# Patient Record
Sex: Female | Born: 1945
Health system: Southern US, Community
[De-identification: ages and names within clinical notes are randomized; demographics above are authoritative.]

## PROBLEM LIST (undated history)

## (undated) DIAGNOSIS — R0789 Other chest pain: Secondary | ICD-10-CM

## (undated) DIAGNOSIS — R011 Cardiac murmur, unspecified: Secondary | ICD-10-CM

## (undated) DIAGNOSIS — T7840XA Allergy, unspecified, initial encounter: Secondary | ICD-10-CM

## (undated) DIAGNOSIS — R911 Solitary pulmonary nodule: Secondary | ICD-10-CM

## (undated) DIAGNOSIS — G43909 Migraine, unspecified, not intractable, without status migrainosus: Secondary | ICD-10-CM

## (undated) DIAGNOSIS — M8580 Other specified disorders of bone density and structure, unspecified site: Secondary | ICD-10-CM

## (undated) HISTORY — DX: Allergy, unspecified, initial encounter: T78.40XA

## (undated) HISTORY — DX: Other specified disorders of bone density and structure, unspecified site: M85.80

## (undated) HISTORY — DX: Cardiac murmur, unspecified: R01.1

## (undated) HISTORY — PX: COLONOSCOPY: SHX174

## (undated) HISTORY — DX: Migraine, unspecified, not intractable, without status migrainosus: G43.909

---

## 1998-12-20 ENCOUNTER — Other Ambulatory Visit: Admission: RE | Admit: 1998-12-20 | Discharge: 1998-12-20 | Payer: Self-pay | Admitting: Obstetrics and Gynecology

## 2000-08-16 ENCOUNTER — Emergency Department (HOSPITAL_COMMUNITY): Admission: EM | Admit: 2000-08-16 | Discharge: 2000-08-16 | Payer: Self-pay | Admitting: Emergency Medicine

## 2000-08-16 ENCOUNTER — Encounter: Payer: Self-pay | Admitting: Emergency Medicine

## 2000-10-29 ENCOUNTER — Encounter: Admission: RE | Admit: 2000-10-29 | Discharge: 2000-10-29 | Payer: Self-pay | Admitting: *Deleted

## 2000-10-29 ENCOUNTER — Encounter: Payer: Self-pay | Admitting: *Deleted

## 2001-03-27 ENCOUNTER — Encounter: Payer: Self-pay | Admitting: Family Medicine

## 2001-03-27 LAB — HM COLONOSCOPY: HM Colonoscopy: NORMAL

## 2001-11-01 ENCOUNTER — Encounter: Payer: Self-pay | Admitting: *Deleted

## 2001-11-01 ENCOUNTER — Encounter: Admission: RE | Admit: 2001-11-01 | Discharge: 2001-11-01 | Payer: Self-pay | Admitting: *Deleted

## 2003-10-15 ENCOUNTER — Encounter: Admission: RE | Admit: 2003-10-15 | Discharge: 2003-10-15 | Payer: Self-pay | Admitting: Family Medicine

## 2005-02-15 ENCOUNTER — Encounter: Admission: RE | Admit: 2005-02-15 | Discharge: 2005-02-15 | Payer: Self-pay | Admitting: Family Medicine

## 2005-02-22 ENCOUNTER — Ambulatory Visit: Payer: Self-pay | Admitting: *Deleted

## 2005-02-24 ENCOUNTER — Ambulatory Visit: Payer: Self-pay

## 2005-03-03 ENCOUNTER — Ambulatory Visit: Payer: Self-pay

## 2005-03-08 ENCOUNTER — Ambulatory Visit: Payer: Self-pay | Admitting: *Deleted

## 2006-01-08 ENCOUNTER — Ambulatory Visit: Payer: Self-pay | Admitting: Family Medicine

## 2006-01-15 ENCOUNTER — Other Ambulatory Visit: Admission: RE | Admit: 2006-01-15 | Discharge: 2006-01-15 | Payer: Self-pay | Admitting: Family Medicine

## 2006-01-15 ENCOUNTER — Encounter: Payer: Self-pay | Admitting: Family Medicine

## 2006-01-15 ENCOUNTER — Ambulatory Visit: Payer: Self-pay | Admitting: Family Medicine

## 2006-09-13 ENCOUNTER — Ambulatory Visit (HOSPITAL_COMMUNITY): Admission: RE | Admit: 2006-09-13 | Discharge: 2006-09-13 | Payer: Self-pay | Admitting: Otolaryngology

## 2008-01-10 ENCOUNTER — Encounter: Admission: RE | Admit: 2008-01-10 | Discharge: 2008-01-10 | Payer: Self-pay | Admitting: Family Medicine

## 2009-02-24 ENCOUNTER — Encounter: Admission: RE | Admit: 2009-02-24 | Discharge: 2009-02-24 | Payer: Self-pay | Admitting: Family Medicine

## 2009-12-31 ENCOUNTER — Encounter: Payer: Self-pay | Admitting: Family Medicine

## 2010-01-13 ENCOUNTER — Ambulatory Visit: Payer: Self-pay | Admitting: Family Medicine

## 2010-01-13 ENCOUNTER — Other Ambulatory Visit: Admission: RE | Admit: 2010-01-13 | Discharge: 2010-01-13 | Payer: Self-pay | Admitting: Family Medicine

## 2010-01-13 DIAGNOSIS — G43109 Migraine with aura, not intractable, without status migrainosus: Secondary | ICD-10-CM | POA: Insufficient documentation

## 2010-01-13 DIAGNOSIS — J309 Allergic rhinitis, unspecified: Secondary | ICD-10-CM | POA: Insufficient documentation

## 2010-01-14 ENCOUNTER — Ambulatory Visit: Payer: Self-pay | Admitting: Family Medicine

## 2010-01-17 ENCOUNTER — Encounter: Payer: Self-pay | Admitting: Family Medicine

## 2010-01-17 LAB — CONVERTED CEMR LAB
AST: 24 units/L (ref 0–37)
Alkaline Phosphatase: 59 units/L (ref 39–117)
BUN: 13 mg/dL (ref 6–23)
Basophils Relative: 0.5 % (ref 0.0–3.0)
CO2: 28 meq/L (ref 19–32)
Cholesterol: 180 mg/dL (ref 0–200)
Creatinine, Ser: 0.5 mg/dL (ref 0.4–1.2)
Glucose, Bld: 82 mg/dL (ref 70–99)
HDL: 54 mg/dL (ref >39.00)
Hemoglobin: 13.9 g/dL (ref 12.0–15.0)
LDL Cholesterol: 115 mg/dL — ABNORMAL HIGH (ref 0–99)
Neutrophils Relative %: 53.9 % (ref 43.0–77.0)
Potassium: 3.9 meq/L (ref 3.5–5.1)
TSH: 1.95 microintl units/mL (ref 0.35–5.50)
VLDL: 11.2 mg/dL (ref 0.0–40.0)

## 2010-01-18 ENCOUNTER — Encounter (INDEPENDENT_AMBULATORY_CARE_PROVIDER_SITE_OTHER): Payer: Self-pay | Admitting: *Deleted

## 2010-01-24 ENCOUNTER — Ambulatory Visit: Payer: Self-pay | Admitting: Family Medicine

## 2010-01-24 ENCOUNTER — Encounter (INDEPENDENT_AMBULATORY_CARE_PROVIDER_SITE_OTHER): Payer: Self-pay | Admitting: *Deleted

## 2010-01-25 LAB — CONVERTED CEMR LAB: Fecal Occult Bld: NEGATIVE

## 2010-03-02 ENCOUNTER — Encounter: Payer: Self-pay | Admitting: Family Medicine

## 2010-03-02 ENCOUNTER — Encounter: Admission: RE | Admit: 2010-03-02 | Discharge: 2010-03-02 | Payer: Self-pay | Admitting: Family Medicine

## 2010-03-02 LAB — HM MAMMOGRAPHY: HM Mammogram: NEGATIVE

## 2010-04-01 ENCOUNTER — Telehealth (INDEPENDENT_AMBULATORY_CARE_PROVIDER_SITE_OTHER): Payer: Self-pay | Admitting: *Deleted

## 2010-10-22 ENCOUNTER — Encounter: Payer: Self-pay | Admitting: Family Medicine

## 2010-10-23 ENCOUNTER — Encounter: Payer: Self-pay | Admitting: Family Medicine

## 2010-11-01 NOTE — Progress Notes (Signed)
Summary: BMD Results  Phone Note Call from Patient Call back at Home Phone [phone]   Caller: Patient Summary of Call: Patient called and left a message on the triage line asking about her t-score from her BMD. She also wanted to know what route she should take for the treatment of her osteroporosis. Please advise.  Initial call taken by: Harold Barban,  April 01, 2010 2:21 PM  Follow-up for Phone Call        see BMD--- options were discussed with pt when she was called with bmd results.   Lumbar spine T score--- -2.0 osteoporosis and in femur -1.4  osteopenia Follow-up by: Loreen Freud DO,  April 04, 2010 9:00 AM  Additional Follow-up for Phone Call Additional follow up Details #1::        left message for pt to call back. Army Fossa CMA  April 05, 2010 10:07 AM     Additional Follow-up for Phone Call Additional follow up Details #2::    Pt states she has researched the opitions and she does not want to do Prolia. She states she is allergic to the CT dye and it states not to take if allergic. She would like to also know what Actonel was the choice over Fosamax, since Fosamax has a generic. Army Fossa CMA  April 06, 2010 12:55 PM   Additional Follow-up for Phone Call Additional follow up Details #3:: Details for Additional Follow-up Action Taken: I've had pt have more problems with fosamax since it is generic----but she can try that one if prefers 70mg  weekly  #4 11 refills also actonel 150 is only 1x a month  Pt decied to use Actonel, this is safe to use with her allergy to the dye correct? Army Fossa CMA  April 06, 2010 1:16 PM  correct  7.6.2011 yrlowne 240p Additional Follow-up by: Loreen Freud DO,  April 06, 2010 12:58 PM  New/Updated Medications: ACTONEL 150 MG TABS (RISEDRONATE SODIUM) 1 by mouth once monthly. Prescriptions: ACTONEL 150 MG TABS (RISEDRONATE SODIUM) 1 by mouth once monthly.  #1 x 11   Entered by:   Army Fossa CMA   Authorized by:    Loreen Freud DO   Signed by:   Army Fossa CMA on 04/06/2010   Method used:   Electronically to        [id number] (retail)       [address]       [place], [place]  [address]       Ph: [phone]       Fax: [phone]   RxID:   [id number]

## 2010-11-01 NOTE — Assessment & Plan Note (Signed)
Summary: NEW PT/BCBS/KDC   Vital Signs:  Patient profile:   65 year old female Height:      66 inches Weight:      169 pounds BMI:     27.38 Pulse rate:   62 / minute Pulse rhythm:   regular BP sitting:   120 / 76  (left arm) Cuff size:   regular  Vitals Entered By: Army Fossa CMA (January 13, 2010 2:14 PM)  History of Present Illness: Pt here for for cpe, and pap---labs to be done another day.    Preventive Screening-Counseling & Management  Alcohol-Tobacco     Alcohol drinks/day: <1     Alcohol type: wine     Smoking Status: never  Caffeine-Diet-Exercise     Caffeine use/day: 3     Does Patient Exercise: yes     Type of exercise: tennis, walk, hike     Exercise (avg: min/session): 30-60     Times/week: 7     Exercise Counseling: not indicated; exercise is adequate  Hep-HIV-STD-Contraception     Dental Visit-last 6 months yes     Dental Care Counseling: not indicated; dental care within six months     SBE monthly: yes     SBE Education/Counseling: not indicated; SBE done regularly      Sexual History:  currently monogamous and married.        Drug Use:  no.    Current Medications (verified): 1)  Allegra 180 Mg Tabs (Fexofenadine Hcl) .Marland Kitchen.. 1 By Mouth Daily 2)  Osteo Bi-Flex Adv Triple St  Tabs (Misc Natural Products) 3)  Fish Oil 4)  Vitamin C 5)  Excedrin Migraine 250-250-65 Mg Tabs (Aspirin-Acetaminophen-Caffeine)  Allergies (verified): 1)  ! * Ivp Dye  Past History:  Family History: Last updated: 01/13/2010 Heart disease--  CHF Colon Cancer Family History of CAD Female 1st degree relative <50  Social History: Last updated: 01/13/2010 Married Never Smoked Alcohol use-yes Drug use-no Regular exercise-yes Occupation: unemployed  Risk Factors: Alcohol Use: <1 (01/13/2010) Caffeine Use: 3 (01/13/2010) Exercise: yes (01/13/2010)  Risk Factors: Smoking Status: never (01/13/2010)  Past Medical History: Heart Murmur   Vertigo Migraines Allergic rhinitis  Family History: Reviewed history and no changes required. Heart disease--  CHF Colon Cancer Family History of CAD Female 1st degree relative <50  Social History: Reviewed history and no changes required. Married Never Smoked Alcohol use-yes Drug use-no Regular exercise-yes Occupation: unemployed Smoking Status:  never Drug Use:  no Does Patient Exercise:  yes Caffeine use/day:  3 Dental Care w/in 6 mos.:  yes Sexual History:  currently monogamous, married Occupation:  employed  Review of Systems      See HPI General:  Denies chills, fatigue, fever, loss of appetite, malaise, sleep disorder, sweats, weakness, and weight loss. Eyes:  Denies blurring, discharge, double vision, eye irritation, eye pain, halos, itching, light sensitivity, red eye, vision loss-1 eye, and vision loss-both eyes; optho- q1y. ENT:  Denies decreased hearing, difficulty swallowing, ear discharge, earache, hoarseness, nasal congestion, nosebleeds, postnasal drainage, ringing in ears, sinus pressure, and sore throat; Dr Jettie Booze frequent om and vertigo. CV:  Denies bluish discoloration of lips or nails, chest pain or discomfort, difficulty breathing at night, difficulty breathing while lying down, fainting, fatigue, leg cramps with exertion, lightheadness, near fainting, palpitations, shortness of breath with exertion, swelling of feet, swelling of hands, and weight gain. Resp:  Denies chest discomfort, chest pain with inspiration, cough, coughing up blood, excessive snoring, hypersomnolence, morning headaches, pleuritic, shortness of  breath, sputum productive, and wheezing. GI:  Denies abdominal pain, bloody stools, change in bowel habits, constipation, dark tarry stools, diarrhea, excessive appetite, gas, hemorrhoids, indigestion, loss of appetite, nausea, vomiting, vomiting blood, and yellowish skin color. GU:  Denies abnormal vaginal bleeding, decreased libido,  discharge, dysuria, genital sores, hematuria, incontinence, nocturia, urinary frequency, and urinary hesitancy. MS:  Denies joint pain, joint redness, joint swelling, loss of strength, low back pain, mid back pain, muscle aches, muscle , cramps, muscle weakness, stiffness, and thoracic pain. Derm:  Denies changes in color of skin, changes in nail beds, dryness, excessive perspiration, flushing, hair loss, insect bite(s), itching, lesion(s), poor wound healing, and rash; derm q1y. Neuro:  Denies brief paralysis, difficulty with concentration, disturbances in coordination, falling down, headaches, inability to speak, memory loss, numbness, poor balance, seizures, sensation of room spinning, tingling, tremors, visual disturbances, and weakness. Psych:  Denies alternate hallucination ( auditory/visual), anxiety, depression, easily angered, easily tearful, irritability, mental problems, panic attacks, sense of great danger, suicidal thoughts/plans, thoughts of violence, unusual visions or sounds, and thoughts /plans of harming others. Endo:  Denies cold intolerance, excessive hunger, excessive thirst, excessive urination, heat intolerance, polyuria, and weight change. Heme:  Denies abnormal bruising, bleeding, enlarge lymph nodes, fevers, pallor, and skin discoloration. Allergy:  Denies hives or rash, itching eyes, persistent infections, seasonal allergies, and sneezing.  Physical Exam  General:  Well-developed,well-nourished,in no acute distress; alert,appropriate and cooperative throughout examination Head:  Normocephalic and atraumatic without obvious abnormalities. No apparent alopecia or balding. Eyes:  pupils equal, pupils round, pupils reactive to light, and no injection.   Ears:  External ear exam shows no significant lesions or deformities.  Otoscopic examination reveals clear canals, tympanic membranes are intact bilaterally without bulging, retraction, inflammation or discharge. Hearing is  grossly normal bilaterally. Nose:  External nasal examination shows no deformity or inflammation. Nasal mucosa are pink and moist without lesions or exudates. Mouth:  Oral mucosa and oropharynx without lesions or exudates.  Teeth in good repair. Neck:  No deformities, masses, or tenderness noted. Chest Wall:  No deformities, masses, or tenderness noted. Breasts:  No mass, nodules, thickening, tenderness, bulging, retraction, inflamation, nipple discharge or skin changes noted.   Lungs:  Normal respiratory effort, chest expands symmetrically. Lungs are clear to auscultation, no crackles or wheezes. Heart:  Normal rate and regular rhythm. S1 and S2 normal without gallop, murmur, click, rub or other extra sounds. Abdomen:  Bowel sounds positive,abdomen soft and non-tender without masses, organomegaly or hernias noted. Rectal:  No external abnormalities noted. Normal sphincter tone. No rectal masses or tenderness. Genitalia:  Pelvic Exam:        External: normal female genitalia without lesions or masses        Vagina: normal without lesions or masses        Cervix: normal without lesions or masses        Adnexa: normal bimanual exam without masses or fullness        Uterus: normal by palpation        Pap smear: performed Msk:  normal ROM, no joint tenderness, no joint swelling, no joint warmth, no redness over joints, no joint deformities, no joint instability, and no crepitation.   Pulses:  R dorsalis pedis normal, R carotid normal, L radial normal, L posterior tibial normal, L dorsalis pedis normal, and L carotid normal.   Extremities:  No clubbing, cyanosis, edema, or deformity noted with normal full range of motion of all joints.   Neurologic:  alert & oriented X3, strength normal in all extremities, and gait normal.   Skin:  Intact without suspicious lesions or rashes Cervical Nodes:  No lymphadenopathy noted Axillary Nodes:  No palpable lymphadenopathy Psych:  Cognition and judgment appear  intact. Alert and cooperative with normal attention span and concentration. No apparent delusions, illusions, hallucinations   Impression & Recommendations:  Problem # 1:  PREVENTIVE HEALTH CARE (ICD-V70.0)  check fasting labs check mammo and bmd ca supplementation sbe FOB  Orders: EKG w/ Interpretation (93000)  Problem # 2:  ALLERGIC RHINITIS (ICD-477.9)  Her updated medication list for this problem includes:    Allegra 180 Mg Tabs (Fexofenadine hcl) .Marland Kitchen... 1 by mouth daily  Complete Medication List: 1)  Allegra 180 Mg Tabs (Fexofenadine hcl) .Marland Kitchen.. 1 by mouth daily 2)  Osteo Bi-flex Adv Triple St Tabs (Misc natural products) 3)  Fish Oil  4)  Vitamin C  5)  Excedrin Migraine 250-250-65 Mg Tabs (Aspirin-acetaminophen-caffeine)  Patient Instructions: 1)  V70.0   fasting labs  cbcd , bmp, lipid hep, tsh,  UA   EKG  Procedure date:  01/13/2010  Findings:      Atrial fibrillation with a controlled ventricular response rate of: 100 later pulse was between 89-91   EKG  Procedure date:  01/13/2010  Findings:      Sinus bradycardia with rate of:    Colonoscopy Result Date:  03/27/2001 Colonoscopy Result:  normal Colonoscopy Next Due:  10 yr      Immunization History:  Tetanus/Td Immunization History:    Tetanus/Td:  Historical (01/17/2006)

## 2010-11-01 NOTE — Miscellaneous (Signed)
Summary: Orders Update   Clinical Lists Changes  Orders: Added new Referral order of Radiology Referral (Radiology) - Signed 

## 2010-11-01 NOTE — Letter (Signed)
Summary: Results Follow up Letter  [facility] [place]  [address] [place], [place] [address]   Phone: [phone]  Fax: [phone]    01/18/2010 MRN: [id number]  [facility] [place] [address] [place], [place]  [address]  Dear Terri Mendoza,  The following are the results of your recent test(s):  Test         Result    Pap Smear:        Normal __X___  Not Normal _____ Comments: ______________________________________________________ Cholesterol: LDL(Bad cholesterol):         Your goal is less than:         HDL (Good cholesterol):       Your goal is more than: Comments:  ______________________________________________________ Mammogram:        Normal _____  Not Normal _____ Comments:  ___________________________________________________________________ Hemoccult:        Normal _____  Not normal _______ Comments:    _____________________________________________________________________ Other Tests:    We routinely do not discuss normal results over the telephone.  If you desire a copy of the results, or you have any questions about this information we can discuss them at your next office visit.   Sincerely,    Army Fossa CMA  January 18, 2010 11:51 AM

## 2010-11-01 NOTE — Letter (Signed)
Summary: [facility]: Immunoassay Fecal Occult Blood (iFOB) Order Form  [facility] [place]  [address] [place], [place] [address]   Phone: [phone]  Fax: [phone]      [facility]: Immunoassay Fecal Occult Blood (iFOB) Order Form   January 24, 2010 MRN: [id number]   [facility] [employer] 1946/04/29   Physicican Name:__________lowne_____________  Diagnosis Code:_____________v76.51_____________      Terri Mendoza

## 2010-11-01 NOTE — Procedures (Signed)
Summary: Colonoscopy/[facility]  Colonoscopy/[facility]   Imported By: Lanelle Bal 01/19/2010 10:02:16  _____________________________________________________________________  External Attachment:    Type:   Image     Comment:   External Document

## 2011-03-22 ENCOUNTER — Other Ambulatory Visit: Payer: Self-pay | Admitting: Family Medicine

## 2011-03-22 DIAGNOSIS — Z1231 Encounter for screening mammogram for malignant neoplasm of breast: Secondary | ICD-10-CM

## 2011-03-31 ENCOUNTER — Ambulatory Visit
Admission: RE | Admit: 2011-03-31 | Discharge: 2011-03-31 | Disposition: A | Discharge status: Home / Self Care | Payer: BC Managed Care – PPO | Source: Ambulatory Visit | Attending: Family Medicine | Admitting: Family Medicine

## 2011-03-31 DIAGNOSIS — Z1231 Encounter for screening mammogram for malignant neoplasm of breast: Secondary | ICD-10-CM

## 2011-04-10 ENCOUNTER — Encounter: Payer: Self-pay | Admitting: Family Medicine

## 2011-04-11 ENCOUNTER — Ambulatory Visit (INDEPENDENT_AMBULATORY_CARE_PROVIDER_SITE_OTHER): Payer: BC Managed Care – PPO | Admitting: Family Medicine

## 2011-04-11 ENCOUNTER — Other Ambulatory Visit (HOSPITAL_COMMUNITY)
Admission: RE | Admit: 2011-04-11 | Discharge: 2011-04-11 | Disposition: A | Discharge status: Home / Self Care | Payer: BC Managed Care – PPO | Source: Ambulatory Visit | Attending: Family Medicine | Admitting: Family Medicine

## 2011-04-11 ENCOUNTER — Encounter: Payer: Self-pay | Admitting: Family Medicine

## 2011-04-11 VITALS — BP 108/70 | HR 62 | Temp 98.3°F | Ht 62.0 in | Wt 173.0 lb

## 2011-04-11 DIAGNOSIS — D239 Other benign neoplasm of skin, unspecified: Secondary | ICD-10-CM

## 2011-04-11 DIAGNOSIS — K6389 Other specified diseases of intestine: Secondary | ICD-10-CM

## 2011-04-11 DIAGNOSIS — R198 Other specified symptoms and signs involving the digestive system and abdomen: Secondary | ICD-10-CM

## 2011-04-11 DIAGNOSIS — Z01419 Encounter for gynecological examination (general) (routine) without abnormal findings: Secondary | ICD-10-CM | POA: Insufficient documentation

## 2011-04-11 DIAGNOSIS — D229 Melanocytic nevi, unspecified: Secondary | ICD-10-CM

## 2011-04-11 DIAGNOSIS — M810 Age-related osteoporosis without current pathological fracture: Secondary | ICD-10-CM

## 2011-04-11 DIAGNOSIS — Z0000 Encounter for general adult medical examination without abnormal findings: Secondary | ICD-10-CM

## 2011-04-11 DIAGNOSIS — Z136 Encounter for screening for cardiovascular disorders: Secondary | ICD-10-CM

## 2011-04-11 MED ORDER — RISEDRONATE SODIUM 150 MG PO TABS: 150.0000 mg | ORAL_TABLET | ORAL | Status: DC

## 2011-04-11 NOTE — Patient Instructions (Signed)

## 2011-04-11 NOTE — Progress Notes (Signed)
Subjective:     Terri Mendoza is a 65 y.o. female and is here for a comprehensive physical exam. The patient reports problems - cramps in legs. Pt also c/o multiple moles that have changed and she has seen Dr Nicholas Lose in the past.  Pt also c/o irregular BM and she is due for colonoscopy.  History   Social History  . Marital Status: Married    Spouse Name: N/A    Number of Children: N/A  . Years of Education: N/A   Occupational History  . Not on file.   Social History Main Topics  . Smoking status: Never Smoker   . Smokeless tobacco: Never Used  . Alcohol Use: Yes  . Drug Use: No  . Sexually Active: Yes -- Female partner(s)   Other Topics Concern  . Not on file   Social History Narrative  . No narrative on file   Health Maintenance  Topic Date Due  . Zostavax  07/01/2008  . Colonoscopy  03/28/2011  . Influenza Vaccine  07/03/2011  . Pap Smear  01/13/2013  . Mammogram  03/30/2013  . Tetanus/tdap  01/18/2016    The following portions of the patient's history were reviewed and updated as appropriate: allergies, current medications, past family history, past medical history, past social history, past surgical history and problem list.  Review of Systems Review of Systems  Constitutional: Negative for activity change, appetite change and fatigue.  HENT: Negative for hearing loss, congestion, tinnitus and ear discharge.  dentist q93m Eyes: Negative for visual disturbance (see optho q1y -- vision corrected to 20/20 with glasses).  Respiratory: Negative for cough, chest tightness and shortness of breath.   Cardiovascular: Negative for chest pain, palpitations and leg swelling.  Gastrointestinal: Negative for abdominal pain, diarrhea, constipation and abdominal distention.  Genitourinary: Negative for urgency, frequency, decreased urine volume and difficulty urinating.  Musculoskeletal: Negative for back pain, arthralgias and gait problem.  Skin: Negative for color change, pallor  and rash.  Neurological: Negative for dizziness, light-headedness, numbness and headaches.  Hematological: Negative for adenopathy. Does not bruise/bleed easily.  Psychiatric/Behavioral: Negative for suicidal ideas, confusion, sleep disturbance, self-injury, dysphoric mood, decreased concentration and agitation.       Objective:    BP 108/70  Pulse 62  Temp(Src) 98.3 F (36.8 C) (Oral)  Ht 5\' 2"  (1.575 m)  Wt 173 lb (78.472 kg)  BMI 31.64 kg/m2  SpO2 97% General appearance: alert, cooperative, appears stated age and no distress Head: Normocephalic, without obvious abnormality, atraumatic Eyes: conjunctivae/corneas clear. PERRL, EOM's intact. Fundi benign. Ears: normal TM's and external ear canals both ears Nose: Nares normal. Septum midline. Mucosa normal. No drainage or sinus tenderness. Throat: lips, mucosa, and tongue normal; teeth and gums normal Neck: no adenopathy, no carotid bruit, no JVD, supple, symmetrical, trachea midline and thyroid not enlarged, symmetric, no tenderness/mass/nodules Back: symmetric, no curvature. ROM normal. No CVA tenderness. Lungs: clear to auscultation bilaterally Breasts: normal appearance, no masses or tenderness Heart: regular rate and rhythm, S1, S2 normal, no murmur, click, rub or gallop Abdomen: soft, non-tender; bowel sounds normal; no masses,  no organomegaly Pelvic: cervix normal in appearance, external genitalia normal, no adnexal masses or tenderness, no cervical motion tenderness, rectovaginal septum normal, uterus normal size, shape, and consistency and vagina normal without discharge Extremities: extremities normal, atraumatic, no cyanosis or edema Pulses: 2+ and symmetric Skin: mult Sk,  Changing in size and color per pt Lymph nodes: Cervical, supraclavicular, and axillary nodes normal. Neurologic: Grossly normal  psych--no depression, no anxiety    Assessment:    Healthy female exam.   Plan:    check fasting labs ghm  utd See After Visit Summary for Counseling Recommendations

## 2011-04-12 ENCOUNTER — Encounter: Payer: Self-pay | Admitting: Gastroenterology

## 2011-04-26 ENCOUNTER — Other Ambulatory Visit: Payer: Self-pay | Admitting: Family Medicine

## 2011-04-27 ENCOUNTER — Other Ambulatory Visit (INDEPENDENT_AMBULATORY_CARE_PROVIDER_SITE_OTHER): Payer: BC Managed Care – PPO

## 2011-04-27 DIAGNOSIS — Z0000 Encounter for general adult medical examination without abnormal findings: Secondary | ICD-10-CM

## 2011-04-27 DIAGNOSIS — M810 Age-related osteoporosis without current pathological fracture: Secondary | ICD-10-CM

## 2011-04-27 LAB — CBC WITH DIFFERENTIAL/PLATELET
Basophils Absolute: 0 10*3/uL (ref 0.0–0.1)
Basophils Relative: 1 % (ref 0.0–3.0)
Eosinophils Absolute: 0.1 10*3/uL (ref 0.0–0.7)
Eosinophils Relative: 1.2 % (ref 0.0–5.0)
HCT: 44.7 % (ref 36.0–46.0)
Hemoglobin: 15.1 g/dL — ABNORMAL HIGH (ref 12.0–15.0)
Lymphs Abs: 1.3 10*3/uL (ref 0.7–4.0)
MCHC: 33.6 g/dL (ref 30.0–36.0)
MCV: 93.2 fl (ref 78.0–100.0)
Monocytes Absolute: 0.3 10*3/uL (ref 0.1–1.0)
Monocytes Relative: 7.1 % (ref 3.0–12.0)
Neutro Abs: 2.9 10*3/uL (ref 1.4–7.7)
Neutrophils Relative %: 63.4 % (ref 43.0–77.0)
RBC: 4.8 Mil/uL (ref 3.87–5.11)
WBC: 4.6 10*3/uL (ref 4.5–10.5)

## 2011-04-27 LAB — HEPATIC FUNCTION PANEL
Albumin: 4.8 g/dL (ref 3.5–5.2)
Alkaline Phosphatase: 58 U/L (ref 39–117)
Bilirubin, Direct: 0.1 mg/dL (ref 0.0–0.3)
Total Protein: 8.2 g/dL (ref 6.0–8.3)

## 2011-04-27 LAB — BASIC METABOLIC PANEL
Chloride: 103 mEq/L (ref 96–112)
GFR: 135.65 mL/min (ref >60.00)
Glucose, Bld: 82 mg/dL (ref 70–99)
Sodium: 140 mEq/L (ref 135–145)

## 2011-04-27 LAB — LIPID PANEL
Triglycerides: 49 mg/dL (ref 0.0–149.0)
VLDL: 9.8 mg/dL (ref 0.0–40.0)

## 2011-04-27 NOTE — Progress Notes (Signed)
Labs only

## 2011-05-05 ENCOUNTER — Ambulatory Visit: Payer: BC Managed Care – PPO | Admitting: Gastroenterology

## 2011-07-28 ENCOUNTER — Telehealth: Payer: Self-pay | Admitting: *Deleted

## 2011-07-28 MED ORDER — RISEDRONATE SODIUM 150 MG PO TABS: 150.0000 mg | ORAL_TABLET | ORAL | Status: DC

## 2011-07-28 NOTE — Telephone Encounter (Signed)
PA required for Actonel. Called PA # [phone] (15 min on phone). Approved x 1 year starting today. New RX sent into the pharm.

## 2011-08-07 ENCOUNTER — Encounter: Payer: Self-pay | Admitting: Family Medicine

## 2011-08-07 ENCOUNTER — Ambulatory Visit (INDEPENDENT_AMBULATORY_CARE_PROVIDER_SITE_OTHER): Payer: BC Managed Care – PPO | Admitting: Family Medicine

## 2011-08-07 VITALS — BP 110/70 | HR 67 | Temp 98.6°F | Wt 174.2 lb

## 2011-08-07 DIAGNOSIS — Z23 Encounter for immunization: Secondary | ICD-10-CM

## 2011-08-07 DIAGNOSIS — Z9109 Other allergy status, other than to drugs and biological substances: Secondary | ICD-10-CM

## 2011-08-07 DIAGNOSIS — E049 Nontoxic goiter, unspecified: Secondary | ICD-10-CM

## 2011-08-07 DIAGNOSIS — R42 Dizziness and giddiness: Secondary | ICD-10-CM

## 2011-08-07 DIAGNOSIS — Z889 Allergy status to unspecified drugs, medicaments and biological substances status: Secondary | ICD-10-CM

## 2011-08-07 MED ORDER — EPINEPHRINE 0.3 MG/0.3ML IJ DEVI: 0.3000 mg | Freq: Once | INTRAMUSCULAR | Status: DC

## 2011-08-07 MED ORDER — MECLIZINE HCL 25 MG PO CHEW: CHEWABLE_TABLET | ORAL | Status: DC

## 2011-08-07 NOTE — Progress Notes (Signed)
Addended by: Lelon Perla on: 08/07/2011 04:38 PM   Modules accepted: Orders

## 2011-08-07 NOTE — Progress Notes (Signed)
Addended by: Arnette Norris on: 08/07/2011 05:34 PM   Modules accepted: Orders

## 2011-08-07 NOTE — Patient Instructions (Signed)

## 2011-08-07 NOTE — Progress Notes (Signed)
  Subjective:    Patient ID: Terri Mendoza, female    DOB: 08/22/1946, 65 y.o.   MRN: [id number]  HPI  Pt here c/o fullness noticed in neck that comes and goes. She also notices weight gain and fatigue and would like her thyroid checked.   No other complaints  Review of Systems As above    Objective:   Physical Exam  Constitutional: She appears well-developed and well-nourished.  Neck: Normal range of motion. Neck supple. No tracheal deviation present. Thyromegaly present.  Lymphadenopathy:    She has no cervical adenopathy.  Psychiatric: She has a normal mood and affect. Her behavior is normal. Judgment and thought content normal.          Assessment & Plan:  Goiter?--- check US thyroid and thyroid panal                  Rto prn

## 2011-08-08 ENCOUNTER — Telehealth: Payer: Self-pay | Admitting: Family Medicine

## 2011-08-08 LAB — THYROID ANTIBODIES: Thyroperoxidase Ab SerPl-aCnc: <10 IU/mL (ref <35.0)

## 2011-08-08 NOTE — Telephone Encounter (Signed)
I'd have to see them to know what to call in.  She can use antihistamine and nasal spray (flonase, nasonex) ---we can call that in if she doesn't have it.

## 2011-08-08 NOTE — Telephone Encounter (Signed)
Pt seen for goiter on yesterday but now has pain in ear. Pt advise OV would be needed, Pt states that she is leaving to go out of town tomorrow and she is unable to come in today. .Please advise

## 2011-08-08 NOTE — Telephone Encounter (Signed)
msg left to call the office     KP 

## 2011-08-09 ENCOUNTER — Other Ambulatory Visit: Payer: Self-pay | Admitting: Family Medicine

## 2011-08-09 MED ORDER — MOMETASONE FUROATE 50 MCG/ACT NA SUSP: 2.0000 | Freq: Every day | NASAL | Status: DC

## 2011-08-09 NOTE — Telephone Encounter (Signed)
Discussed with patient and she voiced understanding, ok with starting a Nasal spray. Prescription faxed    KP

## 2011-08-09 NOTE — Telephone Encounter (Signed)
Faxed.   KP 

## 2011-08-10 ENCOUNTER — Telehealth: Payer: Self-pay | Admitting: *Deleted

## 2011-08-10 NOTE — Telephone Encounter (Signed)
I thought she had tried flonase---please ask pt

## 2011-08-10 NOTE — Telephone Encounter (Signed)
Per insurance Pt must have tried and failed Flonase or nasarel before Nasonex can be approve due to Pt having a step therapy plan.Please advise

## 2011-08-10 NOTE — Telephone Encounter (Signed)
Pt indicated that she has not tried any other nasal spray and would like to hold off on changing to another one at this time.Marland Kitchen

## 2011-08-11 ENCOUNTER — Other Ambulatory Visit: Payer: BC Managed Care – PPO

## 2011-08-16 ENCOUNTER — Ambulatory Visit
Admission: RE | Admit: 2011-08-16 | Discharge: 2011-08-16 | Disposition: A | Discharge status: Home / Self Care | Payer: BC Managed Care – PPO | Source: Ambulatory Visit | Attending: Family Medicine | Admitting: Family Medicine

## 2011-08-16 DIAGNOSIS — E049 Nontoxic goiter, unspecified: Secondary | ICD-10-CM

## 2011-10-24 ENCOUNTER — Other Ambulatory Visit: Payer: Self-pay

## 2011-10-24 MED ORDER — RISEDRONATE SODIUM 150 MG PO TABS: 150.0000 mg | ORAL_TABLET | ORAL | Status: DC

## 2011-10-25 ENCOUNTER — Telehealth: Payer: Self-pay | Admitting: Family Medicine

## 2011-10-25 DIAGNOSIS — M810 Age-related osteoporosis without current pathological fracture: Secondary | ICD-10-CM

## 2011-10-25 DIAGNOSIS — M8580 Other specified disorders of bone density and structure, unspecified site: Secondary | ICD-10-CM | POA: Insufficient documentation

## 2011-10-25 MED ORDER — RISEDRONATE SODIUM 150 MG PO TABS: 150.0000 mg | ORAL_TABLET | ORAL | Status: DC

## 2011-10-25 NOTE — Telephone Encounter (Signed)
Patient dose not want to start anything new since she had to get use to the side effects. She stated she also did not want the generic. I called [facility] at [phone] to initiate PA....... PA approved, Rx resent to [facility] and patient has been made aware     KP

## 2011-10-25 NOTE — Telephone Encounter (Signed)
The pt called and stated her insurance company is requiring a prior auth on the rx Actonel 150mg    Thanks!

## 2011-10-25 NOTE — Telephone Encounter (Signed)
Can change to fosamax 70 weekly if she agrees

## 2012-02-23 ENCOUNTER — Other Ambulatory Visit: Payer: Self-pay | Admitting: Family Medicine

## 2012-02-23 DIAGNOSIS — Z1231 Encounter for screening mammogram for malignant neoplasm of breast: Secondary | ICD-10-CM

## 2012-04-08 ENCOUNTER — Ambulatory Visit: Payer: BC Managed Care – PPO

## 2012-04-29 ENCOUNTER — Ambulatory Visit
Admission: RE | Admit: 2012-04-29 | Discharge: 2012-04-29 | Disposition: A | Discharge status: Home / Self Care | Payer: BC Managed Care – PPO | Source: Ambulatory Visit | Attending: Family Medicine | Admitting: Family Medicine

## 2012-04-29 DIAGNOSIS — Z1231 Encounter for screening mammogram for malignant neoplasm of breast: Secondary | ICD-10-CM

## 2012-05-04 ENCOUNTER — Emergency Department (HOSPITAL_BASED_OUTPATIENT_CLINIC_OR_DEPARTMENT_OTHER)
Admission: EM | Admit: 2012-05-04 | Discharge: 2012-05-04 | Disposition: A | Discharge status: Home / Self Care | Payer: BC Managed Care – PPO | Attending: Emergency Medicine | Admitting: Emergency Medicine

## 2012-05-04 ENCOUNTER — Encounter (HOSPITAL_BASED_OUTPATIENT_CLINIC_OR_DEPARTMENT_OTHER): Payer: Self-pay | Admitting: *Deleted

## 2012-05-04 ENCOUNTER — Emergency Department (HOSPITAL_BASED_OUTPATIENT_CLINIC_OR_DEPARTMENT_OTHER): Payer: BC Managed Care – PPO

## 2012-05-04 DIAGNOSIS — S6000XA Contusion of unspecified finger without damage to nail, initial encounter: Secondary | ICD-10-CM

## 2012-05-04 DIAGNOSIS — X58XXXA Exposure to other specified factors, initial encounter: Secondary | ICD-10-CM | POA: Insufficient documentation

## 2012-05-04 DIAGNOSIS — M899 Disorder of bone, unspecified: Secondary | ICD-10-CM | POA: Insufficient documentation

## 2012-05-04 DIAGNOSIS — S6990XA Unspecified injury of unspecified wrist, hand and finger(s), initial encounter: Secondary | ICD-10-CM | POA: Insufficient documentation

## 2012-05-04 NOTE — ED Provider Notes (Signed)
History     CSN: [id number]  Arrival date & time 05/04/12  4696   First MD Initiated Contact with Patient 05/04/12 1840      Chief Complaint  Patient presents with  . Hand Injury    (Consider location/radiation/quality/duration/timing/severity/associated sxs/prior treatment) Patient is a 66 y.o. female presenting with hand injury.  Hand Injury    Finger and hand injury after dog leash wrapped around fingers earlier today.  Bruising and swelling noted with the right index finger most painful and swollen, but left third and fourth fingers and right 2 and 3 fingers also involved.  No sensory loss but decreased ability to flex right index finger. Past Medical History  Diagnosis Date  . Heart murmur   . Allergy   . Migraines   . Osteopenia     History reviewed. No pertinent past surgical history.  Family History  Problem Relation Age of Onset  . Heart failure      CHF  . Colon cancer    . Coronary artery disease    . Heart disease Mother 35    chf,  valve repair  . Cancer Mother 59    colon  . Heart disease Father 27    chf    History  Substance Use Topics  . Smoking status: Never Smoker   . Smokeless tobacco: Never Used  . Alcohol Use: Yes    OB History    Grav Para Term Preterm Abortions TAB SAB Ect Mult Living                  Review of Systems  All other systems reviewed and are negative.    Allergies  Review of patient's allergies indicates no known allergies.  Home Medications   Current Outpatient Rx  Name Route Sig Dispense Refill  . VITAMIN C 1000 MG PO TABS Oral Take 1,000 mg by mouth daily.     . OMEGA-3 FATTY ACIDS 1000 MG PO CAPS Oral Take 1 g by mouth daily.     . IBUPROFEN 200 MG PO TABS Oral Take 600 mg by mouth every 6 (six) hours as needed. For pain.    . OSTEO BI-FLEX ADV TRIPLE ST PO TABS Oral Take 1 tablet by mouth daily.       BP 125/56  Pulse 74  Temp 97.9 F (36.6 C) (Oral)  Resp 20  Ht 5\' 5"  (1.651 m)  Wt 152 lb  (68.947 kg)  BMI 25.29 kg/m2  SpO2 99%  Physical Exam  Nursing note and vitals reviewed. Constitutional: She appears well-developed and well-nourished.  Musculoskeletal:       Right hand: She exhibits tenderness and swelling. decreased sensation noted. Normal strength noted.       Hands:      Right index finger unable to completely flex due to swelling.     ED Course  Procedures (including critical care time)  Labs Reviewed - No data to display Dg Hand Complete Left  05/04/2012  *RADIOLOGY REPORT*  Clinical Data: Left hand pain following injury.  LEFT HAND - COMPLETE 3+ VIEW  Comparison: None  Findings: No evidence of acute fracture, subluxation or dislocation identified.  No radio-opaque foreign bodies are present.  No focal bony lesions are noted.  The joint spaces are unremarkable.  IMPRESSION: Unremarkable left hand.  Original Report Authenticated By: Rosendo Gros, M.D.   Dg Hand Complete Right  05/04/2012  *RADIOLOGY REPORT*  Clinical Data: Right hand injury and pain.  RIGHT HAND -  COMPLETE 3+ VIEW  Comparison: None  Findings: No evidence of acute fracture, subluxation or dislocation identified.  No radio-opaque foreign bodies are present.  No focal bony lesions are noted.  The joint spaces are unremarkable.  IMPRESSION: Unremarkable right hand.  Original Report Authenticated By: Rosendo Gros, M.D.     No diagnosis found.    MDM  Patient placed in splint.  Plan ice, elevation, ibuprofen and f/u pmd.   Hilario Quarry, MD 05/04/12 2010

## 2012-05-04 NOTE — ED Notes (Signed)
I wrapped patient's finger with cotton webril for padding, then finger splint secured by small webril and coban. I gave patient addtnl supplies for comfort measures.

## 2012-05-04 NOTE — ED Notes (Signed)
Pt states she was walking her dog and he got away from her. His leash got wrapped around her right index finger and hand. Presents with swelling and bruising to same.

## 2012-10-07 ENCOUNTER — Encounter: Payer: BC Managed Care – PPO | Admitting: Family Medicine

## 2012-12-10 ENCOUNTER — Encounter: Payer: BC Managed Care – PPO | Admitting: Family Medicine

## 2012-12-31 ENCOUNTER — Encounter: Payer: Self-pay | Admitting: Family Medicine

## 2012-12-31 ENCOUNTER — Ambulatory Visit (INDEPENDENT_AMBULATORY_CARE_PROVIDER_SITE_OTHER): Payer: BC Managed Care – PPO | Admitting: Family Medicine

## 2012-12-31 VITALS — BP 116/72 | HR 66 | Temp 98.2°F | Ht 65.5 in | Wt 163.8 lb

## 2012-12-31 DIAGNOSIS — R5383 Other fatigue: Secondary | ICD-10-CM

## 2012-12-31 DIAGNOSIS — M810 Age-related osteoporosis without current pathological fracture: Secondary | ICD-10-CM

## 2012-12-31 DIAGNOSIS — Z0000 Encounter for general adult medical examination without abnormal findings: Secondary | ICD-10-CM

## 2012-12-31 DIAGNOSIS — R5381 Other malaise: Secondary | ICD-10-CM

## 2012-12-31 NOTE — Progress Notes (Signed)
Subjective:     Terri Mendoza is a 67 y.o. female and is here for a comprehensive physical exam. The patient reports no problems.  History   Social History  . Marital Status: Married    Spouse Name: N/A    Number of Children: N/A  . Years of Education: N/A   Occupational History  . Not on file.   Social History Main Topics  . Smoking status: Never Smoker   . Smokeless tobacco: Never Used  . Alcohol Use: Yes  . Drug Use: No  . Sexually Active: Yes -- Female partner(s)   Other Topics Concern  . Not on file   Social History Narrative  . No narrative on file   Health Maintenance  Topic Date Due  . Influenza Vaccine  1945-10-18  . Colonoscopy  03/28/2011  . Pap Smear  04/10/2014  . Mammogram  04/29/2014  . Tetanus/tdap  01/18/2016  . Zostavax  Completed    The following portions of the patient's history were reviewed and updated as appropriate:  She  has a past medical history of Heart murmur; Allergy; Migraines; and Osteopenia. She  does not have any pertinent problems on file. She  has no past surgical history on file. Her family history includes Cancer (age of onset: 16) in her mother; Colon cancer in an unspecified family member; Coronary artery disease in an unspecified family member; Heart disease (age of onset: 65) in her father; Heart disease (age of onset: 4) in her mother; and Heart failure in an unspecified family member. She  reports that she has never smoked. She has never used smokeless tobacco. She reports that  drinks alcohol. She reports that she does not use illicit drugs. She has a current medication list which includes the following prescription(s): vitamin c, fish oil-omega-3 fatty acids, ibuprofen, and osteo bi-flex adv triple st. Current Outpatient Prescriptions on File Prior to Visit  Medication Sig Dispense Refill  . Ascorbic Acid (VITAMIN C) 1000 MG tablet Take 1,000 mg by mouth daily.       . fish oil-omega-3 fatty acids 1000 MG capsule  Take 1 g by mouth daily.       Marland Kitchen ibuprofen (ADVIL,MOTRIN) 200 MG tablet Take 600 mg by mouth every 6 (six) hours as needed. For pain.      . Misc Natural Products (OSTEO BI-FLEX ADV TRIPLE ST) TABS Take 1 tablet by mouth daily.        No current facility-administered medications on file prior to visit.   She has No Known Allergies..  Review of Systems Review of Systems  Constitutional: Negative for activity change, appetite change and fatigue.  HENT: Negative for hearing loss, congestion, tinnitus and ear discharge.  dentist q44m Eyes: Negative for visual disturbance (see optho q1y -- vision corrected to 20/20 with glasses).  Respiratory: Negative for cough, chest tightness and shortness of breath.   Cardiovascular: Negative for chest pain, palpitations and leg swelling.  Gastrointestinal: Negative for abdominal pain, diarrhea, constipation and abdominal distention.  Genitourinary: Negative for urgency, frequency, decreased urine volume and difficulty urinating.  Musculoskeletal: Negative for back pain, arthralgias and gait problem.  Skin: Negative for color change, pallor and rash.  Neurological: Negative for dizziness, light-headedness, numbness and headaches.  Hematological: Negative for adenopathy. Does not bruise/bleed easily.  Psychiatric/Behavioral: Negative for suicidal ideas, confusion, sleep disturbance, self-injury, dysphoric mood, decreased concentration and agitation.       Objective:    BP 116/72  Pulse 66  Temp(Src) 98.2 F (  36.8 C) (Oral)  Ht 5' 5.5" (1.664 m)  Wt 163 lb 12.8 oz (74.299 kg)  BMI 26.83 kg/m2  SpO2 97% General appearance: alert, cooperative, appears stated age and no distress Head: Normocephalic, without obvious abnormality, atraumatic Eyes: conjunctivae/corneas clear. PERRL, EOM's intact. Fundi benign. Ears: normal TM's and external ear canals both ears Nose: Nares normal. Septum midline. Mucosa normal. No drainage or sinus tenderness. Throat:  lips, mucosa, and tongue normal; teeth and gums normal Neck: no adenopathy, supple, symmetrical, trachea midline and thyroid not enlarged, symmetric, no tenderness/mass/nodules Back: symmetric, no curvature. ROM normal. No CVA tenderness. Lungs: clear to auscultation bilaterally Breasts: normal appearance, no masses or tenderness Heart: regular rate and rhythm, S1, S2 normal, no murmur, click, rub or gallop Abdomen: soft, non-tender; bowel sounds normal; no masses,  no organomegaly Pelvic: not indicated; post-menopausal, no abnormal Pap smears in past Extremities: extremities normal, atraumatic, no cyanosis or edema Pulses: 2+ and symmetric Skin: Patch dry scaly skin base of neck Lymph nodes: Cervical, supraclavicular, and axillary nodes normal. Neurologic: Alert and oriented X 3, normal strength and tone. Normal symmetric reflexes. Normal coordination and gait Psych-- no depression, no anxiety      Assessment:    Healthy female exam.      Plan:  Check labs ghm ---  bmd and colon to be scheduled.  Otherwise utd   See After Visit Summary for Counseling Recommendations

## 2012-12-31 NOTE — Patient Instructions (Addendum)
Preventive Care for Adults, Female A healthy lifestyle and preventive care can promote health and wellness. Preventive health guidelines for women include the following key practices.  A routine yearly physical is a good way to check with your caregiver about your health and preventive screening. It is a chance to share any concerns and updates on your health, and to receive a thorough exam.  Visit your dentist for a routine exam and preventive care every 6 months. Brush your teeth twice a day and floss once a day. Good oral hygiene prevents tooth decay and gum disease.  The frequency of eye exams is based on your age, health, family medical history, use of contact lenses, and other factors. Follow your caregiver's recommendations for frequency of eye exams.  Eat a healthy diet. Foods like vegetables, fruits, whole grains, low-fat dairy products, and lean protein foods contain the nutrients you need without too many calories. Decrease your intake of foods high in solid fats, added sugars, and salt. Eat the right amount of calories for you.Get information about a proper diet from your caregiver, if necessary.  Regular physical exercise is one of the most important things you can do for your health. Most adults should get at least 150 minutes of moderate-intensity exercise (any activity that increases your heart rate and causes you to sweat) each week. In addition, most adults need muscle-strengthening exercises on 2 or more days a week.  Maintain a healthy weight. The body mass index (BMI) is a screening tool to identify possible weight problems. It provides an estimate of body fat based on height and weight. Your caregiver can help determine your BMI, and can help you achieve or maintain a healthy weight.For adults 20 years and older:  A BMI below 18.5 is considered underweight.  A BMI of 18.5 to 24.9 is normal.  A BMI of 25 to 29.9 is considered overweight.  A BMI of 30 and above is  considered obese.  Maintain normal blood lipids and cholesterol levels by exercising and minimizing your intake of saturated fat. Eat a balanced diet with plenty of fruit and vegetables. Blood tests for lipids and cholesterol should begin at age 20 and be repeated every 5 years. If your lipid or cholesterol levels are high, you are over 50, or you are at high risk for heart disease, you may need your cholesterol levels checked more frequently.Ongoing high lipid and cholesterol levels should be treated with medicines if diet and exercise are not effective.  If you smoke, find out from your caregiver how to quit. If you do not use tobacco, do not start.  If you are pregnant, do not drink alcohol. If you are breastfeeding, be very cautious about drinking alcohol. If you are not pregnant and choose to drink alcohol, do not exceed 1 drink per day. One drink is considered to be 12 ounces (355 mL) of beer, 5 ounces (148 mL) of wine, or 1.5 ounces (44 mL) of liquor.  Avoid use of street drugs. Do not share needles with anyone. Ask for help if you need support or instructions about stopping the use of drugs.  High blood pressure causes heart disease and increases the risk of stroke. Your blood pressure should be checked at least every 1 to 2 years. Ongoing high blood pressure should be treated with medicines if weight loss and exercise are not effective.  If you are 55 to 65 years old, ask your caregiver if you should take aspirin to prevent strokes.  Diabetes   screening involves taking a blood sample to check your fasting blood sugar level. This should be done once every 3 years, after age 45, if you are within normal weight and without risk factors for diabetes. Testing should be considered at a younger age or be carried out more frequently if you are overweight and have at least 1 risk factor for diabetes.  Breast cancer screening is essential preventive care for women. You should practice "breast  self-awareness." This means understanding the normal appearance and feel of your breasts and may include breast self-examination. Any changes detected, no matter how small, should be reported to a caregiver. Women in their 20s and 30s should have a clinical breast exam (CBE) by a caregiver as part of a regular health exam every 1 to 3 years. After age 40, women should have a CBE every year. Starting at age 40, women should consider having a mammography (breast X-ray test) every year. Women who have a family history of breast cancer should talk to their caregiver about genetic screening. Women at a high risk of breast cancer should talk to their caregivers about having magnetic resonance imaging (MRI) and a mammography every year.  The Pap test is a screening test for cervical cancer. A Pap test can show cell changes on the cervix that might become cervical cancer if left untreated. A Pap test is a procedure in which cells are obtained and examined from the lower end of the uterus (cervix).  Women should have a Pap test starting at age 21.  Between ages 21 and 29, Pap tests should be repeated every 2 years.  Beginning at age 30, you should have a Pap test every 3 years as long as the past 3 Pap tests have been normal.  Some women have medical problems that increase the chance of getting cervical cancer. Talk to your caregiver about these problems. It is especially important to talk to your caregiver if a new problem develops soon after your last Pap test. In these cases, your caregiver may recommend more frequent screening and Pap tests.  The above recommendations are the same for women who have or have not gotten the vaccine for human papillomavirus (HPV).  If you had a hysterectomy for a problem that was not cancer or a condition that could lead to cancer, then you no longer need Pap tests. Even if you no longer need a Pap test, a regular exam is a good idea to make sure no other problems are  starting.  If you are between ages 65 and 70, and you have had normal Pap tests going back 10 years, you no longer need Pap tests. Even if you no longer need a Pap test, a regular exam is a good idea to make sure no other problems are starting.  If you have had past treatment for cervical cancer or a condition that could lead to cancer, you need Pap tests and screening for cancer for at least 20 years after your treatment.  If Pap tests have been discontinued, risk factors (such as a new sexual partner) need to be reassessed to determine if screening should be resumed.  The HPV test is an additional test that may be used for cervical cancer screening. The HPV test looks for the virus that can cause the cell changes on the cervix. The cells collected during the Pap test can be tested for HPV. The HPV test could be used to screen women aged 30 years and older, and should   be used in women of any age who have unclear Pap test results. After the age of 30, women should have HPV testing at the same frequency as a Pap test.  Colorectal cancer can be detected and often prevented. Most routine colorectal cancer screening begins at the age of 50 and continues through age 75. However, your caregiver may recommend screening at an earlier age if you have risk factors for colon cancer. On a yearly basis, your caregiver may provide home test kits to check for hidden blood in the stool. Use of a small camera at the end of a tube, to directly examine the colon (sigmoidoscopy or colonoscopy), can detect the earliest forms of colorectal cancer. Talk to your caregiver about this at age 50, when routine screening begins. Direct examination of the colon should be repeated every 5 to 10 years through age 75, unless early forms of pre-cancerous polyps or small growths are found.  Hepatitis C blood testing is recommended for all people born from 1945 through 1965 and any individual with known risks for hepatitis C.  Practice  safe sex. Use condoms and avoid high-risk sexual practices to reduce the spread of sexually transmitted infections (STIs). STIs include gonorrhea, chlamydia, syphilis, trichomonas, herpes, HPV, and human immunodeficiency virus (HIV). Herpes, HIV, and HPV are viral illnesses that have no cure. They can result in disability, cancer, and death. Sexually active women aged 25 and younger should be checked for chlamydia. Older women with new or multiple partners should also be tested for chlamydia. Testing for other STIs is recommended if you are sexually active and at increased risk.  Osteoporosis is a disease in which the bones lose minerals and strength with aging. This can result in serious bone fractures. The risk of osteoporosis can be identified using a bone density scan. Women ages 65 and over and women at risk for fractures or osteoporosis should discuss screening with their caregivers. Ask your caregiver whether you should take a calcium supplement or vitamin D to reduce the rate of osteoporosis.  Menopause can be associated with physical symptoms and risks. Hormone replacement therapy is available to decrease symptoms and risks. You should talk to your caregiver about whether hormone replacement therapy is right for you.  Use sunscreen with sun protection factor (SPF) of 30 or more. Apply sunscreen liberally and repeatedly throughout the day. You should seek shade when your shadow is shorter than you. Protect yourself by wearing long sleeves, pants, a wide-brimmed hat, and sunglasses year round, whenever you are outdoors.  Once a month, do a whole body skin exam, using a mirror to look at the skin on your back. Notify your caregiver of new moles, moles that have irregular borders, moles that are larger than a pencil eraser, or moles that have changed in shape or color.  Stay current with required immunizations.  Influenza. You need a dose every fall (or winter). The composition of the flu vaccine  changes each year, so being vaccinated once is not enough.  Pneumococcal polysaccharide. You need 1 to 2 doses if you smoke cigarettes or if you have certain chronic medical conditions. You need 1 dose at age 65 (or older) if you have never been vaccinated.  Tetanus, diphtheria, pertussis (Tdap, Td). Get 1 dose of Tdap vaccine if you are younger than age 65, are over 65 and have contact with an infant, are a healthcare worker, are pregnant, or simply want to be protected from whooping cough. After that, you need a Td   booster dose every 10 years. Consult your caregiver if you have not had at least 3 tetanus and diphtheria-containing shots sometime in your life or have a deep or dirty wound.  HPV. You need this vaccine if you are a woman age 26 or younger. The vaccine is given in 3 doses over 6 months.  Measles, mumps, rubella (MMR). You need at least 1 dose of MMR if you were born in 1957 or later. You may also need a second dose.  Meningococcal. If you are age 19 to 21 and a first-year college student living in a residence hall, or have one of several medical conditions, you need to get vaccinated against meningococcal disease. You may also need additional booster doses.  Zoster (shingles). If you are age 60 or older, you should get this vaccine.  Varicella (chickenpox). If you have never had chickenpox or you were vaccinated but received only 1 dose, talk to your caregiver to find out if you need this vaccine.  Hepatitis A. You need this vaccine if you have a specific risk factor for hepatitis A virus infection or you simply wish to be protected from this disease. The vaccine is usually given as 2 doses, 6 to 18 months apart.  Hepatitis B. You need this vaccine if you have a specific risk factor for hepatitis B virus infection or you simply wish to be protected from this disease. The vaccine is given in 3 doses, usually over 6 months. Preventive Services / Frequency Ages 19 to 39  Blood  pressure check.** / Every 1 to 2 years.  Lipid and cholesterol check.** / Every 5 years beginning at age 20.  Clinical breast exam.** / Every 3 years for women in their 20s and 30s.  Pap test.** / Every 2 years from ages 21 through 29. Every 3 years starting at age 30 through age 65 or 70 with a history of 3 consecutive normal Pap tests.  HPV screening.** / Every 3 years from ages 30 through ages 65 to 70 with a history of 3 consecutive normal Pap tests.  Hepatitis C blood test.** / For any individual with known risks for hepatitis C.  Skin self-exam. / Monthly.  Influenza immunization.** / Every year.  Pneumococcal polysaccharide immunization.** / 1 to 2 doses if you smoke cigarettes or if you have certain chronic medical conditions.  Tetanus, diphtheria, pertussis (Tdap, Td) immunization. / A one-time dose of Tdap vaccine. After that, you need a Td booster dose every 10 years.  HPV immunization. / 3 doses over 6 months, if you are 26 and younger.  Measles, mumps, rubella (MMR) immunization. / You need at least 1 dose of MMR if you were born in 1957 or later. You may also need a second dose.  Meningococcal immunization. / 1 dose if you are age 19 to 21 and a first-year college student living in a residence hall, or have one of several medical conditions, you need to get vaccinated against meningococcal disease. You may also need additional booster doses.  Varicella immunization.** / Consult your caregiver.  Hepatitis A immunization.** / Consult your caregiver. 2 doses, 6 to 18 months apart.  Hepatitis B immunization.** / Consult your caregiver. 3 doses usually over 6 months. Ages 40 to 64  Blood pressure check.** / Every 1 to 2 years.  Lipid and cholesterol check.** / Every 5 years beginning at age 20.  Clinical breast exam.** / Every year after age 40.  Mammogram.** / Every year beginning at age 40   and continuing for as long as you are in good health. Consult with your  caregiver.  Pap test.** / Every 3 years starting at age 30 through age 65 or 70 with a history of 3 consecutive normal Pap tests.  HPV screening.** / Every 3 years from ages 30 through ages 65 to 70 with a history of 3 consecutive normal Pap tests.  Fecal occult blood test (FOBT) of stool. / Every year beginning at age 50 and continuing until age 75. You may not need to do this test if you get a colonoscopy every 10 years.  Flexible sigmoidoscopy or colonoscopy.** / Every 5 years for a flexible sigmoidoscopy or every 10 years for a colonoscopy beginning at age 50 and continuing until age 75.  Hepatitis C blood test.** / For all people born from 1945 through 1965 and any individual with known risks for hepatitis C.  Skin self-exam. / Monthly.  Influenza immunization.** / Every year.  Pneumococcal polysaccharide immunization.** / 1 to 2 doses if you smoke cigarettes or if you have certain chronic medical conditions.  Tetanus, diphtheria, pertussis (Tdap, Td) immunization.** / A one-time dose of Tdap vaccine. After that, you need a Td booster dose every 10 years.  Measles, mumps, rubella (MMR) immunization. / You need at least 1 dose of MMR if you were born in 1957 or later. You may also need a second dose.  Varicella immunization.** / Consult your caregiver.  Meningococcal immunization.** / Consult your caregiver.  Hepatitis A immunization.** / Consult your caregiver. 2 doses, 6 to 18 months apart.  Hepatitis B immunization.** / Consult your caregiver. 3 doses, usually over 6 months. Ages 65 and over  Blood pressure check.** / Every 1 to 2 years.  Lipid and cholesterol check.** / Every 5 years beginning at age 20.  Clinical breast exam.** / Every year after age 40.  Mammogram.** / Every year beginning at age 40 and continuing for as long as you are in good health. Consult with your caregiver.  Pap test.** / Every 3 years starting at age 30 through age 65 or 70 with a 3  consecutive normal Pap tests. Testing can be stopped between 65 and 70 with 3 consecutive normal Pap tests and no abnormal Pap or HPV tests in the past 10 years.  HPV screening.** / Every 3 years from ages 30 through ages 65 or 70 with a history of 3 consecutive normal Pap tests. Testing can be stopped between 65 and 70 with 3 consecutive normal Pap tests and no abnormal Pap or HPV tests in the past 10 years.  Fecal occult blood test (FOBT) of stool. / Every year beginning at age 50 and continuing until age 75. You may not need to do this test if you get a colonoscopy every 10 years.  Flexible sigmoidoscopy or colonoscopy.** / Every 5 years for a flexible sigmoidoscopy or every 10 years for a colonoscopy beginning at age 50 and continuing until age 75.  Hepatitis C blood test.** / For all people born from 1945 through 1965 and any individual with known risks for hepatitis C.  Osteoporosis screening.** / A one-time screening for women ages 65 and over and women at risk for fractures or osteoporosis.  Skin self-exam. / Monthly.  Influenza immunization.** / Every year.  Pneumococcal polysaccharide immunization.** / 1 dose at age 65 (or older) if you have never been vaccinated.  Tetanus, diphtheria, pertussis (Tdap, Td) immunization. / A one-time dose of Tdap vaccine if you are over   65 and have contact with an infant, are a healthcare worker, or simply want to be protected from whooping cough. After that, you need a Td booster dose every 10 years.  Varicella immunization.** / Consult your caregiver.  Meningococcal immunization.** / Consult your caregiver.  Hepatitis A immunization.** / Consult your caregiver. 2 doses, 6 to 18 months apart.  Hepatitis B immunization.** / Check with your caregiver. 3 doses, usually over 6 months. ** Family history and personal history of risk and conditions may change your caregiver's recommendations. Document Released: 11/14/2001 Document Revised: 12/11/2011  Document Reviewed: 02/13/2011 [employer] Patient Information 2013 [employer].  

## 2013-01-01 ENCOUNTER — Encounter: Payer: Self-pay | Admitting: Internal Medicine

## 2013-01-02 ENCOUNTER — Other Ambulatory Visit (INDEPENDENT_AMBULATORY_CARE_PROVIDER_SITE_OTHER): Payer: BC Managed Care – PPO

## 2013-01-02 ENCOUNTER — Other Ambulatory Visit: Payer: BC Managed Care – PPO

## 2013-01-02 DIAGNOSIS — Z0000 Encounter for general adult medical examination without abnormal findings: Secondary | ICD-10-CM

## 2013-01-02 DIAGNOSIS — R5381 Other malaise: Secondary | ICD-10-CM

## 2013-01-02 LAB — CBC WITH DIFFERENTIAL/PLATELET
Basophils Relative: 0.4 % (ref 0.0–3.0)
Eosinophils Absolute: 0.1 10*3/uL (ref 0.0–0.7)
Eosinophils Relative: 1.7 % (ref 0.0–5.0)
HCT: 42.1 % (ref 36.0–46.0)
Hemoglobin: 14.1 g/dL (ref 12.0–15.0)
Lymphs Abs: 1.2 10*3/uL (ref 0.7–4.0)
MCHC: 33.5 g/dL (ref 30.0–36.0)
MCV: 91.2 fl (ref 78.0–100.0)
Monocytes Absolute: 0.4 10*3/uL (ref 0.1–1.0)
Neutro Abs: 2 10*3/uL (ref 1.4–7.7)
RBC: 4.62 Mil/uL (ref 3.87–5.11)
WBC: 3.7 10*3/uL — ABNORMAL LOW (ref 4.5–10.5)

## 2013-01-02 LAB — LIPID PANEL
Cholesterol: 190 mg/dL (ref 0–200)
LDL Cholesterol: 117 mg/dL — ABNORMAL HIGH (ref 0–99)
Total CHOL/HDL Ratio: 3
Triglycerides: 53 mg/dL (ref 0.0–149.0)
VLDL: 10.6 mg/dL (ref 0.0–40.0)

## 2013-01-02 LAB — HEPATIC FUNCTION PANEL
Albumin: 4.1 g/dL (ref 3.5–5.2)
Alkaline Phosphatase: 55 U/L (ref 39–117)
Total Protein: 7.1 g/dL (ref 6.0–8.3)

## 2013-01-02 LAB — BASIC METABOLIC PANEL
CO2: 28 mEq/L (ref 19–32)
Chloride: 102 mEq/L (ref 96–112)
Creatinine, Ser: 0.5 mg/dL (ref 0.4–1.2)
Potassium: 3.7 mEq/L (ref 3.5–5.1)

## 2013-01-07 LAB — POCT URINALYSIS DIPSTICK
Blood, UA: NEGATIVE
Ketones, UA: NEGATIVE
Protein, UA: NEGATIVE
Spec Grav, UA: <=1.005
Urobilinogen, UA: 0.2
pH, UA: 8

## 2013-01-28 ENCOUNTER — Encounter: Payer: Self-pay | Admitting: Internal Medicine

## 2013-02-20 ENCOUNTER — Encounter: Payer: BC Managed Care – PPO | Admitting: Internal Medicine

## 2013-03-24 ENCOUNTER — Ambulatory Visit (AMBULATORY_SURGERY_CENTER): Payer: BC Managed Care – PPO | Admitting: *Deleted

## 2013-03-24 ENCOUNTER — Encounter: Payer: Self-pay | Admitting: Internal Medicine

## 2013-03-24 VITALS — Ht 65.0 in | Wt 161.2 lb

## 2013-03-24 DIAGNOSIS — Z1211 Encounter for screening for malignant neoplasm of colon: Secondary | ICD-10-CM

## 2013-03-24 MED ORDER — NA SULFATE-K SULFATE-MG SULF 17.5-3.13-1.6 GM/177ML PO SOLN: ORAL | Status: DC

## 2013-03-25 ENCOUNTER — Telehealth: Payer: Self-pay

## 2013-03-25 MED ORDER — NA SULFATE-K SULFATE-MG SULF 17.5-3.13-1.6 GM/177ML PO SOLN: ORAL | Status: DC

## 2013-03-25 NOTE — Telephone Encounter (Signed)
Left message for patient to call.  I have put a suprep up front for pick up.  Her insurance would not cover the one sent in.

## 2013-04-09 ENCOUNTER — Telehealth: Payer: Self-pay

## 2013-04-09 NOTE — Telephone Encounter (Signed)
Left another message for Cambryn to come by and pick up her sample Suprep Kit for her upcoming colonoscopy 04/30/13 with Dr. Leone Payor.

## 2013-04-10 ENCOUNTER — Encounter: Payer: BC Managed Care – PPO | Admitting: Internal Medicine

## 2013-04-30 ENCOUNTER — Encounter: Payer: Self-pay | Admitting: Internal Medicine

## 2013-04-30 ENCOUNTER — Ambulatory Visit (AMBULATORY_SURGERY_CENTER): Payer: BC Managed Care – PPO | Admitting: Internal Medicine

## 2013-04-30 VITALS — BP 121/100 | HR 47 | Temp 97.2°F | Resp 23 | Ht 65.0 in | Wt 161.0 lb

## 2013-04-30 DIAGNOSIS — Z1211 Encounter for screening for malignant neoplasm of colon: Secondary | ICD-10-CM

## 2013-04-30 MED ORDER — SODIUM CHLORIDE 0.9 % IV SOLN: 500.0000 mL | INTRAVENOUS | Status: DC

## 2013-04-30 NOTE — Progress Notes (Signed)
Patient did not experience any of the following events: a burn prior to discharge; a fall within the facility; wrong site/side/patient/procedure/implant event; or a hospital transfer or hospital admission upon discharge from the facility. ([id number]) Patient did not have preoperative order for IV antibiotic SSI prophylaxis. ([id number])  

## 2013-04-30 NOTE — Patient Instructions (Addendum)
Discharge instructions given with verbal understanding. Normal exam. Resume previous medications. YOU HAD AN ENDOSCOPIC PROCEDURE TODAY AT THE [facility]: Refer to the procedure report that was given to you for any specific questions about what was found during the examination.  If the procedure report does not answer your questions, please call your gastroenterologist to clarify.  If you requested that your care partner not be given the details of your procedure findings, then the procedure report has been included in a sealed envelope for you to review at your convenience later.  YOU SHOULD EXPECT: Some feelings of bloating in the abdomen. Passage of more gas than usual.  Walking can help get rid of the air that was put into your GI tract during the procedure and reduce the bloating. If you had a lower endoscopy (such as a colonoscopy or flexible sigmoidoscopy) you may notice spotting of blood in your stool or on the toilet paper. If you underwent a bowel prep for your procedure, then you may not have a normal bowel movement for a few days.  DIET: Your first meal following the procedure should be a light meal and then it is ok to progress to your normal diet.  A half-sandwich or bowl of soup is an example of a good first meal.  Heavy or fried foods are harder to digest and may make you feel nauseous or bloated.  Likewise meals heavy in dairy and vegetables can cause extra gas to form and this can also increase the bloating.  Drink plenty of fluids but you should avoid alcoholic beverages for 24 hours.  ACTIVITY: Your care partner should take you home directly after the procedure.  You should plan to take it easy, moving slowly for the rest of the day.  You can resume normal activity the day after the procedure however you should NOT DRIVE or use heavy machinery for 24 hours (because of the sedation medicines used during the test).    SYMPTOMS TO REPORT IMMEDIATELY: A gastroenterologist  can be reached at any hour.  During normal business hours, 8:30 AM to 5:00 PM Monday through Friday, call [phone].  After hours and on weekends, please call the GI answering service at [phone] who will take a message and have the physician on call contact you.   Following lower endoscopy (colonoscopy or flexible sigmoidoscopy):  Excessive amounts of blood in the stool  Significant tenderness or worsening of abdominal pains  Swelling of the abdomen that is new, acute  Fever of 100F or higher  FOLLOW UP: If any biopsies were taken you will be contacted by phone or by letter within the next 1-3 weeks.  Call your gastroenterologist if you have not heard about the biopsies in 3 weeks.  Our staff will call the home number listed on your records the next business day following your procedure to check on you and address any questions or concerns that you may have at that time regarding the information given to you following your procedure. This is a courtesy call and so if there is no answer at the home number and we have not heard from you through the emergency physician on call, we will assume that you have returned to your regular daily activities without incident.  SIGNATURES/CONFIDENTIALITY: You and/or your care partner have signed paperwork which will be entered into your electronic medical record.  These signatures attest to the fact that that the information above on your After Visit Summary has been reviewed   and is understood.  Full responsibility of the confidentiality of this discharge information lies with you and/or your care-partner. 

## 2013-04-30 NOTE — Op Note (Signed)
[facility] [address] [place], [address]   COLONOSCOPY PROCEDURE REPORT  PATIENT: Terri, Mendoza  MR#: [id number] BIRTHDATE: December 07, 1945 , 66  yrs. old GENDER: Female ENDOSCOPIST: Iva Boop, MD, [facility] REFERRED OZ:HYQMVH Lowne, DO PROCEDURE DATE:  04/30/2013 PROCEDURE:   Colonoscopy, screening First Screening Colonoscopy - Avg.  risk and is 50 yrs.  old or older - No.  Prior Negative Screening - Now for repeat screening. 10 or more years since last screening  History of Adenoma - Now for follow-up colonoscopy & has been > or = to 3 yrs.  N/A ASA CLASS:   Class I INDICATIONS:Average risk patient for colorectal cancer. MEDICATIONS: propofol (Diprivan) 200mg  IV, MAC sedation, administered by CRNA, and These medications were titrated to patient response per physician's verbal order  DESCRIPTION OF PROCEDURE:   After the risks benefits and alternatives of the procedure were thoroughly explained, informed consent was obtained.  A digital rectal exam revealed no abnormalities of the rectum.   The LB [id number]  endoscope was introduced through the anus and advanced to the cecum, which was identified by both the appendix and ileocecal valve. No adverse events experienced.   The quality of the prep was excellent using Suprep  The instrument was then slowly withdrawn as the colon was fully examined.      COLON FINDINGS: A normal appearing cecum, ileocecal valve, and appendiceal orifice were identified.  The ascending, hepatic flexure, transverse, splenic flexure, descending, sigmoid colon and rectum appeared unremarkable.  No polyps or cancers were seen.   A right colon retroflexion was performed.  Retroflexed views revealed no abnormalities. The time to cecum=2 minutes 54 seconds. Withdrawal time=8 minutes 20 seconds.  The scope was withdrawn and the procedure completed. COMPLICATIONS: There were no complications.  ENDOSCOPIC  IMPRESSION: Normal colonoscopy- excellent prep - second screening  RECOMMENDATIONS: Repeat Colonscopy in 10 years.   eSigned:  Iva Boop, MD, [facility] 04/30/2013 2:52 PM   cc: Lelon Perla, DO and The Patient

## 2013-05-01 ENCOUNTER — Telehealth: Payer: Self-pay

## 2013-05-01 NOTE — Telephone Encounter (Signed)
  Follow up Call-  Call back number 04/30/2013  Post procedure Call Back phone  # [phone]  Permission to leave phone message Yes     Patient questions:  Do you have a fever, pain , or abdominal swelling? no Pain Score  0 *  Have you tolerated food without any problems? yes  Have you been able to return to your normal activities? yes  Do you have any questions about your discharge instructions: Diet   no Medications  no Follow up visit  no  Do you have questions or concerns about your Care? no  Actions: * If pain score is 4 or above: No action needed, pain <4.

## 2013-07-22 ENCOUNTER — Other Ambulatory Visit: Payer: Self-pay

## 2013-07-22 DIAGNOSIS — Z1231 Encounter for screening mammogram for malignant neoplasm of breast: Secondary | ICD-10-CM

## 2013-08-07 ENCOUNTER — Other Ambulatory Visit: Payer: Self-pay

## 2013-08-11 ENCOUNTER — Ambulatory Visit
Admission: RE | Admit: 2013-08-11 | Discharge: 2013-08-11 | Disposition: A | Discharge status: Home / Self Care | Payer: BC Managed Care – PPO | Source: Ambulatory Visit

## 2013-08-11 DIAGNOSIS — Z1231 Encounter for screening mammogram for malignant neoplasm of breast: Secondary | ICD-10-CM

## 2013-10-09 ENCOUNTER — Telehealth: Payer: Self-pay | Admitting: Family Medicine

## 2013-10-09 NOTE — Telephone Encounter (Signed)
Patient states that her daughter's OB doctor is recommending she get a Tdap injection for her daughter's baby. Please advise if okay.

## 2013-10-09 NOTE — Telephone Encounter (Signed)
Patient aware Tdap not due until 2017.     KP

## 2014-01-15 ENCOUNTER — Other Ambulatory Visit: Payer: Self-pay | Admitting: Family Medicine

## 2014-08-31 ENCOUNTER — Emergency Department (HOSPITAL_COMMUNITY): Payer: Medicare HMO

## 2014-08-31 ENCOUNTER — Observation Stay (HOSPITAL_COMMUNITY)
Admission: EM | Admit: 2014-08-31 | Discharge: 2014-09-02 | Disposition: A | Discharge status: Home / Self Care | Payer: Medicare HMO | Attending: Dermatology | Admitting: Dermatology

## 2014-08-31 ENCOUNTER — Encounter: Payer: Self-pay | Admitting: Family Medicine

## 2014-08-31 ENCOUNTER — Encounter (HOSPITAL_COMMUNITY): Payer: Self-pay | Admitting: Emergency Medicine

## 2014-08-31 ENCOUNTER — Telehealth: Payer: Self-pay | Admitting: Family Medicine

## 2014-08-31 ENCOUNTER — Ambulatory Visit (INDEPENDENT_AMBULATORY_CARE_PROVIDER_SITE_OTHER): Payer: Medicare HMO | Admitting: Family Medicine

## 2014-08-31 VITALS — BP 110/70 | HR 66 | Temp 97.9°F | Wt 166.0 lb

## 2014-08-31 DIAGNOSIS — R0600 Dyspnea, unspecified: Secondary | ICD-10-CM | POA: Diagnosis present

## 2014-08-31 DIAGNOSIS — R011 Cardiac murmur, unspecified: Secondary | ICD-10-CM | POA: Diagnosis not present

## 2014-08-31 DIAGNOSIS — M810 Age-related osteoporosis without current pathological fracture: Secondary | ICD-10-CM | POA: Diagnosis present

## 2014-08-31 DIAGNOSIS — Z79899 Other long term (current) drug therapy: Secondary | ICD-10-CM | POA: Insufficient documentation

## 2014-08-31 DIAGNOSIS — R05 Cough: Secondary | ICD-10-CM | POA: Insufficient documentation

## 2014-08-31 DIAGNOSIS — R0602 Shortness of breath: Secondary | ICD-10-CM | POA: Insufficient documentation

## 2014-08-31 DIAGNOSIS — R0789 Other chest pain: Secondary | ICD-10-CM

## 2014-08-31 DIAGNOSIS — R079 Chest pain, unspecified: Secondary | ICD-10-CM

## 2014-08-31 DIAGNOSIS — J309 Allergic rhinitis, unspecified: Secondary | ICD-10-CM | POA: Diagnosis present

## 2014-08-31 DIAGNOSIS — G43109 Migraine with aura, not intractable, without status migrainosus: Secondary | ICD-10-CM | POA: Diagnosis present

## 2014-08-31 DIAGNOSIS — M8580 Other specified disorders of bone density and structure, unspecified site: Secondary | ICD-10-CM | POA: Diagnosis present

## 2014-08-31 DIAGNOSIS — G43909 Migraine, unspecified, not intractable, without status migrainosus: Secondary | ICD-10-CM | POA: Insufficient documentation

## 2014-08-31 DIAGNOSIS — R0609 Other forms of dyspnea: Secondary | ICD-10-CM | POA: Diagnosis present

## 2014-08-31 HISTORY — DX: Solitary pulmonary nodule: R91.1

## 2014-08-31 HISTORY — DX: Other chest pain: R07.89

## 2014-08-31 LAB — CBC WITH DIFFERENTIAL/PLATELET
BASOS ABS: 0 10*3/uL (ref 0.0–0.1)
BASOS PCT: 1 % (ref 0–1)
EOS ABS: 0.1 10*3/uL (ref 0.0–0.7)
EOS PCT: 1 % (ref 0–5)
HCT: 45.4 % (ref 36.0–46.0)
Hemoglobin: 14.8 g/dL (ref 12.0–15.0)
LYMPHS PCT: 20 % (ref 12–46)
Lymphs Abs: 1.3 10*3/uL (ref 0.7–4.0)
MCH: 30 pg (ref 26.0–34.0)
MCHC: 32.6 g/dL (ref 30.0–36.0)
MCV: 92.1 fL (ref 78.0–100.0)
Monocytes Absolute: 0.6 10*3/uL (ref 0.1–1.0)
Monocytes Relative: 9 % (ref 3–12)
Neutro Abs: 4.7 10*3/uL (ref 1.7–7.7)
Neutrophils Relative %: 69 % (ref 43–77)
PLATELETS: 241 10*3/uL (ref 150–400)
RBC: 4.93 MIL/uL (ref 3.87–5.11)
RDW: 13 % (ref 11.5–15.5)
WBC: 6.8 10*3/uL (ref 4.0–10.5)

## 2014-08-31 LAB — TROPONIN I

## 2014-08-31 LAB — BASIC METABOLIC PANEL
ANION GAP: 16 — AB (ref 5–15)
BUN: 15 mg/dL (ref 6–23)
CHLORIDE: 100 meq/L (ref 96–112)
CO2: 24 mEq/L (ref 19–32)
CREATININE: 0.46 mg/dL — AB (ref 0.50–1.10)
Calcium: 10.1 mg/dL (ref 8.4–10.5)
Glucose, Bld: 95 mg/dL (ref 70–99)
POTASSIUM: 3.9 meq/L (ref 3.7–5.3)
Sodium: 140 mEq/L (ref 137–147)

## 2014-08-31 LAB — D-DIMER, QUANTITATIVE (NOT AT ARMC): D-Dimer, Quant: 1.8 ug/mL-FEU — ABNORMAL HIGH (ref 0.00–0.48)

## 2014-08-31 LAB — I-STAT TROPONIN, ED: Troponin i, poc: 0 ng/mL (ref 0.00–0.08)

## 2014-08-31 MED ORDER — ONDANSETRON HCL 4 MG/2ML IJ SOLN: 4.0000 mg | Freq: Four times a day (QID) | INTRAMUSCULAR | Status: DC | PRN

## 2014-08-31 MED ORDER — ASPIRIN EC 81 MG PO TBEC
81.0000 mg | DELAYED_RELEASE_TABLET | Freq: Every day | ORAL | Status: DC
  Administered 2014-09-01 – 2014-09-02 (×2): 81 mg via ORAL
  Filled 2014-08-31 (×2): qty 1

## 2014-08-31 MED ORDER — ASPIRIN 300 MG RE SUPP: 300.0000 mg | RECTAL | Status: AC

## 2014-08-31 MED ORDER — TECHNETIUM TO 99M ALBUMIN AGGREGATED
6.0000 | Freq: Once | INTRAVENOUS | Status: AC | PRN
  Administered 2014-08-31: 6 via INTRAVENOUS

## 2014-08-31 MED ORDER — NITROGLYCERIN 0.4 MG SL SUBL: 0.4000 mg | SUBLINGUAL_TABLET | SUBLINGUAL | Status: DC | PRN

## 2014-08-31 MED ORDER — ASPIRIN 81 MG PO CHEW
324.0000 mg | CHEWABLE_TABLET | ORAL | Status: AC
  Administered 2014-08-31: 324 mg via ORAL
  Filled 2014-08-31: qty 4

## 2014-08-31 MED ORDER — TECHNETIUM TC 99M DIETHYLENETRIAME-PENTAACETIC ACID: 40.0000 | Freq: Once | INTRAVENOUS | Status: AC | PRN

## 2014-08-31 MED ORDER — HEPARIN (PORCINE) IN NACL 100-0.45 UNIT/ML-% IJ SOLN
1100.0000 [IU]/h | INTRAMUSCULAR | Status: DC
  Administered 2014-08-31: 900 [IU]/h via INTRAVENOUS
  Administered 2014-09-01: 1100 [IU]/h via INTRAVENOUS
  Filled 2014-08-31 (×2): qty 250

## 2014-08-31 MED ORDER — HEPARIN BOLUS VIA INFUSION
4000.0000 [IU] | Freq: Once | INTRAVENOUS | Status: AC
  Administered 2014-08-31: 4000 [IU] via INTRAVENOUS
  Filled 2014-08-31: qty 4000

## 2014-08-31 MED ORDER — ACETAMINOPHEN 325 MG PO TABS
650.0000 mg | ORAL_TABLET | ORAL | Status: DC | PRN
  Administered 2014-08-31: 650 mg via ORAL
  Filled 2014-08-31: qty 2

## 2014-08-31 NOTE — Patient Instructions (Signed)
I spoke with cardiology who recommended you go to the emergency department at this time for further evaluation.

## 2014-08-31 NOTE — ED Notes (Signed)
Pt returned from [facility] 

## 2014-08-31 NOTE — Progress Notes (Signed)
  Garret Reddish, MD Phone: [phone]  Subjective:   Terri Mendoza is a 68 y.o. year old very pleasant female patient who presents with the following:  Shortness of Breath/chest tightness (concern for new onset angina) Very active person, hiking around weekend of 11/22 and noticed any incline would have to stop and catch breath before continuing. Used to hiking 20 miles per weekend and never noticed before. No chest tightness or pain at that time.   Played tennis Monday and Tuesday with shortness of breath. Tuesday she has chest tightness in left upper chest. States "not a pain" just mild tightness that has been gradually progressive like someone with hand on chest "pushing a little hard". Pain gets worse with playing tennis.  After played yesterday morning, breathing was very bad and would cough if took a deep breath. Moved very little during the match.   ROS- mild sore throat, dry cough. No fevers or chills. No lightheadedness/weakness. No nausea/vomiting. Low appetite.No increased diaphoresis.   Past Medical History- osteoporosis, migraines, seasonal allergies. No asthma history.  TSH normal in 2014, hyperlipidemia with LDL 117 but HDL 62.No diabetes off 2014 labs.   Family history- was told daughter has PE history and when genetically tested was told came from mother's side. Both mother and father had CHF. Father had a MI in his 63s. Brother had a stent at age 25 (nonsmoker), another with heart problems in his 29s -not sure of history.   Medications- reviewed and updated Current Outpatient Prescriptions  Medication Sig Dispense Refill  . Ascorbic Acid (VITAMIN C) 1000 MG tablet Take 1,000 mg by mouth daily.     Marland Kitchen EPIPEN 2-PAK 0.3 MG/0.3ML SOAJ injection INJECT 0.3 MLS INTO THE MUSCLE ONCE. 2 mL 0  . fish oil-omega-3 fatty acids 1000 MG capsule Take 1 g by mouth daily.     Marland Kitchen ibuprofen (ADVIL,MOTRIN) 200 MG tablet Take 600 mg by mouth every 6 (six) hours as needed. For pain.    .  Misc Natural Products (OSTEO BI-FLEX ADV TRIPLE ST) TABS Take 1 tablet by mouth daily.      No current facility-administered medications for this visit.    Objective: BP 110/70 mmHg  Pulse 66  Temp(Src) 97.9 F (36.6 C)  Wt 166 lb (75.297 kg)  SpO2 96% Gen: NAD, resting comfortably CV: RRR no murmurs rubs or gallops Lungs: CTAB no crackles, wheeze, rhonchi Abdomen: soft/nontender/nondistended/normal bowel sounds. No rebound or guarding.  Ext: no edema Skin: warm, dry Neuro: grossly normal, moves all extremities  EKG- Rate 67, sinus rhythm, normal axis and intervals. Wandering baseline makes interpretation of st-t wave changes difficult. ? flattening in III as well as ? Depression v4-v6 but in some complexes-appears normal. Overall-nonspecific st-t wave changes  Assessment/Plan:  Shortness of breath Patient with progressive shortness of breath and chest pain with exertion concerning for new onset angina in patient with Heart score of 5  (+2 history, +1 age, +1 nonspecific ekg, +1 HLD). Considered d-dimer, CXR and get patient into cardiology within 1-2 days. Spoke with Dr. Burt Knack who recommended patient go to the emergency room and have evaluation for PE (d-dimer and if positive VQ scan as contrast allergy) and cardiology will consult (he is informing the inpatient team). He states if pulmonary workup unrevealing, would plan for stress testing tomorrow.

## 2014-08-31 NOTE — ED Provider Notes (Signed)
CSN: [id number]     Arrival date & time 08/31/14  1211 History   First MD Initiated Contact with Patient 08/31/14 1249     Chief Complaint  Patient presents with  . Shortness of Breath   HPI Terri Mendoza is a 68 year old woman with history of migraines and osteopenia presenting with shortness of breath over the last 10 days.  She reports going hiking around that time and noticing increased trouble breathing from her baseline, having to stop and catch her breath after short inclines.  She also started noticing shortness of breath associated with some chest pressure in her left chest after playing tennis a week ago.  This has continued at rest throughout the week with worsening of her symptoms with exertion.  Yesterday morning after playing tennis, she reports her discomfort was worse, and she developed a dry cough with deep breaths.  She denies any long periods of immobility and reports no extremity pain or swelling.  She says that her daughter recently had multiple pulmonary embolisms and was told that she was homozygous for the factor V Leiden mutation. She saw her PCP today who was concerned about new angina vs. PE, so he sent her to the ER for d-dimer and possible VQ scan with plans for cardiology to consult and do stress testing tomorrow if PE work-up is negative.   Past Medical History  Diagnosis Date  . Heart murmur   . Allergy   . Migraines   . Osteopenia    Past Surgical History  Procedure Laterality Date  . Colonoscopy     Family History  Problem Relation Age of Onset  . Heart failure      CHF  . Coronary artery disease    . Heart disease Mother 47    chf,  valve repair  . Cancer Mother 73    colon  . Colon cancer Mother 12  . Heart disease Father 66    chf   History  Substance Use Topics  . Smoking status: Never Smoker   . Smokeless tobacco: Never Used  . Alcohol Use: 2.4 oz/week    4 Glasses of wine per week   OB History    No data available     Review  of Systems  Constitutional: Negative for fever, chills, appetite change and fatigue.  HENT: Negative for congestion, rhinorrhea and sore throat.   Respiratory: Positive for cough, chest tightness (Pressure.) and shortness of breath. Negative for apnea.   Cardiovascular: Negative for chest pain, palpitations and leg swelling.  Gastrointestinal: Negative for nausea, vomiting, abdominal pain, diarrhea, constipation and abdominal distention.  Genitourinary: Negative for dysuria, hematuria and difficulty urinating.  Musculoskeletal: Negative for myalgias and arthralgias.  Skin: Negative for rash.  Neurological: Negative for dizziness, weakness, numbness and headaches.    Allergies  Iohexol  Home Medications   Prior to Admission medications   Medication Sig Start Date End Date Taking? Authorizing Provider  Ascorbic Acid (VITAMIN C) 1000 MG tablet Take 1,000 mg by mouth daily.    Yes Historical Provider, MD  fish oil-omega-3 fatty acids 1000 MG capsule Take 1 g by mouth daily.    Yes Historical Provider, MD  ibuprofen (ADVIL,MOTRIN) 200 MG tablet Take 600 mg by mouth every 6 (six) hours as needed. For pain.   Yes Historical Provider, MD  Misc Natural Products (OSTEO BI-FLEX ADV TRIPLE ST) TABS Take 1 tablet by mouth daily.    Yes Historical Provider, MD  pseudoephedrine-guaifenesin [facility] D)  60-600 MG per tablet Take 1 tablet by mouth every 12 (twelve) hours.   Yes Historical Provider, MD  Pseudoephedrine-Ibuprofen (ADVIL COLD & SINUS LIQUI-GELS PO) Take 1 tablet by mouth 2 (two) times daily as needed (cold symptoms).   Yes Historical Provider, MD  EPIPEN 2-PAK 0.3 MG/0.3ML SOAJ injection INJECT 0.3 MLS INTO THE MUSCLE ONCE. 01/15/14   Alferd Apa Lowne, DO   BP 105/56 mmHg  Pulse 56  Temp(Src) 97.6 F (36.4 C) (Oral)  Resp 13  SpO2 97% Physical Exam  Constitutional: She is oriented to person, place, and time. She appears well-developed and well-nourished. No distress.  HENT:  Head:  Normocephalic and atraumatic.  Mouth/Throat: No oropharyngeal exudate.  Eyes: Conjunctivae and EOM are normal. Pupils are equal, round, and reactive to light. No scleral icterus.  Neck: Normal range of motion. Neck supple.  Cardiovascular: Normal rate and regular rhythm.  Exam reveals gallop (S3 over left lower sternal border.).   Pulmonary/Chest: Effort normal and breath sounds normal. No respiratory distress. She has no wheezes.  Abdominal: Soft. Bowel sounds are normal. She exhibits no distension. There is no tenderness.  Musculoskeletal: Normal range of motion. She exhibits no edema or tenderness.  Neurological: She is alert and oriented to person, place, and time. No cranial nerve deficit. She exhibits normal muscle tone.  Skin: Skin is warm. No rash noted. No erythema.    ED Course  Procedures (including critical care time) Labs Review Labs Reviewed  BASIC METABOLIC PANEL - Abnormal; Notable for the following:    Creatinine, Ser 0.46 (*)    Anion gap 16 (*)    All other components within normal limits  CBC WITH DIFFERENTIAL  D-DIMER, QUANTITATIVE  Randolm Idol, ED    Imaging Review Dg Chest 2 View  08/31/2014   CLINICAL DATA:  Shortness of breath for 8 days. Chest pressure on the left.  EXAM: CHEST  2 VIEW  COMPARISON:  None.  FINDINGS: Linear opacities are seen in the lung bases bilaterally. Punctate calcified granuloma is seen in the left upper lobe. Heart size is upper normal. No pneumothorax or pleural effusion.  IMPRESSION: Linear bibasilar airspace opacities are most consistent with atelectasis or scar.   Electronically Signed   By: Inge Rise M.D.   On: 08/31/2014 13:29     EKG Interpretation   Date/Time:  Monday August 31 2014 12:16:15 EST Ventricular Rate:  63 PR Interval:  140 QRS Duration: 86 QT Interval:  402 QTC Calculation: 411 R Axis:   75 Text Interpretation:  Normal sinus rhythm Nonspecific ST abnormality No  significant change since last  tracing Confirmed by Ashok Cordia  MD, Lennette Bihari  ([id number]) on 08/31/2014 12:37:05 PM      MDM   Final diagnoses:  SOB (shortness of breath)   Chest x-ray with scarring.  Some non-specific ST depression on EKG, but troponin negative.  Concern for PE given symptoms and family history, and she is at least heterozygous for the factor V Leiden mutation.  No other risk factors for PE.  Will check D-dimer and perform V/Q scan if elevated given contrast allergy.  2:30 pm: D-dimer elevated, will order V/Q scan.  Care transferred to Dr. Ashok Cordia.  Arman Filter, MD 09/10/14 [place], MD 09/11/14 Vernelle Emerald

## 2014-08-31 NOTE — ED Provider Notes (Signed)
I saw and evaluated the patient, reviewed the resident's note and I agree with the findings and plan.   EKG Interpretation   Date/Time:  Monday August 31 2014 12:16:15 EST Ventricular Rate:  63 PR Interval:  140 QRS Duration: 86 QT Interval:  402 QTC Calculation: 411 R Axis:   75 Text Interpretation:  Normal sinus rhythm Nonspecific ST abnormality No  significant change since last tracing Confirmed by Yvonna Brun  MD, Lennette Bihari  ([id number]) on 08/31/2014 12:37:05 PM      Pt with 1 week hx doe, atypical left chest discomfort/tightness.  ddimer elev.  On review pcp note - if PE pos, will admit. If PE study neg, will consult cardiology to see in ED for cp r/o admit.   Signed out above plan to DR Ray, vq still pending.      Mirna Mires, MD 08/31/14 [phone]

## 2014-08-31 NOTE — Telephone Encounter (Signed)
Fyi.

## 2014-08-31 NOTE — ED Notes (Signed)
Pt c/o SOB x 10 days sent here from PCP to r/o PE; pt denies pain

## 2014-08-31 NOTE — Progress Notes (Signed)
ANTICOAGULATION CONSULT NOTE - Initial Consult  Pharmacy Consult for Heparin Indication: chest pain/ACS  Allergies  Allergen Reactions  . Iohexol Hives and Swelling    Patient Measurements: Height: 5\' 5"  (165.1 cm) Weight: 166 lb (75.297 kg) IBW/kg (Calculated) : 57 Heparin Dosing Weight: 75 kg  Vital Signs: Temp: 97.6 F (36.4 C) (11/30 1221) Temp Source: Oral (11/30 1221) BP: 98/72 mmHg (11/30 1945) Pulse Rate: 72 (11/30 1945)  Labs:  Recent Labs  08/31/14 1224  HGB 14.8  HCT 45.4  PLT 241  CREATININE 0.46*    Estimated Creatinine Clearance: 70.2 mL/min (by C-G formula based on Cr of 0.46).   Medical History: Past Medical History  Diagnosis Date  . Heart murmur   . Allergy   . Migraines   . Osteopenia     Medications:  See electronic med rec  Assessment: 68 y.o. female presents with CP/SOB. To begin heparin for r/o ACS. Plan for stress echo in a.m. CBC stable at baseline.   Goal of Therapy:  Heparin level 0.3-0.7 units/ml Monitor platelets by anticoagulation protocol: Yes   Plan:  1. Heparin IV bolus 4000 units/hr 2. Heparin gtt at 900 units/hr 3. Will f/u 6 hr heparin level 4. Daily heparin level and CBC  Sherlon Handing, PharmD, BCPS Clinical pharmacist, pager [phone] 08/31/2014,8:12 PM

## 2014-08-31 NOTE — H&P (Signed)
Admit date: 08/31/2014 Referring Physician:  Dr. Jeanell Sparrow Primary Cardiologist:  None Chief complaint/reason for admission:Chest pain/SOB  HPI: This is a very pleasant [id number] WF with a history of migraine headaches and remote history of GERD who was in her USOH until this past week.  She works out a lot and went hiking last weekend which she does quite often without any problems.  Last Saturday she noticed that she could not hike as much due to DOE which is very unusual for her.  On Wednesday she started having chest tightness on the left side which is mild but has been constant since then.  There is no radiation of the discomfort and no associated nausea, diaphoresis or SOB with the tightness.  She presented to the ER for further evaluation.  She denies any fever, chills or cough.  She occasionally will get cramps in her right leg at night and will feel the "blood rushing in my leg".  She denies any claudication or swelling.  She denies any PND/orthopnea/dizziness/palpitations or syncope.  A Chest CT angio was done due to elevated D-Dimer and the chest CT was negative for PE.  POC troponin is normal as well.  EKG showed NSR with nonspecific ST abnormality.  Cardiology is now consulted for CP admission.    PMH:    Past Medical History  Diagnosis Date  . Heart murmur   . Allergy   . Migraines   . Osteopenia     PSH:    Past Surgical History  Procedure Laterality Date  . Colonoscopy      ALLERGIES:   Iohexol  Prior to Admit Meds:   (Not in a hospital admission) Family HX:    Family History  Problem Relation Age of Onset  . Heart failure      CHF  . Coronary artery disease    . Heart disease Mother 45    chf,  valve repair  . Cancer Mother 19    colon  . Colon cancer Mother 74  . Heart disease Father 27    chf  . CAD Father    Social HX:    History   Social History  . Marital Status: Married    Spouse Name: N/A    Number of Children: N/A  . Years of Education: N/A    Occupational History  . Not on file.   Social History Main Topics  . Smoking status: Never Smoker   . Smokeless tobacco: Never Used  . Alcohol Use: 2.4 oz/week    4 Glasses of wine per week  . Drug Use: No  . Sexual Activity:    Partners: Male   Other Topics Concern  . Not on file   Social History Narrative     ROS:  All 11 ROS were addressed and are negative except what is stated in the HPI  PHYSICAL EXAM Filed Vitals:   08/31/14 1845  BP: 109/55  Pulse: 72  Temp:   Resp: 17   General: Well developed, well nourished, in no acute distress Head: Eyes PERRLA, No xanthomas.   Normal cephalic and atramatic  Lungs:   Clear bilaterally to auscultation and percussion. Heart:   HRRR S1 S2 Pulses are 2+ & equal.            No carotid bruit. No JVD.  No abdominal bruits. No femoral bruits. Abdomen: Bowel sounds are positive, abdomen soft and non-tender without masses  Extremities:   No clubbing, cyanosis or edema.  DP +  1 Neuro: Alert and oriented X 3. Psych:  Good affect, responds appropriately   Labs:   Lab Results  Component Value Date   WBC 6.8 08/31/2014   HGB 14.8 08/31/2014   HCT 45.4 08/31/2014   MCV 92.1 08/31/2014   PLT 241 08/31/2014    Recent Labs Lab 08/31/14 1224  NA 140  K 3.9  CL 100  CO2 24  BUN 15  CREATININE 0.46*  CALCIUM 10.1  GLUCOSE 95   No results found for: CKTOTAL, CKMB, CKMBINDEX, TROPONINI No results found for: PTT No results found for: INR, PROTIME   Lab Results  Component Value Date   CHOL 190 01/02/2013   CHOL 194 04/27/2011   CHOL 180 01/14/2010   Lab Results  Component Value Date   HDL 62.40 01/02/2013   HDL 77.20 04/27/2011   HDL 54.00 01/14/2010   Lab Results  Component Value Date   LDLCALC 117* 01/02/2013   LDLCALC 107* 04/27/2011   LDLCALC 115* 01/14/2010   Lab Results  Component Value Date   TRIG 53.0 01/02/2013   TRIG 49.0 04/27/2011   TRIG 56.0 01/14/2010   Lab Results  Component Value Date    CHOLHDL 3 01/02/2013   CHOLHDL 3 04/27/2011   CHOLHDL 3 01/14/2010   No results found for: LDLDIRECT    Radiology:  No results found.  EKG:  NSR with nonspecific ST abnormality  ASSESSMENT:  1.  Chest pain with typical and atypical components.  It is a tightness like "someone sitting on my chest" but has been constant for 6 days without any relief and troponin is normal.  She also has had DOE which is new for her and she is very active with outdoor sports.  Chest CT was negative for PE.  EKG shows nonspecific ST abnormality.  She has a family history of CAD in her Dad and brother.  She also had a history of GERD and took Nexium for a while but stopped that. 2.  Remote history of GERD 3.  RLE cramps at night only.,  PLAN:   1.  Admit to tele bed 2.  Cycle cardiac enzymes 3.  IV Heparin gtt until rule out complete 4.  ASA daily 5.  NPO after MN 6.  Stress echo in am if enzymes negative 7.  Check FLP in am 8.  No BB secondary to soft BP   [name], MD  08/31/2014  7:48 PM

## 2014-08-31 NOTE — ED Provider Notes (Signed)
Hand off received from Dr. Ashok Cordia.  Plan follow up on vq scan.  If this does not show pe, plan consult to cardiology as per pmd note.    From office Shortness of breath Patient with progressive shortness of breath and chest pain with exertion concerning for new onset angina in patient with Heart score of 5  (+2 history, +1 age, +1 nonspecific ekg, +1 HLD). Considered d-dimer, CXR and get patient into cardiology within 1-2 days. Spoke with Dr. Burt Knack who recommended patient go to the emergency room and have evaluation for PE (d-dimer and if positive VQ scan as contrast allergy) and cardiology will consult (he is informing the inpatient team). He states if pulmonary workup unrevealing, would plan for stress testing tomorrow.   Discussed with Ellen Henri and patient on cardiology list for evaluation.   Shaune Pollack, MD 09/02/14 [phone]

## 2014-08-31 NOTE — ED Notes (Signed)
Pt eating food.

## 2014-08-31 NOTE — Assessment & Plan Note (Addendum)
Patient with progressive shortness of breath and chest pain with exertion concerning for new onset angina in patient with Heart score of 5  (+2 history, +1 age, +1 nonspecific ekg, +1 HLD). Considered d-dimer, CXR and get patient into cardiology within 1-2 days. Spoke with Dr. Burt Knack who recommended patient go to the emergency room and have evaluation for PE (d-dimer and if positive VQ scan as contrast allergy) and cardiology will consult (he is informing the inpatient team). He states if pulmonary workup unrevealing, would plan for stress testing tomorrow.

## 2014-08-31 NOTE — ED Notes (Signed)
Meal tray ordered for patient.

## 2014-08-31 NOTE — ED Notes (Signed)
Attempted report 

## 2014-08-31 NOTE — Telephone Encounter (Signed)
Patient Information:  Caller Name: Marveline  Phone: [phone]  Patient: Terri Mendoza, Terri Mendoza  Gender: Female  DOB: Feb 29, 1946  Age: 68 Years  PCP: Rosalita Chessman.  Office Follow Up:  Does the office need to follow up with this patient?: No  Instructions For The Office: N/A  RN Note:  Denies chest pain.  Shortness of breath does not worsen when lies down.  Suggested avoidance of combination OTC products.  No appointments within 4 hours at [facility] office; scheduled for 1030 at [facility] per pt request.   Symptoms  Reason For Call & Symptoms: Appointment requested for shortness of breath with cough, postnasal drip, and chest tightness.  Exertion increases shortness of breath.  Reviewed Health History In EMR: Yes  Reviewed Medications In EMR: Yes  Reviewed Allergies In EMR: Yes  Reviewed Surgeries / Procedures: Yes  Date of Onset of Symptoms: 08/24/2014  Treatments Tried: Mucinex, Advil Cold and Sinus  Treatments Tried Worked: No  Guideline(s) Used:  Breathing Difficulty  Disposition Per Guideline:   Go to Office Now  Reason For Disposition Reached:   Mild difficulty breathing (e.g., minimal/no SOB at rest, SOB with walking, pulse < 100) of new onset or worse than normal  Advice Given:  General Care Advice for Breathing Difficulty:  Find position of greatest comfort. For most patients the best position is semi-upright (e.g., sitting up in a comfortable chair or lying back against pillows).  Elevate head of bed (e.g., use pillows or place blocks under bed).  Limit activities or space activities apart during the day. Prioritize activities.  Call Back If:  Severe difficulty breathing occurs  Fever more than 100.5 F (38.1 C)  You become worse.  Patient Will Follow Care Advice:  YES  Appointment Scheduled:  08/31/2014 10:30:00 Appointment Scheduled Provider:  Garret Reddish

## 2014-09-01 DIAGNOSIS — R011 Cardiac murmur, unspecified: Secondary | ICD-10-CM | POA: Diagnosis not present

## 2014-09-01 DIAGNOSIS — R0602 Shortness of breath: Secondary | ICD-10-CM | POA: Diagnosis not present

## 2014-09-01 DIAGNOSIS — R079 Chest pain, unspecified: Secondary | ICD-10-CM

## 2014-09-01 DIAGNOSIS — R0789 Other chest pain: Secondary | ICD-10-CM | POA: Diagnosis not present

## 2014-09-01 DIAGNOSIS — G43909 Migraine, unspecified, not intractable, without status migrainosus: Secondary | ICD-10-CM | POA: Diagnosis not present

## 2014-09-01 DIAGNOSIS — R072 Precordial pain: Secondary | ICD-10-CM

## 2014-09-01 LAB — BASIC METABOLIC PANEL
Anion gap: 13 (ref 5–15)
BUN: 18 mg/dL (ref 6–23)
CALCIUM: 9.1 mg/dL (ref 8.4–10.5)
CO2: 26 mEq/L (ref 19–32)
CREATININE: 0.59 mg/dL (ref 0.50–1.10)
Chloride: 100 mEq/L (ref 96–112)
GFR calc Af Amer: >90 mL/min (ref >90)
GLUCOSE: 106 mg/dL — AB (ref 70–99)
Potassium: 4.2 mEq/L (ref 3.7–5.3)
Sodium: 139 mEq/L (ref 137–147)

## 2014-09-01 LAB — CBC
HEMATOCRIT: 43.7 % (ref 36.0–46.0)
HEMOGLOBIN: 14.2 g/dL (ref 12.0–15.0)
MCH: 29.8 pg (ref 26.0–34.0)
MCHC: 32.5 g/dL (ref 30.0–36.0)
MCV: 91.8 fL (ref 78.0–100.0)
Platelets: 237 10*3/uL (ref 150–400)
RBC: 4.76 MIL/uL (ref 3.87–5.11)
RDW: 12.9 % (ref 11.5–15.5)
WBC: 8.4 10*3/uL (ref 4.0–10.5)

## 2014-09-01 LAB — LIPID PANEL
Cholesterol: 169 mg/dL (ref 0–200)
HDL: 40 mg/dL (ref >39)
LDL CALC: 110 mg/dL — AB (ref 0–99)
Total CHOL/HDL Ratio: 4.2 RATIO
Triglycerides: 97 mg/dL (ref <150)
VLDL: 19 mg/dL (ref 0–40)

## 2014-09-01 LAB — HEPARIN LEVEL (UNFRACTIONATED)
HEPARIN UNFRACTIONATED: 0.45 [IU]/mL (ref 0.30–0.70)
Heparin Unfractionated: 0.18 IU/mL — ABNORMAL LOW (ref 0.30–0.70)
Heparin Unfractionated: 0.62 IU/mL (ref 0.30–0.70)

## 2014-09-01 LAB — TROPONIN I
Troponin I: <0.3 ng/mL (ref <0.30)
Troponin I: <0.3 ng/mL (ref <0.30)

## 2014-09-01 LAB — HEMOGLOBIN A1C
Hgb A1c MFr Bld: 5.9 % — ABNORMAL HIGH (ref <5.7)
Mean Plasma Glucose: 123 mg/dL — ABNORMAL HIGH (ref <117)

## 2014-09-01 LAB — PROTIME-INR
INR: 1.11 (ref 0.00–1.49)
Prothrombin Time: 14.4 seconds (ref 11.6–15.2)

## 2014-09-01 MED ORDER — PREDNISONE 20 MG PO TABS
50.0000 mg | ORAL_TABLET | Freq: Four times a day (QID) | ORAL | Status: AC
  Administered 2014-09-01 – 2014-09-02 (×3): 50 mg via ORAL
  Filled 2014-09-01: qty 1
  Filled 2014-09-01 (×3): qty 2
  Filled 2014-09-01 (×2): qty 1

## 2014-09-01 MED ORDER — PREDNISONE 10 MG PO TABS: 50.0000 mg | ORAL_TABLET | Freq: Four times a day (QID) | ORAL | Status: DC

## 2014-09-01 MED ORDER — DIPHENHYDRAMINE HCL 25 MG PO CAPS
50.0000 mg | ORAL_CAPSULE | Freq: Once | ORAL | Status: AC
  Administered 2014-09-02: 50 mg via ORAL
  Filled 2014-09-01: qty 2

## 2014-09-01 MED ORDER — HEPARIN BOLUS VIA INFUSION
1500.0000 [IU] | Freq: Once | INTRAVENOUS | Status: AC
  Administered 2014-09-01: 1500 [IU] via INTRAVENOUS
  Filled 2014-09-01: qty 1500

## 2014-09-01 NOTE — Progress Notes (Signed)
UR completed 

## 2014-09-01 NOTE — Progress Notes (Signed)
ANTICOAGULATION CONSULT NOTE - Follow Up Consult  Pharmacy Consult for Heparin Indication: chest pain/ACS, r/o PE  Allergies  Allergen Reactions  . Iohexol Hives and Swelling    Patient Measurements: Height: 5\' 5"  (165.1 cm) Weight: 163 lb (73.936 kg) IBW/kg (Calculated) : 57 Heparin Dosing Weight: 73kg  Vital Signs: Temp: 98.3 F (36.8 C) (12/01 0548) Temp Source: Oral (12/01 0548) BP: 111/60 mmHg (12/01 0548) Pulse Rate: 64 (12/01 0548)  Labs:  Recent Labs  08/31/14 1224 08/31/14 2154 09/01/14 0327 09/01/14 1445  HGB 14.8  --  14.2  --   HCT 45.4  --  43.7  --   PLT 241  --  237  --   LABPROT  --   --  14.4  --   INR  --   --  1.11  --   HEPARINUNFRC  --   --  0.45 0.18*  CREATININE 0.46*  --  0.59  --   TROPONINI  --  <0.30 <0.30  --     Estimated Creatinine Clearance: 69.7 mL/min (by C-G formula based on Cr of 0.59).   Medications:  Heparin 900 units/hr  Assessment: 68yof continues on heparin for CP/ACS. Cardiology has also requested a CTA to r/o PE. Heparin level (0.18) has decreased to subtherapeutic levels - will increase heparin rate and check follow-up level. - H/H and Plts stable - No significant bleeding reported - No problems with line/infusion per RN  Goal of Therapy:  Heparin level 0.3-0.7 units/ml Monitor platelets by anticoagulation protocol: Yes   Plan:  1. Heparin IV bolus 1500 units x 1 2. Increase heparin drip to 1100 units/hr (11 ml/hr) 3. Check heparin level 6 hours after rate increase 4. Daily heparin level and CBC  Earleen Newport  [phone] 09/01/2014,3:59 PM

## 2014-09-01 NOTE — Progress Notes (Signed)
Subjective: Still has a constant pressure up near the left shoulder.   Objective: Vital signs in last 24 hours: Temp:  [97.6 F (36.4 C)-98.3 F (36.8 C)] 98.3 F (36.8 C) (12/01 0548) Pulse Rate:  [53-83] 64 (12/01 0548) Resp:  [13-25] 18 (12/01 0548) BP: (98-125)/(49-75) 111/60 mmHg (12/01 0548) SpO2:  [94 %-99 %] 95 % (12/01 0548) Weight:  [163 lb (73.936 kg)-166 lb (75.297 kg)] 163 lb (73.936 kg) (12/01 0548)    Intake/Output from previous day: 11/30 0701 - 12/01 0700 In: -  Out: 600 [Urine:600] Intake/Output this shift:    Medications Current Facility-Administered Medications  Medication Dose Route Frequency Provider Last Rate Last Dose  . acetaminophen (TYLENOL) tablet 650 mg  650 mg Oral Q4H PRN Consuelo Pandy, PA-C   650 mg at 08/31/14 2205  . aspirin EC tablet 81 mg  81 mg Oral Daily Brittainy M Simmons, PA-C      . heparin ADULT infusion 100 units/mL (25000 units/250 mL)  900 Units/hr Intravenous Continuous Sindy Guadel[place], [facility] 9 mL/hr at 08/31/14 2208 900 Units/hr at 08/31/14 2208  . nitroGLYCERIN (NITROSTAT) SL tablet 0.4 mg  0.4 mg Sublingual Q5 Min x 3 PRN Brittainy M Simmons, PA-C      . ondansetron (ZOFRAN) injection 4 mg  4 mg Intravenous Q6H PRN Brittainy Erie Noe, PA-C        PE: General appearance: alert, cooperative and no distress Lungs: Diffuse crackles.  No wheezing Heart: regular rate and rhythm and 1/6 sys MM Extremities: No LEE Pulses: 2+ and symmetric Skin: Warm and dry Neurologic: Grossly normal  Lab Results:   Recent Labs  08/31/14 1224 09/01/14 0327  WBC 6.8 8.4  HGB 14.8 14.2  HCT 45.4 43.7  PLT 241 237   BMET  Recent Labs  08/31/14 1224 09/01/14 0327  NA 140 139  K 3.9 4.2  CL 100 100  CO2 24 26  GLUCOSE 95 106*  BUN 15 18  CREATININE 0.46* 0.59  CALCIUM 10.1 9.1   PT/INR  Recent Labs  09/01/14 0327  LABPROT 14.4  INR 1.11   Cholesterol  Recent Labs  09/01/14 0327  CHOL 169   Lipid Panel      Component Value Date/Time   CHOL 169 09/01/2014 0327   TRIG 97 09/01/2014 0327   HDL 40 09/01/2014 0327   CHOLHDL 4.2 09/01/2014 0327   VLDL 19 09/01/2014 0327   LDLCALC 110* 09/01/2014 0327    Cardiac Panel (last 3 results)  Recent Labs  08/31/14 2154 09/01/14 0327  TROPONINI <0.30 <0.30     Assessment/Plan  68yo WF with a history of migraine headaches and remote history of GERD who was in her USOH until this past week. She works out a lot and went hiking last weekend which she does quite often without any problems. Last Saturday she noticed that she could not hike as much due to DOE which is very unusual for her. On Wednesday she started having chest tightness on the left side which is mild but has been constant since then.    Chest pain Continues to have left sided ache up near her shoulder.  Nothing makes it worse or better. Troponin negative times three.  Stress echo today.  Negative VQ scan.  Atelectasis/scar on CXR which likely accounts for the crackles on exam. BP stable but on the low side.  No BB.   ASA/Heparin.  NSR on EKG.     DOE (dyspnea on exertion)  LOS: 1 day    HAGER, BRYAN PA-C 09/01/2014 7:56 AM  As above, patient seen and examined. She continues to complain of left upper chest tightness that is not pleuritic. It has been continuous for 6 days without completely resolving. She also complains of dyspnea both at rest but worse with exertion. Her electrocardiogram shows no ST changes. Enzymes have been negative. VQ scan was low probability. We will await results of stress echocardiogram. She is very concerned about her symptoms and etiology. She does have some tenderness to palpation in her chest and she may therefore have musculoskeletal pain. However she also continues to complain of dyspnea. Her d-dimer was elevated. We will premedicate with steroids, Benadryl and Pepcid and proceed with CTA of her chest to fully exclude pulmonary embolus and further  evaluate her lungs. Her symptoms do not seem consistent with cardiac etiology. Kirk Ruths

## 2014-09-01 NOTE — Progress Notes (Signed)
  Echocardiogram Echocardiogram Stress Test has been performed.  Jaci Desanto FRANCES 09/01/2014, 10:42 AM

## 2014-09-01 NOTE — Plan of Care (Signed)
Problem: Consults Goal: Chest Pain Patient Education (See Patient Education module for education specifics.)  Outcome: Completed/Met Date Met:  09/01/14 Goal: Skin Care Protocol Initiated - if Braden Score 18 or less If consults are not indicated, leave blank or document N/A  Outcome: Completed/Met Date Met:  09/01/14 Goal: Tobacco Cessation referral if indicated Outcome: Not Applicable Date Met:  18/86/77 Goal: Nutrition Consult-if indicated Outcome: Not Applicable Date Met:  37/36/68 Goal: Diabetes Guidelines if Diabetic/Glucose > 140 If diabetic or lab glucose is > 140 mg/dl - Initiate Diabetes/Hyperglycemia Guidelines & Document Interventions  Outcome: Not Applicable Date Met:  15/94/70  Problem: Phase I Progression Outcomes Goal: Hemodynamically stable Outcome: Completed/Met Date Met:  09/01/14 Goal: Anginal pain relieved Outcome: Completed/Met Date Met:  09/01/14 Goal: Aspirin unless contraindicated Outcome: Completed/Met Date Met:  09/01/14 Goal: MD aware of Cardiac Marker results Outcome: Completed/Met Date Met:  09/01/14 Goal: Voiding-avoid urinary catheter unless indicated Outcome: Completed/Met Date Met:  09/01/14 Goal: Other Phase I Outcomes/Goals Outcome: Completed/Met Date Met:  09/01/14  Problem: Phase II Progression Outcomes Goal: Hemodynamically stable Outcome: Completed/Met Date Met:  09/01/14 Goal: Anginal pain relieved Outcome: Completed/Met Date Met:  09/01/14 Goal: Stress Test if indicated Outcome: Completed/Met Date Met:  09/01/14 Goal: Cath/PCI Day Path if indicated Outcome: Not Applicable Date Met:  76/15/18 Goal: CV Risk Factors identified Outcome: Completed/Met Date Met:  09/01/14 Goal: Cardiac Rehab if ordered Outcome: Not Applicable Date Met:  34/37/35 Goal: If positive for MI, change to MI Path Outcome: Not Applicable Date Met:  78/97/84 Goal: Other Phase II Outcomes/Goals Outcome: Completed/Met Date Met:  09/01/14  Problem: Phase III  Progression Outcomes Goal: Hemodynamically stable Outcome: Completed/Met Date Met:  09/01/14 Goal: No anginal pain Outcome: Completed/Met Date Met:  09/01/14 Goal: Cath/PCI Path as indicated Outcome: Not Applicable Date Met:  78/41/28 Goal: Vascular site scale level 0 - I Vascular Site Scale Level 0: No bruising/bleeding/hematoma Level I (Mild): Bruising/Ecchymosis, minimal bleeding/ooozing, palpable hematoma < 3 cm Level II (Moderate): Bleeding not affecting hemodynamic parameters, pseudoaneurysm, palpable hematoma > 3 cm Level III (Severe) Bleeding which affects hemodynamic parameters or retroperitoneal hemorrhage  Outcome: Not Applicable Date Met:  20/81/38 Goal: Discharge plan remains appropriate-arrangements made Outcome: Completed/Met Date Met:  09/01/14 Goal: Tolerating diet Outcome: Completed/Met Date Met:  09/01/14 Goal: If positive for MI, change to MI Path Outcome: Not Applicable Date Met:  87/19/59 Goal: Other Phase III Outcomes/Goals Outcome: Completed/Met Date Met:  09/01/14

## 2014-09-01 NOTE — Plan of Care (Signed)
Problem: Phase I Progression Outcomes Goal: Hemodynamically stable Outcome: Progressing Goal: Aspirin unless contraindicated Outcome: Progressing Goal: Voiding-avoid urinary catheter unless indicated Outcome: Progressing

## 2014-09-01 NOTE — Progress Notes (Signed)
[facility] for Heparin Indication: chest pain/ACS, r/o PE  Allergies  Allergen Reactions  . Iohexol Hives and Swelling    Patient Measurements: Height: 5\' 5"  (165.1 cm) Weight: 163 lb (73.936 kg) IBW/kg (Calculated) : 57 Heparin Dosing Weight: 73kg  Vital Signs: Temp: 98.1 F (36.7 C) (12/01 2125) Temp Source: Oral (12/01 2125) BP: 100/58 mmHg (12/01 2125) Pulse Rate: 67 (12/01 2125)  Labs:  Recent Labs  08/31/14 1224 08/31/14 2154 09/01/14 0327 09/01/14 1445 09/01/14 2248  HGB 14.8  --  14.2  --   --   HCT 45.4  --  43.7  --   --   PLT 241  --  237  --   --   LABPROT  --   --  14.4  --   --   INR  --   --  1.11  --   --   HEPARINUNFRC  --   --  0.45 0.18* 0.62  CREATININE 0.46*  --  0.59  --   --   TROPONINI  --  <0.30 <0.30 <0.30  --     Estimated Creatinine Clearance: 69.7 mL/min (by C-G formula based on Cr of 0.59).   Assessment: 67 yo female with chest pain, possible ACS/PE, for heparin  Goal of Therapy:  Heparin level 0.3-0.7 units/ml Monitor platelets by anticoagulation protocol: Yes   Plan: Continue Heparin at current rate Follow-up am labs.   Jakyah Bradby, Bronson Curb  09/01/2014,11:57 PM

## 2014-09-01 NOTE — Progress Notes (Signed)
ANTICOAGULATION CONSULT NOTE - Follow Up Consult  Pharmacy Consult for Heparin  Indication: chest pain/ACS  Labs:  Recent Labs  08/31/14 1224 08/31/14 2154 09/01/14 0327  HGB 14.8  --  14.2  HCT 45.4  --  43.7  PLT 241  --  237  LABPROT  --   --  14.4  INR  --   --  1.11  HEPARINUNFRC  --   --  0.45  CREATININE 0.46*  --   --   TROPONINI  --  <0.30  --     Assessment: Therapeutic heparin level x 1  Goal of Therapy:  Heparin level 0.3-0.7 units/ml Monitor platelets by anticoagulation protocol: Yes   Plan:  -Continue heparin at 900 units/hr -1100 HL -Daily CBC/HL -Monitor for bleeding  Narda Bonds 09/01/2014,4:14 AM

## 2014-09-02 ENCOUNTER — Observation Stay (HOSPITAL_COMMUNITY): Payer: Medicare HMO

## 2014-09-02 ENCOUNTER — Encounter (HOSPITAL_COMMUNITY): Payer: Self-pay | Admitting: Physician Assistant

## 2014-09-02 DIAGNOSIS — R911 Solitary pulmonary nodule: Secondary | ICD-10-CM

## 2014-09-02 DIAGNOSIS — R0602 Shortness of breath: Secondary | ICD-10-CM

## 2014-09-02 DIAGNOSIS — R0609 Other forms of dyspnea: Secondary | ICD-10-CM

## 2014-09-02 DIAGNOSIS — R918 Other nonspecific abnormal finding of lung field: Secondary | ICD-10-CM

## 2014-09-02 HISTORY — DX: Solitary pulmonary nodule: R91.1

## 2014-09-02 LAB — CBC
HEMATOCRIT: 44.4 % (ref 36.0–46.0)
HEMOGLOBIN: 14.5 g/dL (ref 12.0–15.0)
MCH: 29.8 pg (ref 26.0–34.0)
MCHC: 32.7 g/dL (ref 30.0–36.0)
MCV: 91.4 fL (ref 78.0–100.0)
Platelets: 248 10*3/uL (ref 150–400)
RBC: 4.86 MIL/uL (ref 3.87–5.11)
RDW: 12.9 % (ref 11.5–15.5)
WBC: 4.5 10*3/uL (ref 4.0–10.5)

## 2014-09-02 LAB — HEPARIN LEVEL (UNFRACTIONATED): Heparin Unfractionated: 0.63 IU/mL (ref 0.30–0.70)

## 2014-09-02 LAB — SEDIMENTATION RATE: Sed Rate: 70 mm/hr — ABNORMAL HIGH (ref 0–22)

## 2014-09-02 LAB — C-REACTIVE PROTEIN: CRP: 1.4 mg/dL — ABNORMAL HIGH (ref <0.60)

## 2014-09-02 LAB — RHEUMATOID FACTOR: Rhuematoid fact SerPl-aCnc: <10 IU/mL (ref <=14)

## 2014-09-02 MED ORDER — ASPIRIN 81 MG PO TBEC: 81.0000 mg | DELAYED_RELEASE_TABLET | Freq: Every day | ORAL | Status: DC

## 2014-09-02 MED ORDER — IOHEXOL 350 MG/ML SOLN
80.0000 mL | Freq: Once | INTRAVENOUS | Status: AC | PRN
  Administered 2014-09-02: 80 mL via INTRAVENOUS

## 2014-09-02 NOTE — Progress Notes (Signed)
Pt. reambulated for 450 ft. O2 saturation stayed above 92 entire ambulation with no O2.

## 2014-09-02 NOTE — Consult Note (Addendum)
Name: Terri Mendoza MRN: [id number] DOB: 15-Jan-1946    ADMISSION DATE:  08/31/2014 CONSULTATION DATE:  09/02/2014  REFERRING MD :  Dr Radford Pax, Cards  CHIEF COMPLAINT:  Chest Pain  BRIEF PATIENT DESCRIPTION: 68 year old female with no significant medical history presented to [facility] ED 11/30 c/o chest tightness and DOE. Cardiac workup thus far has been negative. D-dimer was elevated, but CTA was negative for PE. CP is repsolved but SOB persists. PCCM asked to see for evaluation.  SIGNIFICANT EVENTS   STUDIES:  11/30 V/Q > Low PE probability 12/2 CTA chest > Negative for PE, bibasilar ATX, Probable R IJ vein thrombus. 4 mm subpleural nodule  in the left upper lobe  HISTORY OF PRESENT ILLNESS:  68 year old female with PMH as below. She is an active woman who exercises regularly. If fact last weekend she was hiking and is very active outdoors. She noted that while hiking she was becoming dyspneic which is not usually the case. 11/25 she began to experience some L sided chest weakness which was described as mild, constant. Pain had no associated symptoms and she denied andy URI symptoms as well. She was admitted to cardiology for workup, which has been largely negative. She had an elevated D-dimer, however, V/Q and CTA conclude the absence of PE. 12/2 she still remains SOB. PCCM has been asked to see for further evaluation.   PAST MEDICAL HISTORY :   has a past medical history of Heart murmur; Allergy; Migraines; and Osteopenia.  has past surgical history that includes Colonoscopy. Prior to Admission medications   Medication Sig Start Date End Date Taking? Authorizing Provider  Ascorbic Acid (VITAMIN C) 1000 MG tablet Take 1,000 mg by mouth daily.    Yes Historical Provider, MD  fish oil-omega-3 fatty acids 1000 MG capsule Take 1 g by mouth daily.    Yes Historical Provider, MD  ibuprofen (ADVIL,MOTRIN) 200 MG tablet Take 600 mg by mouth every 6 (six) hours as needed. For pain.   Yes  Historical Provider, MD  Misc Natural Products (OSTEO BI-FLEX ADV TRIPLE ST) TABS Take 1 tablet by mouth daily.    Yes Historical Provider, MD  pseudoephedrine-guaifenesin (MUCINEX D) 60-600 MG per tablet Take 1 tablet by mouth every 12 (twelve) hours.   Yes Historical Provider, MD  Pseudoephedrine-Ibuprofen (ADVIL COLD & SINUS LIQUI-GELS PO) Take 1 tablet by mouth 2 (two) times daily as needed (cold symptoms).   Yes Historical Provider, MD  EPIPEN 2-PAK 0.3 MG/0.3ML SOAJ injection INJECT 0.3 MLS INTO THE MUSCLE ONCE. 01/15/14   Rosalita Chessman, DO   Allergies  Allergen Reactions  . Iohexol Hives and Swelling    FAMILY HISTORY:  family history includes CAD in her father; Cancer (age of onset: 3) in her mother; Colon cancer (age of onset: 52) in her mother; Coronary artery disease in an other family member; Heart disease (age of onset: 28) in her father; Heart disease (age of onset: 61) in her mother; Heart failure in an other family member. SOCIAL HISTORY:  reports that she has never smoked. She has never used smokeless tobacco. She reports that she drinks about 2.4 oz of alcohol per week. She reports that she does not use illicit drugs.  REVIEW OF SYSTEMS:   Constitutional: Negative for fever, chills, weight loss, malaise/fatigue and diaphoresis.  HENT: Negative for hearing loss, ear pain, nosebleeds, congestion, sore throat, neck pain, tinnitus and ear discharge.   Eyes: Negative for blurred vision, double vision, photophobia,  pain, discharge and redness.  Respiratory: (+)cough, (-)hemoptysis, (-)sputum production, (+)shortness of breath, (-)wheezing and (-)stridor.   Cardiovascular: (+)chest pain, negative for palpitations, orthopnea, claudication, leg swelling and PND.  Gastrointestinal: Negative for heartburn, nausea, vomiting, abdominal pain, diarrhea, constipation, blood in stool and melena.  Genitourinary: Negative for dysuria, urgency, frequency, hematuria and flank pain.    Musculoskeletal: Negative for myalgias, back pain, joint pain and falls.  Skin: Negative for itching and rash.  Neurological: Negative for dizziness, tingling, tremors, sensory change, speech change, focal weakness, seizures, loss of consciousness, weakness and headaches.  Endo/Heme/Allergies: Negative for environmental allergies and polydipsia. Does not bruise/bleed easily.  SUBJECTIVE:   VITAL SIGNS: Temp:  [97.4 F (36.3 C)-98.1 F (36.7 C)] 97.4 F (36.3 C) (12/02 0524) Pulse Rate:  [67-71] 71 (12/02 0524) Resp:  [14-18] 18 (12/02 0524) BP: (94-115)/(55-58) 115/55 mmHg (12/02 0524) SpO2:  [95 %-96 %] 95 % (12/02 0524) Weight:  [73.755 kg (162 lb 9.6 oz)] 73.755 kg (162 lb 9.6 oz) (12/02 0524)  PHYSICAL EXAMINATION: General:  Thin female in NAD Neuro:  Alert, oriented, non-focal HEENT:  [place]/AT, PERRL, no JVD noted Cardiovascular:  RRR, No MRG Lungs: Mild bibasilar rales Abdomen:  Soft, non-tender, non-distended Musculoskeletal:  No acute deformity or ROM limitation. No edema Skin:  Intact   Recent Labs Lab 08/31/14 1224 09/01/14 0327  NA 140 139  K 3.9 4.2  CL 100 100  CO2 24 26  BUN 15 18  CREATININE 0.46* 0.59  GLUCOSE 95 106*    Recent Labs Lab 08/31/14 1224 09/01/14 0327 09/02/14 0427  HGB 14.8 14.2 14.5  HCT 45.4 43.7 44.4  WBC 6.8 8.4 4.5  PLT 241 237 248   Dg Chest 2 View  08/31/2014   CLINICAL DATA:  Shortness of breath for 8 days. Chest pressure on the left.  EXAM: CHEST  2 VIEW  COMPARISON:  None.  FINDINGS: Linear opacities are seen in the lung bases bilaterally. Punctate calcified granuloma is seen in the left upper lobe. Heart size is upper normal. No pneumothorax or pleural effusion.  IMPRESSION: Linear bibasilar airspace opacities are most consistent with atelectasis or scar.   Electronically Signed   By: Inge Rise M.D.   On: 08/31/2014 13:29   Ct Angio Chest Pe W/cm &/or Wo Cm  09/02/2014   CLINICAL DATA:  Dyspnea.  EXAM: CT  ANGIOGRAPHY CHEST WITH CONTRAST  TECHNIQUE: Multidetector CT imaging of the chest was performed using the standard protocol during bolus administration of intravenous contrast. Multiplanar CT image reconstructions and MIPs were obtained to evaluate the vascular anatomy.  CONTRAST:  88m OMNIPAQUE IOHEXOL 350 MG/ML SOLN  COMPARISON:  Chest radiograph of August 31, 2014.  FINDINGS: No pneumothorax or significant pleural effusion is noted. Mild bilateral posterior basilar opacities are noted most consistent with subsegmental atelectasis. 4 mm subpleural nodule is noted in left upper lobe laterally best seen on image number 20 of series 6. No definite evidence of pulmonary embolus is noted. There is no evidence of thoracic aortic dissection or aneurysm. No mediastinal mass or adenopathy is noted. Possible thrombosis of right internal jugular vein is noted. Visualized portion of upper abdomen appears normal.  Review of the MIP images confirms the above findings.  IMPRESSION: No evidence of pulmonary embolus.  Mild bilateral posterior basilar subsegmental atelectasis.  Probable thrombosis of right internal jugular vein is noted; Doppler evaluation is recommended for confirmation.  4 mm subpleural nodule is seen in the left upper lobe. If the patient  is at high risk for bronchogenic carcinoma, follow-up chest CT at 1 year is recommended. If the patient is at low risk, no follow-up is needed. This recommendation follows the consensus statement: Guidelines for Management of Small Pulmonary Nodules Detected on CT Scans: A Statement from the [place] as published in Radiology 2005; 237:395-400.   Electronically Signed   By: Sabino Dick M.D.   On: 09/02/2014 09:56   Nm Pulmonary Perf And Vent  08/31/2014   CLINICAL DATA:  Shortness of breath.  EXAM: NUCLEAR MEDICINE VENTILATION - PERFUSION LUNG SCAN  TECHNIQUE: Ventilation images were obtained in multiple projections using inhaled aerosol technetium 99 M DTPA.  Perfusion images were obtained in multiple projections after intravenous injection of Tc-27mMAA.  RADIOPHARMACEUTICALS:  40.0 mCi Tc-921mTPA aerosol and 6.0 mCi Tc-9929mA  COMPARISON:  None.  FINDINGS: Ventilation: No focal significant ventilation defect.  Perfusion: No significant wedge shaped peripheral perfusion defects to suggest acute pulmonary embolism.  IMPRESSION: Low probability pulmonary embolus.   Electronically Signed   By: ThoMarcello Mooresegister   On: 08/31/2014 17:20    ASSESSMENT / PLAN:  Dyspnea - etiology unknown at this point. PE workup thus far has been negative, although there is a family history of this associated with factor 5 leiden. She has never smoked or had any occupational exposures that she is aware of. She describes a lot of outdoor activity, but is not aware of any tick bites or associated rashes.  Pulmonary nodule - 4mm26m LUL  - Will send autoimmune workup (CRP, ESR, ANA, Rheumatoid factor) - Ambulatory desaturation assessment, if SpO2 falls below 88% will need to be DC on O2.  - Will schedule her with outpatient follow up with [facility] - If remains inpatient will need PFT's. If not will get them as outpatient.  - Does not need to be kept inpatient from pulmonary standpoint. - f/u CT in 8/12 weeks for eval of nodule.   PaulGeorgann HousekeeperNP [place] Pager [phone] perform an ambulatory desat study.  If saturation 88% or lower then will need home O2.  If not then can go home without O2.  Will send an auto-immune work up.  Will need a repeat chest CT for pulmonary nodule in 8-12 wks.  Will need PFTs as outpatient.  If sats are stable with ambulation then ok to discharge from a pulmonary standpoint and further work up to be done as outpatient.  If sats do drop then obtain O2 for home [place] and f/u as outpatient.  Please insure outpatient F/U is available with PCCM prior to discharge.  PCCM NP to arrange.  PCCM  will sign off, please call back if needed.  Patient seen and examined, agree with above note.  I dictated the care and orders written for this patient under my direction.  WesaRush Farmer [phone]

## 2014-09-02 NOTE — Progress Notes (Signed)
Pt. Ambulated 300 ft. O2 saturation monitored. Dropped to a low of 88 without any O2.

## 2014-09-02 NOTE — Discharge Summary (Signed)
Discharge Summary   Patient ID: Terri Mendoza,  MRN: [id number], DOB/AGE: 03-21-1946 68 y.o.  Admit date: 08/31/2014 Discharge date: 09/02/2014  Primary Care Provider: [name] Primary Cardiologist: Dr. Radford Pax  Discharge Diagnoses Principal Problem:   Chest pain - normal stress echo 09/01/2014 Active Problems:   Pulmonary Nodule - 22mm LUL subpleural nodule seen on CTA of chest   Migraine without aura   Allergic rhinitis   Osteoporosis   DOE (dyspnea on exertion)   Allergies Allergies  Allergen Reactions  . Iohexol Hives and Swelling    Procedures  Stress Echo 09/01/2014 Baseline:  - LV size was normal. - LV global systolic function was normal. The estimated LV ejection fraction was 65%. - Normal wall motion; no LV regional wall motion abnormalities.  Peak stress:  - LV size was appropriately decreased from the prior stage. - LV global systolic function was hyperdynamic. The estimated LV ejection fraction was 75%. - Normal wall motion; no LV regional wall motion abnormalities.    Hospital Course  The patient is a 68 year old female with history of migraine and remote history of GERD. She has been very active and went hiking last weekend which she does often without any problem. Last Saturday, she noticed she could not hike as much due to dyspnea on exertion which is very unusual for her. On Wednesday, she started having chest tightness below the left shoulder which is mild however has been constant since that time.   She presented to [facility] on 08/31/2014 with persistent chest tightness for the past 4 days. Initial d-dimer was elevated, however due to contrast dye allergy, a VQ scan was ordered which has low probability for PE. Given persistent atypical chest discomfort, she underwent stress echo on 09/01/2014 which showed EF 65%, no obvious regional wall motion abnormality at baseline. With exertion, EF 75%, continue to be without any regional  wall motion abnormalities. Serial troponin overnight has also been negative. EKG showed normal sinus rhythm with nonspecific ST-T wave changes. Given her persistent shortness of breath with exertion, it was decided to obtain CTA of the chest despite low risk V/Q scan to completely rule out pulmonary emboli. Patient was pretreated with prednisone and Benadryl and underwent CTA of the chest in the morning of 09/02/2014. CT of the chest noted probable thrombosis of the right internal jugular vein which warrant further evaluation via Doppler ultrasound, and also 4 mm subpleural nodule seen in the left upper lobe, repeat CT was recommended in the future. Patient was offered to discharge and follow-up with pulmonology as outpatient versus inpatient pulmonology consult. At first, she wanted to follow-up as outpatient, however she change her mind later prior to discharge. We consulted pulmonology group on 09/02/2014. Per pulmonology recommendation, patient will need a repeat CT of the chest for evaluation of left upper lobe subpleural nodule in 8-12 weeks. It is unclear what caused her dyspnea, we have performed a ambulatory desats study. Initial study shows that her O2 saturation does dip down to 88% with ambulation. However patient denies any significant shortness of breath during ambulation, since she does not have any qualifying diagnosis for her insurance to cover home O2, she would pay out of pocket for >$300. After discussing with patient, the ambulatory saturation study was repeated. The patient passed a second ambulatory saturation study with O2 saturation persistently greater than 92% with ambulation. Right jugular vein ultrasound was obtained which was negative for any significant DVT.  Patient was seen post ambulation, at which  time she denies any significant shortness of breath. She is deemed stable for discharge from cardiology perspective. Follow-up with cardiology group and pulmonology group has been  scheduled. The only new medication during this hospitalization his aspirin.   Discharge Vitals Blood pressure 98/48, pulse 66, temperature 97.8 F (36.6 C), temperature source Oral, resp. rate 18, height 5\' 5"  (1.651 m), weight 162 lb 9.6 oz (73.755 kg), SpO2 97 %.  Filed Weights   08/31/14 2108 09/01/14 0548 09/02/14 0524  Weight: 163 lb 8 oz (74.163 kg) 163 lb (73.936 kg) 162 lb 9.6 oz (73.755 kg)    Labs  CBC  Recent Labs  08/31/14 1224 09/01/14 0327 09/02/14 0427  WBC 6.8 8.4 4.5  NEUTROABS 4.7  --   --   HGB 14.8 14.2 14.5  HCT 45.4 43.7 44.4  MCV 92.1 91.8 91.4  PLT 241 237 354   Basic Metabolic Panel  Recent Labs  08/31/14 1224 09/01/14 0327  NA 140 139  K 3.9 4.2  CL 100 100  CO2 24 26  GLUCOSE 95 106*  BUN 15 18  CREATININE 0.46* 0.59  CALCIUM 10.1 9.1   Cardiac Enzymes  Recent Labs  08/31/14 2154 09/01/14 0327 09/01/14 1445  TROPONINI <0.30 <0.30 <0.30   BNP Invalid input(s): POCBNP D-Dimer  Recent Labs  08/31/14 1224  DDIMER 1.80*   Hemoglobin A1C  Recent Labs  08/31/14 2154  HGBA1C 5.9*   Fasting Lipid Panel  Recent Labs  09/01/14 0327  CHOL 169  HDL 40  LDLCALC 110*  TRIG 97  CHOLHDL 4.2    Disposition  Pt is being discharged home today in good condition.  Follow-up Plans & Appointments      Follow-up Information    Follow up with PARRETT,TAMMY, NP On 09/09/2014.   Specialty:  Nurse Practitioner   Why:  11:30 AM - [facility]   Contact information:   [address] [address] [phone]       Follow up with Ermalinda Barrios, PA-C On 09/14/2014.   Specialty:  Cardiology   Why:  10:15am   Contact information:   [address] [place] [address] [phone]       Discharge Medications    Medication List    TAKE these medications        ADVIL COLD & SINUS LIQUI-GELS PO  Take 1 tablet by mouth 2 (two) times daily as needed (cold symptoms).     aspirin 81 MG  EC tablet  Take 1 tablet (81 mg total) by mouth daily.     EPIPEN 2-PAK 0.3 mg/0.3 mL Soaj injection  Generic drug:  EPINEPHrine  INJECT 0.3 MLS INTO THE MUSCLE ONCE.     fish oil-omega-3 fatty acids 1000 MG capsule  Take 1 g by mouth daily.     ibuprofen 200 MG tablet  Commonly known as:  ADVIL,MOTRIN  Take 600 mg by mouth every 6 (six) hours as needed. For pain.     OSTEO BI-FLEX ADV TRIPLE ST Tabs  Take 1 tablet by mouth daily.     pseudoephedrine-guaifenesin 60-600 MG per tablet  Commonly known as:  MUCINEX D  Take 1 tablet by mouth every 12 (twelve) hours.     vitamin C 1000 MG tablet  Take 1,000 mg by mouth daily.        Outstanding Labs/Studies  Followup CT of chest for Pulmonary nodule in 8-12 weeks and followup with pulmonology Pending rheumatology workup  Duration of  Discharge Encounter   Greater than 30 minutes including physician time.  Hilbert Corrigan PA-C Pager: [phone] 09/02/2014, 4:22 PM

## 2014-09-02 NOTE — Progress Notes (Addendum)
Patient Name: Terri Mendoza Date of Encounter: 09/02/2014     Active Problems:   Chest pain   DOE (dyspnea on exertion)    SUBJECTIVE  Went down for CTA of chest this morning. Continue to have 4/10 pressure near L shoulder. Mild SOB, improving. States her daughter has DVT and PE, tested positive for Factor V Leiden deficiency.  CURRENT MEDS . aspirin EC  81 mg Oral Daily    OBJECTIVE  Filed Vitals:   09/01/14 0548 09/01/14 1749 09/01/14 2125 09/02/14 0524  BP: 111/60 94/58 100/58 115/55  Pulse: 64  67 71  Temp: 98.3 F (36.8 C)  98.1 F (36.7 C) 97.4 F (36.3 C)  TempSrc: Oral  Oral Oral  Resp: 18 14 18 18   Height:      Weight: 163 lb (73.936 kg)   162 lb 9.6 oz (73.755 kg)  SpO2: 95% 96% 95% 95%    Intake/Output Summary (Last 24 hours) at 09/02/14 0928 Last data filed at 09/01/14 1800  Gross per 24 hour  Intake  84.53 ml  Output      0 ml  Net  84.53 ml   Filed Weights   08/31/14 2108 09/01/14 0548 09/02/14 0524  Weight: 163 lb 8 oz (74.163 kg) 163 lb (73.936 kg) 162 lb 9.6 oz (73.755 kg)    PHYSICAL EXAM  General: Pleasant, NAD. Neuro: Alert and oriented X 3. Moves all extremities spontaneously. Psych: Normal affect. HEENT:  Normal  Neck: Supple without bruits or JVD. Lungs:  Resp regular and unlabored, mild basilar rale Heart: RRR no s3, s4. 1/6 systolic murmur Abdomen: Soft, non-tender, non-distended, BS + x 4.  Extremities: No clubbing, cyanosis or edema. DP/PT/Radials 2+ and equal bilaterally.  Accessory Clinical Findings  CBC  Recent Labs  08/31/14 1224 09/01/14 0327 09/02/14 0427  WBC 6.8 8.4 4.5  NEUTROABS 4.7  --   --   HGB 14.8 14.2 14.5  HCT 45.4 43.7 44.4  MCV 92.1 91.8 91.4  PLT 241 237 174   Basic Metabolic Panel  Recent Labs  08/31/14 1224 09/01/14 0327  NA 140 139  K 3.9 4.2  CL 100 100  CO2 24 26  GLUCOSE 95 106*  BUN 15 18  CREATININE 0.46* 0.59  CALCIUM 10.1 9.1   Cardiac Enzymes  Recent Labs  08/31/14 2154 09/01/14 0327 09/01/14 1445  TROPONINI <0.30 <0.30 <0.30   BNP Invalid input(s): POCBNP D-Dimer  Recent Labs  08/31/14 1224  DDIMER 1.80*   Hemoglobin A1C  Recent Labs  08/31/14 2154  HGBA1C 5.9*   Fasting Lipid Panel  Recent Labs  09/01/14 0327  CHOL 169  HDL 40  LDLCALC 110*  TRIG 97  CHOLHDL 4.2    TELE NSR with HR 60-80s, occasional HR in 100s    ECG  No new EKG  CT of chest  IMPRESSION: No evidence of pulmonary embolus.  Mild bilateral posterior basilar subsegmental atelectasis.  Probable thrombosis of right internal jugular vein is noted; Doppler evaluation is recommended for confirmation.  4 mm subpleural nodule is seen in the left upper lobe. If the patient is at high risk for bronchogenic carcinoma, follow-up chest CT at 1 year is recommended. If the patient is at low risk, no follow-up is needed. This recommendation follows the consensus statement: Guidelines for Management of Small Pulmonary Nodules Detected on CT Scans: A Statement from the [place] as published in Radiology 2005; 237:395-400.    Radiology/Studies  Dg Chest 2 View  08/31/2014   CLINICAL DATA:  Shortness of breath for 8 days. Chest pressure on the left.  EXAM: CHEST  2 VIEW  COMPARISON:  None.  FINDINGS: Linear opacities are seen in the lung bases bilaterally. Punctate calcified granuloma is seen in the left upper lobe. Heart size is upper normal. No pneumothorax or pleural effusion.  IMPRESSION: Linear bibasilar airspace opacities are most consistent with atelectasis or scar.   Electronically Signed   By: Inge Rise M.D.   On: 08/31/2014 13:29   Nm Pulmonary Perf And Vent  08/31/2014   CLINICAL DATA:  Shortness of breath.  EXAM: NUCLEAR MEDICINE VENTILATION - PERFUSION LUNG SCAN  TECHNIQUE: Ventilation images were obtained in multiple projections using inhaled aerosol technetium 99 M DTPA. Perfusion images were obtained in multiple  projections after intravenous injection of Tc-27m MAA.  RADIOPHARMACEUTICALS:  40.0 mCi Tc-19m DTPA aerosol and 6.0 mCi Tc-44m MAA  COMPARISON:  None.  FINDINGS: Ventilation: No focal significant ventilation defect.  Perfusion: No significant wedge shaped peripheral perfusion defects to suggest acute pulmonary embolism.  IMPRESSION: Low probability pulmonary embolus.   Electronically Signed   By: Marcello Moores  Register   On: 08/31/2014 17:20    ASSESSMENT AND PLAN  68yo WF with a history of migraine headaches and remote history of GERD who was in her USOH until this past week. She works out a lot and went hiking last weekend which she does quite often without any problems. Last Saturday she noticed that she could not hike as much due to DOE which is very unusual for her. On Wednesday she started having chest tightness on the left side which is mild but has been constant since then  1. Atypical chest pain: persistent for >6 days  - trop negative. EKG no ST changes. V/Q scan negative. Stress echo normal EF with no wall motion abnormality both at rest and with exertion  - unlikely to be cardiac in origin. Given elevated d-dimer, went for CTA of chest to definitively r/o PE (pretreated for contrast allergy)  - CTA of chest showed possible R jugular vein thrombosis and 54mm L upper lobe subpleural nodule. Followup CT recommended.   - Will order R venous jugular U/S, although unlikely to be responsible for symptom which is on L side, possibly an incidental finding. Likely discharge to followup with PCP and possibly pulmonology regarding 62mm L upper lobe subpleural nodule. Will discuss with Dr. Stanford Breed. Consider d/c IV heparin given reassuring stress echo.   2. DOE 3. 4 mm L upper lobe subpleural nodule seen on CT  - discussed with patient, she is aware. She denies ever smoked before nor has she lived with anybody who smoke.  Signed, Almyra Deforest PA-C Pager: [phone] As above, patient seen and examined. She  continues to have some dyspnea although improved. She has persistent left upper chest heaviness that has been continuous. Her enzymes are negative. Electrocardiogram at time of presentation showed nonspecific ST changes. Stress echocardiogram was normal. Chest CT shows no pulmonary embolus or dissection. The etiology of her symptoms is not clear to me. There is some reproduction of chest pain with palpation and this could represent musculoskeletal symptoms. It would not explain her dyspnea. She is not volume overloaded on examination. I discussed options with her today. I offered to have pulmonary evaluate while here but she would prefer to pursue this as outpatient; will DC heparin and if symptoms stable with ambulation, DC later today and FU with Dr Radford Pax and primary care. R  venous jugular ultrasound pending; will need repeat noncontrast chest CT one year for pulmonary nodule. Kirk Ruths > 30 min PA and physician time D2  Patient now request pulmonary evaluation in-house. I will arrange. Etiology of dyspnea is not clear to me. Kirk Ruths

## 2014-09-02 NOTE — Progress Notes (Signed)
ANTICOAGULATION CONSULT NOTE - Follow Up Consult  Pharmacy Consult for Heparin Indication: chest pain/ACS, r/o PE  Allergies  Allergen Reactions  . Iohexol Hives and Swelling    Patient Measurements: Height: 5\' 5"  (165.1 cm) Weight: 162 lb 9.6 oz (73.755 kg) IBW/kg (Calculated) : 57 Heparin Dosing Weight: 73kg  Vital Signs: Temp: 97.4 F (36.3 C) (12/02 0524) Temp Source: Oral (12/02 0524) BP: 115/55 mmHg (12/02 0524) Pulse Rate: 71 (12/02 0524)  Labs:  Recent Labs  08/31/14 1224 08/31/14 2154  09/01/14 0327 09/01/14 1445 09/01/14 2248 09/02/14 0427  HGB 14.8  --   --  14.2  --   --  14.5  HCT 45.4  --   --  43.7  --   --  44.4  PLT 241  --   --  237  --   --  248  LABPROT  --   --   --  14.4  --   --   --   INR  --   --   --  1.11  --   --   --   HEPARINUNFRC  --   --   < > 0.45 0.18* 0.62 0.63  CREATININE 0.46*  --   --  0.59  --   --   --   TROPONINI  --  <0.30  --  <0.30 <0.30  --   --   < > = values in this interval not displayed.  Estimated Creatinine Clearance: 69.6 mL/min (by C-G formula based on Cr of 0.59).   Medications:  Heparin 1100 units/hr  Assessment: 68yof started on heparin for r/o ACS, r/o PE. Patient had stress test 12/1 with no abnormalities reported. CTA completed 12/2 AM reported no PE but probable thrombosis of R internal jugular. Heparin level (0.63) remains therapeutic - will continue current rate and follow-up Cardiology plans.  - H/H and Plts wnl - No significant bleeding reported  Goal of Therapy:  Heparin level 0.3-0.7 units/ml Monitor platelets by anticoagulation protocol: Yes   Plan:  1. Continue heparin 1100 units/hr (11 ml/hr) 2. Follow-up daily heparin level and CBC 3. Follow-up U/S and Cardiology plans for   Earleen Newport  [phone] 09/02/2014,10:18 AM

## 2014-09-02 NOTE — Progress Notes (Signed)
Limited right upper extremity venous duplex completed to evaluate right IJV.  No evidence of DVT in the jugular and subclavian veins bilaterally.

## 2014-09-03 LAB — ANA: Anti Nuclear Antibody(ANA): NEGATIVE

## 2014-09-09 ENCOUNTER — Encounter: Payer: Self-pay | Admitting: Adult Health

## 2014-09-09 ENCOUNTER — Other Ambulatory Visit (INDEPENDENT_AMBULATORY_CARE_PROVIDER_SITE_OTHER): Payer: Medicare HMO

## 2014-09-09 ENCOUNTER — Ambulatory Visit (INDEPENDENT_AMBULATORY_CARE_PROVIDER_SITE_OTHER): Payer: Medicare HMO | Admitting: Adult Health

## 2014-09-09 VITALS — BP 100/60 | HR 69 | Ht 66.0 in | Wt 164.6 lb

## 2014-09-09 DIAGNOSIS — R0609 Other forms of dyspnea: Secondary | ICD-10-CM

## 2014-09-09 DIAGNOSIS — R0600 Dyspnea, unspecified: Secondary | ICD-10-CM

## 2014-09-09 DIAGNOSIS — R911 Solitary pulmonary nodule: Secondary | ICD-10-CM

## 2014-09-09 LAB — SEDIMENTATION RATE: Sed Rate: 41 mm/hr — ABNORMAL HIGH (ref 0–22)

## 2014-09-09 NOTE — Progress Notes (Signed)
Subjective:    Patient ID: Terri Mendoza, female    DOB: 02-17-1946, 68 y.o.   MRN: [id number]  HPI 68 yo female 68 year old female with no significant medical history presented to [facility] ED 11/30 c/o chest tightness and DOE. Cardiac workup thus far has been negative. D-dimer was elevated, but CTA was negative for PE. CP is repsolved but SOB persists. PCCM asked to see for evaluation.  09/09/2014 Post Hospital follow up  Pt returns for a post hospital follow up .  Admitted last week for dyspnea.  Pt is very active and developed significant DOE. With accompanying chest tightness   She was admitted, found to have elevated D -Dimer  D/t contrast dye allergy, a VQ scan was ordered which has low probability for PE. Given persistent atypical chest discomfort, she underwent stress echo on 09/01/2014 which showed EF 65%, no obvious regional wall motion abnormality at baseline. With exertion, EF 75%, continue to be without any regional wall motion abnormalities. Serial troponin were  overnight has also been negative.  CTA of the chest  W/ pretrement for allergy die showed  probable thrombosis of the right internal jugular vein and also 4 mm subpleural nodule seen in the left upper lobe, Right jugular vein ultrasound was obtained which was negative for any significant DVT. ANA and RA factor neg. ESR was elevated ~70 She was discharged on an aspirin .  Since discharge she is feeling well. Back to exercises .  Less dyspnea , close to baseline.  She denies any chest pain, orthopnea, PND or leg swelling.     [employer] teacher Retired  Works with paints/plaster  Never smoker  Exercises extensively-tennis , hiking, swim.  Last travel in [place] in May   PMH  Psorasis    Review of Systems Constitutional:   No  weight loss, night sweats,  Fevers, chills, + fatigue, or  lassitude.  HEENT:   No headaches,  Difficulty swallowing,  Tooth/dental problems, or  Sore throat,                No sneezing,  itching, ear ache, nasal congestion, post nasal drip,   CV:  No chest pain,  Orthopnea, PND, swelling in lower extremities, anasarca, dizziness, palpitations, syncope.   GI  No heartburn, indigestion, abdominal pain, nausea, vomiting, diarrhea, change in bowel habits, loss of appetite, bloody stools.   Resp:    No chest wall deformity  Skin: no rash or lesions.  GU: no dysuria, change in color of urine, no urgency or frequency.  No flank pain, no hematuria   MS:  No joint pain or swelling.  No decreased range of motion.  No back pain.  Psych:  No change in mood or affect. No depression or anxiety.  No memory loss.         Objective:   Physical Exam GEN: A/Ox3; pleasant , NAD, elderly   HEENT:  [place]/AT,  EACs-clear, TMs-wnl, NOSE-clear, THROAT-clear, no lesions, no postnasal drip or exudate noted.   NECK:  Supple w/ fair ROM; no JVD; normal carotid impulses w/o bruits; no thyromegaly or nodules palpated; no lymphadenopathy.  RESP   Decreased BS in bases no accessory muscle use, no dullness to percussion  CARD:  RRR, no m/r/g  , no peripheral edema, pulses intact, no cyanosis or clubbing.  GI:   Soft & nt; nml bowel sounds; no organomegaly or masses detected.  Musco: Warm bil, no deformities or joint swelling noted.   Neuro:  alert, no focal deficits noted.    Skin: Warm, no lesions or rashes   CT chest 09/02/14 > No evidence of pulmonary embolus.  Mild bilateral posterior basilar subsegmental atelectasis.  Probable thrombosis of right internal jugular vein is noted; Doppler evaluation is recommended for confirmation.  4 mm subpleural nodule is seen in the left upper lobe. If the patient is at high risk for bronchogenic carcinoma, follow-up chest CT at 1 year is recommended. If the patient is at low risk, no follow-up is needed. This recommendation follows the consensus statement: Guidelines for Management of Small Pulmonary Nodules Detected on CT Scans: A  Statement from the [place] as published in Radiology 2005; 237:395-400.No evidence of pulmonary embolus.  Mild bilateral posterior basilar subsegmental atelectasis.  Probable thrombosis of right internal jugular vein is noted; Doppler evaluation is recommended for confirmation.  4 mm subpleural nodule is seen in the left upper lobe. If the patient is at high risk for bronchogenic carcinoma, follow-up chest CT at 1 year is recommended. If the patient is at low risk, no follow-up is needed. This recommendation follows the consensus statement: Guidelines for Management of Small Pulmonary Nodules Detected on CT Scans: A Statement from the [place] as published in Radiology 2005; 237:395-400.  Doppler upper extremity 09/02/14  No evidence of deep vein thrombosis involving the bilateral subclavian and internal jugular veins.     Assessment & Plan:

## 2014-09-09 NOTE — Patient Instructions (Signed)
Labs today  Advance activity as tolerated.  Follow up Dr. Elsworth Soho in 4 weeks with PFT

## 2014-09-14 ENCOUNTER — Encounter: Payer: Medicare HMO | Admitting: Physician Assistant

## 2014-09-17 DIAGNOSIS — R911 Solitary pulmonary nodule: Secondary | ICD-10-CM | POA: Insufficient documentation

## 2014-09-17 NOTE — Assessment & Plan Note (Signed)
CT showed 4 mm LUL in never smoker  Will discuss on return , rec from hospital to repeat in 3 months however could consider to repeat in 1 year since never smoker and no adenopathy-will leave to Dr. Elsworth Soho

## 2014-09-17 NOTE — Assessment & Plan Note (Signed)
DOE and chest tightness ? Etiology  Symptoms seem to be resolving with return to baseline activity tolerance  Workup has been unrevealing to date.  ESR was elevated will repeat level today .  CT did show a very small nodule ~93m in LUL , in a never smoker.  Cardiac evaluation was neg w/ enzymes and stress echo.  Will have her return for PFT

## 2014-10-14 ENCOUNTER — Other Ambulatory Visit: Payer: Self-pay | Admitting: Internal Medicine

## 2014-10-14 DIAGNOSIS — R0600 Dyspnea, unspecified: Secondary | ICD-10-CM

## 2014-10-15 ENCOUNTER — Ambulatory Visit (INDEPENDENT_AMBULATORY_CARE_PROVIDER_SITE_OTHER): Payer: Medicare HMO | Admitting: Internal Medicine

## 2014-10-15 ENCOUNTER — Encounter: Payer: Self-pay | Admitting: Pulmonary Disease

## 2014-10-15 ENCOUNTER — Ambulatory Visit (INDEPENDENT_AMBULATORY_CARE_PROVIDER_SITE_OTHER): Payer: Medicare HMO | Admitting: Pulmonary Disease

## 2014-10-15 VITALS — BP 122/70 | HR 55 | Temp 97.6°F | Ht 66.0 in | Wt 165.6 lb

## 2014-10-15 DIAGNOSIS — R0609 Other forms of dyspnea: Secondary | ICD-10-CM

## 2014-10-15 DIAGNOSIS — R911 Solitary pulmonary nodule: Secondary | ICD-10-CM

## 2014-10-15 DIAGNOSIS — R0600 Dyspnea, unspecified: Secondary | ICD-10-CM

## 2014-10-15 LAB — PULMONARY FUNCTION TEST
DL/VA % PRED: 96 %
DL/VA: 4.87 ml/min/mmHg/L
DLCO UNC: 25.5 ml/min/mmHg
DLCO unc % pred: 94 %
FEF 25-75 POST: 2.54 L/s
FEF 25-75 PRE: 2.03 L/s
FEF2575-%CHANGE-POST: 25 %
FEF2575-%PRED-PRE: 92 %
FEF2575-%Pred-Post: 115 %
FEV1-%CHANGE-POST: 5 %
FEV1-%PRED-PRE: 94 %
FEV1-%Pred-Post: 99 %
FEV1-Post: 2.55 L
FEV1-Pre: 2.42 L
FEV1FVC-%CHANGE-POST: 2 %
FEV1FVC-%PRED-PRE: 100 %
FEV6-%CHANGE-POST: 2 %
FEV6-%Pred-Post: 99 %
FEV6-%Pred-Pre: 97 %
FEV6-Post: 3.23 L
FEV6-Pre: 3.14 L
FEV6FVC-%PRED-POST: 104 %
FEV6FVC-%PRED-PRE: 104 %
FVC-%Change-Post: 2 %
FVC-%PRED-PRE: 93 %
FVC-%Pred-Post: 95 %
FVC-PRE: 3.14 L
FVC-Post: 3.23 L
POST FEV6/FVC RATIO: 100 %
Post FEV1/FVC ratio: 79 %
Pre FEV1/FVC ratio: 77 %
Pre FEV6/FVC Ratio: 100 %
RV % PRED: 65 %
RV: 1.46 L
TLC % pred: 85 %
TLC: 4.57 L

## 2014-10-15 NOTE — Patient Instructions (Signed)
Lung function test is normal You have a 35mm nodule in left upper lung -suggest 6 month FU scan in June 2016 - would need follow up for at least 2 years

## 2014-10-15 NOTE — Progress Notes (Signed)
   Subjective:    Patient ID: Terri Mendoza, female    DOB: 11-Jan-1946, 69 y.o.   MRN: [id number]  HPI  69 yo female 69 year old female with no significant medical history presented to [facility] ED 08/31/14 c/o chest tightness and DOE. Cardiac workup negative. D-dimer was elevated, but CTA was negative for PE. She was admitted, found to have elevated D -Dimer  D/t contrast dye allergy, a VQ scan was ordered which has low probability for PE. Given persistent atypical chest discomfort, she underwent stress echo on 09/01/2014 which showed EF 65%, no obvious regional wall motion abnormality at baseline. With exertion, EF 75%, continue to be without any regional wall motion abnormalities. CTA of the chest W/ pretrement for allergy die showed probable thrombosis of the right internal jugular vein and also 4 mm subpleural nodule seen in the left upper lobe, Right jugular vein ultrasound was obtained which was negative for any significant DVT. ANA and RA factor neg. ESR was elevated ~70 She was discharged on an aspirin .  Since discharge she is feeling well. She is very active playing tennis, and feels like she is back to her baseline She denies any chest pain, orthopnea, PND or leg swelling.  PFTs 10/2014- did not show any airway obstruction, lung volumes were normal, DLCO was normal at 94%.  Review of Systems neg for any significant sore throat, dysphagia, itching, sneezing, nasal congestion or excess/ purulent secretions, fever, chills, sweats, unintended wt loss, pleuritic or exertional cp, hempoptysis, orthopnea pnd or change in chronic leg swelling. Also denies presyncope, palpitations, heartburn, abdominal pain, nausea, vomiting, diarrhea or change in bowel or urinary habits, dysuria,hematuria, rash, arthralgias, visual complaints, headache, numbness weakness or ataxia.     Objective:   Physical Exam Gen. Pleasant, well-nourished, in no distress ENT - no lesions, no post nasal drip Neck: No JVD,  no thyromegaly, no carotid bruits Lungs: no use of accessory muscles, no dullness to percussion, clear without rales or rhonchi  Cardiovascular: Rhythm regular, heart sounds  normal, no murmurs or gallops, no peripheral edema Musculoskeletal: No deformities, no cyanosis or clubbing         Assessment & Plan:

## 2014-10-15 NOTE — Progress Notes (Signed)
PFT done today. 

## 2014-10-15 NOTE — Assessment & Plan Note (Signed)
Favor benign etiology in this never smoker, but will need two-year follow-up Would obtain first follow-up scan in 6 months-June 2016. If stable, would obtain annual follow-up.

## 2014-10-15 NOTE — Assessment & Plan Note (Signed)
This has resolved, as has her hypoxia Hospital workup was inconclusive, only thing I could see were some infiltrates bibasal, which could have been related to atelectasis or a viral pneumonitis. Seems like she improved without antibiotics.

## 2014-10-26 ENCOUNTER — Ambulatory Visit (INDEPENDENT_AMBULATORY_CARE_PROVIDER_SITE_OTHER): Payer: Medicare HMO | Admitting: Family Medicine

## 2014-10-26 ENCOUNTER — Other Ambulatory Visit (HOSPITAL_COMMUNITY)
Admission: RE | Admit: 2014-10-26 | Discharge: 2014-10-26 | Disposition: A | Discharge status: Home / Self Care | Payer: Medicare HMO | Source: Ambulatory Visit | Attending: Family Medicine | Admitting: Family Medicine

## 2014-10-26 ENCOUNTER — Encounter: Payer: Self-pay | Admitting: Family Medicine

## 2014-10-26 VITALS — Ht 65.0 in | Wt 164.8 lb

## 2014-10-26 DIAGNOSIS — Z1151 Encounter for screening for human papillomavirus (HPV): Secondary | ICD-10-CM | POA: Insufficient documentation

## 2014-10-26 DIAGNOSIS — Z124 Encounter for screening for malignant neoplasm of cervix: Secondary | ICD-10-CM | POA: Insufficient documentation

## 2014-10-26 DIAGNOSIS — Z23 Encounter for immunization: Secondary | ICD-10-CM

## 2014-10-26 DIAGNOSIS — R87619 Unspecified abnormal cytological findings in specimens from cervix uteri: Secondary | ICD-10-CM | POA: Insufficient documentation

## 2014-10-26 DIAGNOSIS — Z0000 Encounter for general adult medical examination without abnormal findings: Secondary | ICD-10-CM

## 2014-10-26 DIAGNOSIS — R300 Dysuria: Secondary | ICD-10-CM

## 2014-10-26 NOTE — Progress Notes (Deleted)
Subjective:     Terri Mendoza is a 69 y.o. female and is here for a comprehensive physical exam. The patient reports no problems.  History   Social History  . Marital Status: Married    Spouse Name: N/A    Number of Children: N/A  . Years of Education: N/A   Occupational History  . Not on file.   Social History Main Topics  . Smoking status: Never Smoker   . Smokeless tobacco: Never Used  . Alcohol Use: 2.4 oz/week    4 Glasses of wine per week  . Drug Use: No  . Sexual Activity:    Partners: Male   Other Topics Concern  . Not on file   Social History Narrative   Health Maintenance  Topic Date Due  . PNEUMOCOCCAL POLYSACCHARIDE VACCINE AGE 6 AND OVER  07/01/2013  . INFLUENZA VACCINE  12/31/2014 (Originally 05/02/2014)  . MAMMOGRAM  08/12/2015  . TETANUS/TDAP  01/18/2016  . COLONOSCOPY  05/01/2023  . DEXA SCAN  Completed  . ZOSTAVAX  Completed    The following portions of the patient's history were reviewed and updated as appropriate:  She  has a past medical history of Heart murmur; Allergy; Migraines; Osteopenia; Atypical chest pain (08/31/2014); and Pulmonary nodule (09/02/2014). She  does not have any pertinent problems on file. She  has past surgical history that includes Colonoscopy. Her family history includes CAD in her father; Cancer (age of onset: 71) in her mother; Colon cancer (age of onset: 102) in her mother; Coronary artery disease in an other family member; Heart disease (age of onset: 52) in her father; Heart disease (age of onset: 73) in her mother; Heart failure in an other family member. She  reports that she has never smoked. She has never used smokeless tobacco. She reports that she drinks about 2.4 oz of alcohol per week. She reports that she does not use illicit drugs. She has a current medication list which includes the following prescription(s): vitamin c, aspirin, diphenhydramine-acetaminophen, epipen 2-pak, fish oil-omega-3 fatty acids,  ibuprofen, and osteo bi-flex adv triple st. Current Outpatient Prescriptions on File Prior to Visit  Medication Sig Dispense Refill  . Ascorbic Acid (VITAMIN C) 1000 MG tablet Take 1,000 mg by mouth daily.     Marland Kitchen aspirin EC 81 MG EC tablet Take 1 tablet (81 mg total) by mouth daily.    . diphenhydramine-acetaminophen (TYLENOL PM) 25-500 MG TABS Take 1 tablet by mouth at bedtime as needed.    Marland Kitchen EPIPEN 2-PAK 0.3 MG/0.3ML SOAJ injection INJECT 0.3 MLS INTO THE MUSCLE ONCE. 2 mL 0  . fish oil-omega-3 fatty acids 1000 MG capsule Take 1 g by mouth daily.     Marland Kitchen ibuprofen (ADVIL,MOTRIN) 200 MG tablet Take 600 mg by mouth every 6 (six) hours as needed. For pain.    . Misc Natural Products (OSTEO BI-FLEX ADV TRIPLE ST) TABS Take 1 tablet by mouth daily.      No current facility-administered medications on file prior to visit.   She is allergic to iohexol..  Review of Systems Review of Systems  Constitutional: Negative for activity change, appetite change and fatigue.  HENT: Negative for hearing loss, congestion, tinnitus and ear discharge.  dentist q74m Eyes: Negative for visual disturbance (see optho q1y -- vision corrected to 20/20 with glasses).  Respiratory: Negative for cough, chest tightness and shortness of breath.   Cardiovascular: Negative for chest pain, palpitations and leg swelling.  Gastrointestinal: Negative for abdominal pain, diarrhea,  constipation and abdominal distention.  Genitourinary: Negative for urgency, frequency, decreased urine volume and difficulty urinating.  Musculoskeletal: Negative for back pain, arthralgias and gait problem.  Skin: Negative for color change, pallor and rash.  Neurological: Negative for dizziness, light-headedness, numbness and headaches.  Hematological: Negative for adenopathy. Does not bruise/bleed easily.  Psychiatric/Behavioral: Negative for suicidal ideas, confusion, sleep disturbance, self-injury, dysphoric mood, decreased concentration and  agitation.       Objective:    {Exam, Complete:[phone]}    Assessment:    Healthy female exam. ***     Plan:     See After Visit Summary for Counseling Recommendations

## 2014-10-26 NOTE — Progress Notes (Signed)
Subjective:    Terri Mendoza is a 69 y.o. female who presents for Medicare Initial preventive examination.  Preventive Screening-Counseling & Management  Tobacco History  Smoking status  . Never Smoker   Smokeless tobacco  . Never Used     Problems Prior to Visit 1. none Current Problems (verified) Patient Active Problem List   Diagnosis Date Noted  . Lung nodule 09/17/2014  . DOE (dyspnea on exertion) 08/31/2014  . Osteoporosis 10/25/2011  . Migraine without aura 01/13/2010  . Allergic rhinitis 01/13/2010    Medications Prior to Visit Current Outpatient Prescriptions on File Prior to Visit  Medication Sig Dispense Refill  . Ascorbic Acid (VITAMIN C) 1000 MG tablet Take 1,000 mg by mouth daily.     Marland Kitchen aspirin EC 81 MG EC tablet Take 1 tablet (81 mg total) by mouth daily.    . diphenhydramine-acetaminophen (TYLENOL PM) 25-500 MG TABS Take 1 tablet by mouth at bedtime as needed.    Marland Kitchen EPIPEN 2-PAK 0.3 MG/0.3ML SOAJ injection INJECT 0.3 MLS INTO THE MUSCLE ONCE. 2 mL 0  . fish oil-omega-3 fatty acids 1000 MG capsule Take 1 g by mouth daily.     Marland Kitchen ibuprofen (ADVIL,MOTRIN) 200 MG tablet Take 600 mg by mouth every 6 (six) hours as needed. For pain.    . Misc Natural Products (OSTEO BI-FLEX ADV TRIPLE ST) TABS Take 1 tablet by mouth daily.      No current facility-administered medications on file prior to visit.    Current Medications (verified) Current Outpatient Prescriptions  Medication Sig Dispense Refill  . Ascorbic Acid (VITAMIN C) 1000 MG tablet Take 1,000 mg by mouth daily.     Marland Kitchen aspirin EC 81 MG EC tablet Take 1 tablet (81 mg total) by mouth daily.    . diphenhydramine-acetaminophen (TYLENOL PM) 25-500 MG TABS Take 1 tablet by mouth at bedtime as needed.    Marland Kitchen EPIPEN 2-PAK 0.3 MG/0.3ML SOAJ injection INJECT 0.3 MLS INTO THE MUSCLE ONCE. 2 mL 0  . fish oil-omega-3 fatty acids 1000 MG capsule Take 1 g by mouth daily.     Marland Kitchen ibuprofen (ADVIL,MOTRIN) 200 MG tablet  Take 600 mg by mouth every 6 (six) hours as needed. For pain.    . Misc Natural Products (OSTEO BI-FLEX ADV TRIPLE ST) TABS Take 1 tablet by mouth daily.      No current facility-administered medications for this visit.     Allergies (verified) Iohexol   PAST HISTORY  Family History Family History  Problem Relation Age of Onset  . Heart failure      CHF  . Coronary artery disease    . Heart disease Mother 74    chf,  valve repair  . Cancer Mother 59    colon  . Colon cancer Mother 56  . Heart disease Father 57    chf  . CAD Father   . Factor V Leiden deficiency Daughter     Social History History  Substance Use Topics  . Smoking status: Never Smoker   . Smokeless tobacco: Never Used  . Alcohol Use: 2.4 oz/week    4 Glasses of wine per week     Are there smokers in your home (other than you)? No  Risk Factors Current exercise habits: walking , pickle ball  Dietary issues discussed: none   Cardiac risk factors: advanced age (older than 13 for men, 53 for women).  Depression Screen (Note: if answer to either of the following is "Yes", a  more complete depression screening is indicated)   Over the past 2 weeks, have you felt down, depressed or hopeless? No  Over the past 2 weeks, have you felt little interest or pleasure in doing things? No  Have you lost interest or pleasure in daily life? No  Do you often feel hopeless? No  Do you cry easily over simple problems? No  Activities of Daily Living In your present state of health, do you have any difficulty performing the following activities?:  Driving? No Managing money?  No Feeding yourself? No Getting from bed to chair? No Climbing a flight of stairs? No Preparing food and eating?: No Bathing or showering? No Getting dressed: No Getting to the toilet? No Using the toilet:No Moving around from place to place: No In the past year have you fallen or had a near fall?:No   Are you sexually active?  Yes  Do  you have more than one partner?  No  Hearing Difficulties: Yes Do you often ask people to speak up or repeat themselves? Yes Do you experience ringing or noises in your ears? No Do you have difficulty understanding soft or whispered voices? Yes   Do you feel that you have a problem with memory? No  Do you often misplace items? No  Do you feel safe at home?  No  Cognitive Testing  Alert? Yes  Normal Appearance?Yes  Oriented to person? Yes  Place? Yes   Time? Yes  Recall of three objects?  Yes  Can perform simple calculations? Yes  Displays appropriate judgment?Yes  Can read the correct time from a watch face?Yes   Advanced Directives have been discussed with the patient? Yes  List the Names of Other Physician/Practitioners you currently use: 1.  opth-- 2. Dentist 3.  derm Indicate any recent Medical Services you may have received from other than [facility] providers in the past year (date may be approximate).  Immunization History  Administered Date(s) Administered  . Pneumococcal Conjugate-13 10/26/2014  . Td 01/17/2006  . Zoster 08/07/2011    Screening Tests Health Maintenance  Topic Date Due  . PNEUMOCOCCAL POLYSACCHARIDE VACCINE AGE 46 AND OVER  07/01/2013  . INFLUENZA VACCINE  12/31/2014 (Originally 05/02/2014)  . MAMMOGRAM  08/12/2015  . TETANUS/TDAP  01/18/2016  . COLONOSCOPY  05/01/2023  . DEXA SCAN  Completed  . ZOSTAVAX  Completed    All answers were reviewed with the patient and necessary referrals were made:  Terri Koyanagi, DO   10/26/2014   History reviewed:  She  has a past medical history of Heart murmur; Allergy; Migraines; Osteopenia; Atypical chest pain (08/31/2014); and Pulmonary nodule (09/02/2014). She  does not have any pertinent problems on file. She  has past surgical history that includes Colonoscopy. Her family history includes CAD in her father; Cancer (age of onset: 65) in her mother; Colon cancer (age of onset: 15) in her mother; Coronary artery  disease in an other family member; Factor V Leiden deficiency in her daughter; Heart disease (age of onset: 82) in her father; Heart disease (age of onset: 58) in her mother; Heart failure in an other family member. She  reports that she has never smoked. She has never used smokeless tobacco. She reports that she drinks about 2.4 oz of alcohol per week. She reports that she does not use illicit drugs. She has a current medication list which includes the following prescription(s): vitamin c, aspirin, diphenhydramine-acetaminophen, epipen 2-pak, fish oil-omega-3 fatty acids, ibuprofen, and osteo bi-flex adv triple  st. Current Outpatient Prescriptions on File Prior to Visit  Medication Sig Dispense Refill  . Ascorbic Acid (VITAMIN C) 1000 MG tablet Take 1,000 mg by mouth daily.     Marland Kitchen aspirin EC 81 MG EC tablet Take 1 tablet (81 mg total) by mouth daily.    . diphenhydramine-acetaminophen (TYLENOL PM) 25-500 MG TABS Take 1 tablet by mouth at bedtime as needed.    Marland Kitchen EPIPEN 2-PAK 0.3 MG/0.3ML SOAJ injection INJECT 0.3 MLS INTO THE MUSCLE ONCE. 2 mL 0  . fish oil-omega-3 fatty acids 1000 MG capsule Take 1 g by mouth daily.     Marland Kitchen ibuprofen (ADVIL,MOTRIN) 200 MG tablet Take 600 mg by mouth every 6 (six) hours as needed. For pain.    . Misc Natural Products (OSTEO BI-FLEX ADV TRIPLE ST) TABS Take 1 tablet by mouth daily.      No current facility-administered medications on file prior to visit.   She is allergic to iohexol.  Review of Systems  Review of Systems  Constitutional: Negative for activity change, appetite change and fatigue.  HENT: Negative for hearing loss, congestion, tinnitus and ear discharge.   Eyes: Negative for visual disturbance (see optho q1y -- vision corrected to 20/20 with glasses).  Respiratory: Negative for cough, chest tightness and shortness of breath.   Cardiovascular: Negative for chest pain, palpitations and leg swelling.  Gastrointestinal: Negative for abdominal pain,  diarrhea, constipation and abdominal distention.  Genitourinary: Negative for urgency, frequency, decreased urine volume and difficulty urinating.  Musculoskeletal: Negative for back pain, arthralgias and gait problem.  Skin: Negative for color change, pallor and rash.  Neurological: Negative for dizziness, light-headedness, numbness and headaches.  Hematological: Negative for adenopathy. Does not bruise/bleed easily.  Psychiatric/Behavioral: Negative for suicidal ideas, confusion, sleep disturbance, self-injury, dysphoric mood, decreased concentration and agitation.  Pt is able to read and write and can do all ADLs No risk for falling No abuse/ violence in home      Objective:     Vision by Snellen chart:opth  Body mass index is 27.42 kg/(m^2). Ht 5\' 5"  (1.651 m)  Wt 164 lb 12.8 oz (74.753 kg)  BMI 27.42 kg/m2  SpO2 95%  Ht 5\' 5"  (1.651 m)  Wt 164 lb 12.8 oz (74.753 kg)  BMI 27.42 kg/m2  SpO2 95% General appearance: alert, cooperative, appears stated age and no distress Head: Normocephalic, without obvious abnormality, atraumatic Eyes: negative findings: lids and lashes normal and conjunctivae and sclerae normal Ears: normal TM's and external ear canals both ears Nose: Nares normal. Septum midline. Mucosa normal. No drainage or sinus tenderness. Throat: lips, mucosa, and tongue normal; teeth and gums normal Neck: no adenopathy, no carotid bruit, no JVD, supple, symmetrical, trachea midline and thyroid not enlarged, symmetric, no tenderness/mass/nodules Back: symmetric, no curvature. ROM normal. No CVA tenderness. Lungs: clear to auscultation bilaterally Breasts: normal appearance, no masses or tenderness Heart: regular rate and rhythm, S1, S2 normal, no murmur, click, rub or gallop Abdomen: soft, non-tender; bowel sounds normal; no masses,  no organomegaly Pelvic: cervix normal in appearance, external genitalia normal, no adnexal masses or tenderness, no cervical motion  tenderness, rectovaginal septum normal, uterus normal size, shape, and consistency, vagina normal without discharge and pap done, heme neg brown stool Extremities: extremities normal, atraumatic, no cyanosis or edema Pulses: 2+ and symmetric Skin: Skin color, texture, turgor normal. No rashes or lesions Lymph nodes: Cervical, supraclavicular, and axillary nodes normal. Neurologic: Alert and oriented X 3, normal strength and tone. Normal symmetric reflexes.  Normal coordination and gait Psych- no depression, no anxiety       Assessment:     cpe      Plan:     During the course of the visit the patient was educated and counseled about appropriate screening and preventive services including:    Pneumococcal vaccine   Influenza vaccine  Screening mammography  Screening Pap smear and pelvic exam   Bone densitometry screening  Colorectal cancer screening  Diabetes screening  Glaucoma screening  Advanced directives: has an advanced directive - a copy HAS NOT been provided.  Diet review for nutrition referral? Yes ____  Not Indicated __x__   Patient Instructions (the written plan) was given to the patient.  Medicare Attestation I have personally reviewed: The patient's medical and social history Their use of alcohol, tobacco or illicit drugs Their current medications and supplements The patient's functional ability including ADLs,fall risks, home safety risks, cognitive, and hearing and visual impairment Diet and physical activities Evidence for depression or mood disorders  The patient's weight, height, BMI, and visual acuity have been recorded in the chart.  I have made referrals, counseling, and provided education to the patient based on review of the above and I have provided the patient with a written personalized care plan for preventive services.    1. Dysuria Check urine  - Urinalysis; Future - Urinalysis  2. Need for vaccination with 13-polyvalent  pneumococcal conjugate vaccine   - Pneumococcal conjugate vaccine 13-valent IM  3. Preventative health care   - Cytology - PAP  4. Encounter for screening for malignant neoplasm of cervix   - Cytology - PAP  Terri Koyanagi, DO   10/26/2014

## 2014-10-26 NOTE — Progress Notes (Signed)
Pre visit review using our clinic review tool, if applicable. No additional management support is needed unless otherwise documented below in the visit note. 

## 2014-10-26 NOTE — Patient Instructions (Signed)
Preventive Care for Adults A healthy lifestyle and preventive care can promote health and wellness. Preventive health guidelines for women include the following key practices.  A routine yearly physical is a good way to check with your health care provider about your health and preventive screening. It is a chance to share any concerns and updates on your health and to receive a thorough exam.  Visit your dentist for a routine exam and preventive care every 6 months. Brush your teeth twice a day and floss once a day. Good oral hygiene prevents tooth decay and gum disease.  The frequency of eye exams is based on your age, health, family medical history, use of contact lenses, and other factors. Follow your health care provider's recommendations for frequency of eye exams.  Eat a healthy diet. Foods like vegetables, fruits, whole grains, low-fat dairy products, and lean protein foods contain the nutrients you need without too many calories. Decrease your intake of foods high in solid fats, added sugars, and salt. Eat the right amount of calories for you.Get information about a proper diet from your health care provider, if necessary.  Regular physical exercise is one of the most important things you can do for your health. Most adults should get at least 150 minutes of moderate-intensity exercise (any activity that increases your heart rate and causes you to sweat) each week. In addition, most adults need muscle-strengthening exercises on 2 or more days a week.  Maintain a healthy weight. The body mass index (BMI) is a screening tool to identify possible weight problems. It provides an estimate of body fat based on height and weight. Your health care provider can find your BMI and can help you achieve or maintain a healthy weight.For adults 20 years and older:  A BMI below 18.5 is considered underweight.  A BMI of 18.5 to 24.9 is normal.  A BMI of 25 to 29.9 is considered overweight.  A BMI of  30 and above is considered obese.  Maintain normal blood lipids and cholesterol levels by exercising and minimizing your intake of saturated fat. Eat a balanced diet with plenty of fruit and vegetables. Blood tests for lipids and cholesterol should begin at age 76 and be repeated every 5 years. If your lipid or cholesterol levels are high, you are over 50, or you are at high risk for heart disease, you may need your cholesterol levels checked more frequently.Ongoing high lipid and cholesterol levels should be treated with medicines if diet and exercise are not working.  If you smoke, find out from your health care provider how to quit. If you do not use tobacco, do not start.  Lung cancer screening is recommended for adults aged 22-80 years who are at high risk for developing lung cancer because of a history of smoking. A yearly low-dose CT scan of the lungs is recommended for people who have at least a 30-pack-year history of smoking and are a current smoker or have quit within the past 15 years. A pack year of smoking is smoking an average of 1 pack of cigarettes a day for 1 year (for example: 1 pack a day for 30 years or 2 packs a day for 15 years). Yearly screening should continue until the smoker has stopped smoking for at least 15 years. Yearly screening should be stopped for people who develop a health problem that would prevent them from having lung cancer treatment.  If you are pregnant, do not drink alcohol. If you are breastfeeding,  be very cautious about drinking alcohol. If you are not pregnant and choose to drink alcohol, do not have more than 1 drink per day. One drink is considered to be 12 ounces (355 mL) of beer, 5 ounces (148 mL) of wine, or 1.5 ounces (44 mL) of liquor.  Avoid use of street drugs. Do not share needles with anyone. Ask for help if you need support or instructions about stopping the use of drugs.  High blood pressure causes heart disease and increases the risk of  stroke. Your blood pressure should be checked at least every 1 to 2 years. Ongoing high blood pressure should be treated with medicines if weight loss and exercise do not work.  If you are 75-52 years old, ask your health care provider if you should take aspirin to prevent strokes.  Diabetes screening involves taking a blood sample to check your fasting blood sugar level. This should be done once every 3 years, after age 15, if you are within normal weight and without risk factors for diabetes. Testing should be considered at a younger age or be carried out more frequently if you are overweight and have at least 1 risk factor for diabetes.  Breast cancer screening is essential preventive care for women. You should practice "breast self-awareness." This means understanding the normal appearance and feel of your breasts and may include breast self-examination. Any changes detected, no matter how small, should be reported to a health care provider. Women in their 58s and 30s should have a clinical breast exam (CBE) by a health care provider as part of a regular health exam every 1 to 3 years. After age 16, women should have a CBE every year. Starting at age 53, women should consider having a mammogram (breast X-ray test) every year. Women who have a family history of breast cancer should talk to their health care provider about genetic screening. Women at a high risk of breast cancer should talk to their health care providers about having an MRI and a mammogram every year.  Breast cancer gene (BRCA)-related cancer risk assessment is recommended for women who have family members with BRCA-related cancers. BRCA-related cancers include breast, ovarian, tubal, and peritoneal cancers. Having family members with these cancers may be associated with an increased risk for harmful changes (mutations) in the breast cancer genes BRCA1 and BRCA2. Results of the assessment will determine the need for genetic counseling and  BRCA1 and BRCA2 testing.  Routine pelvic exams to screen for cancer are no longer recommended for nonpregnant women who are considered low risk for cancer of the pelvic organs (ovaries, uterus, and vagina) and who do not have symptoms. Ask your health care provider if a screening pelvic exam is right for you.  If you have had past treatment for cervical cancer or a condition that could lead to cancer, you need Pap tests and screening for cancer for at least 20 years after your treatment. If Pap tests have been discontinued, your risk factors (such as having a new sexual partner) need to be reassessed to determine if screening should be resumed. Some women have medical problems that increase the chance of getting cervical cancer. In these cases, your health care provider may recommend more frequent screening and Pap tests.  The HPV test is an additional test that may be used for cervical cancer screening. The HPV test looks for the virus that can cause the cell changes on the cervix. The cells collected during the Pap test can be  tested for HPV. The HPV test could be used to screen women aged 30 years and older, and should be used in women of any age who have unclear Pap test results. After the age of 30, women should have HPV testing at the same frequency as a Pap test.  Colorectal cancer can be detected and often prevented. Most routine colorectal cancer screening begins at the age of 50 years and continues through age 75 years. However, your health care provider may recommend screening at an earlier age if you have risk factors for colon cancer. On a yearly basis, your health care provider may provide home test kits to check for hidden blood in the stool. Use of a small camera at the end of a tube, to directly examine the colon (sigmoidoscopy or colonoscopy), can detect the earliest forms of colorectal cancer. Talk to your health care provider about this at age 50, when routine screening begins. Direct  exam of the colon should be repeated every 5-10 years through age 75 years, unless early forms of pre-cancerous polyps or small growths are found.  People who are at an increased risk for hepatitis B should be screened for this virus. You are considered at high risk for hepatitis B if:  You were born in a country where hepatitis B occurs often. Talk with your health care provider about which countries are considered high risk.  Your parents were born in a high-risk country and you have not received a shot to protect against hepatitis B (hepatitis B vaccine).  You have HIV or AIDS.  You use needles to inject street drugs.  You live with, or have sex with, someone who has hepatitis B.  You get hemodialysis treatment.  You take certain medicines for conditions like cancer, organ transplantation, and autoimmune conditions.  Hepatitis C blood testing is recommended for all people born from 1945 through 1965 and any individual with known risks for hepatitis C.  Practice safe sex. Use condoms and avoid high-risk sexual practices to reduce the spread of sexually transmitted infections (STIs). STIs include gonorrhea, chlamydia, syphilis, trichomonas, herpes, HPV, and human immunodeficiency virus (HIV). Herpes, HIV, and HPV are viral illnesses that have no cure. They can result in disability, cancer, and death.  You should be screened for sexually transmitted illnesses (STIs) including gonorrhea and chlamydia if:  You are sexually active and are younger than 24 years.  You are older than 24 years and your health care provider tells you that you are at risk for this type of infection.  Your sexual activity has changed since you were last screened and you are at an increased risk for chlamydia or gonorrhea. Ask your health care provider if you are at risk.  If you are at risk of being infected with HIV, it is recommended that you take a prescription medicine daily to prevent HIV infection. This is  called preexposure prophylaxis (PrEP). You are considered at risk if:  You are a heterosexual woman, are sexually active, and are at increased risk for HIV infection.  You take drugs by injection.  You are sexually active with a partner who has HIV.  Talk with your health care provider about whether you are at high risk of being infected with HIV. If you choose to begin PrEP, you should first be tested for HIV. You should then be tested every 3 months for as long as you are taking PrEP.  Osteoporosis is a disease in which the bones lose minerals and strength   with aging. This can result in serious bone fractures or breaks. The risk of osteoporosis can be identified using a bone density scan. Women ages 65 years and over and women at risk for fractures or osteoporosis should discuss screening with their health care providers. Ask your health care provider whether you should take a calcium supplement or vitamin D to reduce the rate of osteoporosis.  Menopause can be associated with physical symptoms and risks. Hormone replacement therapy is available to decrease symptoms and risks. You should talk to your health care provider about whether hormone replacement therapy is right for you.  Use sunscreen. Apply sunscreen liberally and repeatedly throughout the day. You should seek shade when your shadow is shorter than you. Protect yourself by wearing long sleeves, pants, a wide-brimmed hat, and sunglasses year round, whenever you are outdoors.  Once a month, do a whole body skin exam, using a mirror to look at the skin on your back. Tell your health care provider of new moles, moles that have irregular borders, moles that are larger than a pencil eraser, or moles that have changed in shape or color.  Stay current with required vaccines (immunizations).  Influenza vaccine. All adults should be immunized every year.  Tetanus, diphtheria, and acellular pertussis (Td, Tdap) vaccine. Pregnant women should  receive 1 dose of Tdap vaccine during each pregnancy. The dose should be obtained regardless of the length of time since the last dose. Immunization is preferred during the 27th-36th week of gestation. An adult who has not previously received Tdap or who does not know her vaccine status should receive 1 dose of Tdap. This initial dose should be followed by tetanus and diphtheria toxoids (Td) booster doses every 10 years. Adults with an unknown or incomplete history of completing a 3-dose immunization series with Td-containing vaccines should begin or complete a primary immunization series including a Tdap dose. Adults should receive a Td booster every 10 years.  Varicella vaccine. An adult without evidence of immunity to varicella should receive 2 doses or a second dose if she has previously received 1 dose. Pregnant females who do not have evidence of immunity should receive the first dose after pregnancy. This first dose should be obtained before leaving the health care facility. The second dose should be obtained 4-8 weeks after the first dose.  Human papillomavirus (HPV) vaccine. Females aged 13-26 years who have not received the vaccine previously should obtain the 3-dose series. The vaccine is not recommended for use in pregnant females. However, pregnancy testing is not needed before receiving a dose. If a female is found to be pregnant after receiving a dose, no treatment is needed. In that case, the remaining doses should be delayed until after the pregnancy. Immunization is recommended for any person with an immunocompromised condition through the age of 26 years if she did not get any or all doses earlier. During the 3-dose series, the second dose should be obtained 4-8 weeks after the first dose. The third dose should be obtained 24 weeks after the first dose and 16 weeks after the second dose.  Zoster vaccine. One dose is recommended for adults aged 60 years or older unless certain conditions are  present.  Measles, mumps, and rubella (MMR) vaccine. Adults born before 1957 generally are considered immune to measles and mumps. Adults born in 1957 or later should have 1 or more doses of MMR vaccine unless there is a contraindication to the vaccine or there is laboratory evidence of immunity to   each of the three diseases. A routine second dose of MMR vaccine should be obtained at least 28 days after the first dose for students attending postsecondary schools, health care workers, or international travelers. People who received inactivated measles vaccine or an unknown type of measles vaccine during 1963-1967 should receive 2 doses of MMR vaccine. People who received inactivated mumps vaccine or an unknown type of mumps vaccine before 1979 and are at high risk for mumps infection should consider immunization with 2 doses of MMR vaccine. For females of childbearing age, rubella immunity should be determined. If there is no evidence of immunity, females who are not pregnant should be vaccinated. If there is no evidence of immunity, females who are pregnant should delay immunization until after pregnancy. Unvaccinated health care workers born before 1957 who lack laboratory evidence of measles, mumps, or rubella immunity or laboratory confirmation of disease should consider measles and mumps immunization with 2 doses of MMR vaccine or rubella immunization with 1 dose of MMR vaccine.  Pneumococcal 13-valent conjugate (PCV13) vaccine. When indicated, a person who is uncertain of her immunization history and has no record of immunization should receive the PCV13 vaccine. An adult aged 19 years or older who has certain medical conditions and has not been previously immunized should receive 1 dose of PCV13 vaccine. This PCV13 should be followed with a dose of pneumococcal polysaccharide (PPSV23) vaccine. The PPSV23 vaccine dose should be obtained at least 8 weeks after the dose of PCV13 vaccine. An adult aged 19  years or older who has certain medical conditions and previously received 1 or more doses of PPSV23 vaccine should receive 1 dose of PCV13. The PCV13 vaccine dose should be obtained 1 or more years after the last PPSV23 vaccine dose.  Pneumococcal polysaccharide (PPSV23) vaccine. When PCV13 is also indicated, PCV13 should be obtained first. All adults aged 65 years and older should be immunized. An adult younger than age 65 years who has certain medical conditions should be immunized. Any person who resides in a nursing home or long-term care facility should be immunized. An adult smoker should be immunized. People with an immunocompromised condition and certain other conditions should receive both PCV13 and PPSV23 vaccines. People with human immunodeficiency virus (HIV) infection should be immunized as soon as possible after diagnosis. Immunization during chemotherapy or radiation therapy should be avoided. Routine use of PPSV23 vaccine is not recommended for American Indians, [place] Natives, or people younger than 65 years unless there are medical conditions that require PPSV23 vaccine. When indicated, people who have unknown immunization and have no record of immunization should receive PPSV23 vaccine. One-time revaccination 5 years after the first dose of PPSV23 is recommended for people aged 19-64 years who have chronic kidney failure, nephrotic syndrome, asplenia, or immunocompromised conditions. People who received 1-2 doses of PPSV23 before age 65 years should receive another dose of PPSV23 vaccine at age 65 years or later if at least 5 years have passed since the previous dose. Doses of PPSV23 are not needed for people immunized with PPSV23 at or after age 65 years.  Meningococcal vaccine. Adults with asplenia or persistent complement component deficiencies should receive 2 doses of quadrivalent meningococcal conjugate (MenACWY-D) vaccine. The doses should be obtained at least 2 months apart.  Microbiologists working with certain meningococcal bacteria, military recruits, people at risk during an outbreak, and people who travel to or live in countries with a high rate of meningitis should be immunized. A first-year college student up through age   21 years who is living in a residence hall should receive a dose if she did not receive a dose on or after her 16th birthday. Adults who have certain high-risk conditions should receive one or more doses of vaccine.  Hepatitis A vaccine. Adults who wish to be protected from this disease, have certain high-risk conditions, work with hepatitis A-infected animals, work in hepatitis A research labs, or travel to or work in countries with a high rate of hepatitis A should be immunized. Adults who were previously unvaccinated and who anticipate close contact with an international adoptee during the first 60 days after arrival in the [place] from a country with a high rate of hepatitis A should be immunized.  Hepatitis B vaccine. Adults who wish to be protected from this disease, have certain high-risk conditions, may be exposed to blood or other infectious body fluids, are household contacts or sex partners of hepatitis B positive people, are clients or workers in certain care facilities, or travel to or work in countries with a high rate of hepatitis B should be immunized.  Haemophilus influenzae type b (Hib) vaccine. A previously unvaccinated person with asplenia or sickle cell disease or having a scheduled splenectomy should receive 1 dose of Hib vaccine. Regardless of previous immunization, a recipient of a hematopoietic stem cell transplant should receive a 3-dose series 6-12 months after her successful transplant. Hib vaccine is not recommended for adults with HIV infection. Preventive Services / Frequency Ages 64 to 68 years  Blood pressure check.** / Every 1 to 2 years.  Lipid and cholesterol check.** / Every 5 years beginning at age  22.  Clinical breast exam.** / Every 3 years for women in their 88s and 53s.  BRCA-related cancer risk assessment.** / For women who have family members with a BRCA-related cancer (breast, ovarian, tubal, or peritoneal cancers).  Pap test.** / Every 2 years from ages 90 through 51. Every 3 years starting at age 21 through age 56 or 3 with a history of 3 consecutive normal Pap tests.  HPV screening.** / Every 3 years from ages 24 through ages 1 to 46 with a history of 3 consecutive normal Pap tests.  Hepatitis C blood test.** / For any individual with known risks for hepatitis C.  Skin self-exam. / Monthly.  Influenza vaccine. / Every year.  Tetanus, diphtheria, and acellular pertussis (Tdap, Td) vaccine.** / Consult your health care provider. Pregnant women should receive 1 dose of Tdap vaccine during each pregnancy. 1 dose of Td every 10 years.  Varicella vaccine.** / Consult your health care provider. Pregnant females who do not have evidence of immunity should receive the first dose after pregnancy.  HPV vaccine. / 3 doses over 6 months, if 72 and younger. The vaccine is not recommended for use in pregnant females. However, pregnancy testing is not needed before receiving a dose.  Measles, mumps, rubella (MMR) vaccine.** / You need at least 1 dose of MMR if you were born in 1957 or later. You may also need a 2nd dose. For females of childbearing age, rubella immunity should be determined. If there is no evidence of immunity, females who are not pregnant should be vaccinated. If there is no evidence of immunity, females who are pregnant should delay immunization until after pregnancy.  Pneumococcal 13-valent conjugate (PCV13) vaccine.** / Consult your health care provider.  Pneumococcal polysaccharide (PPSV23) vaccine.** / 1 to 2 doses if you smoke cigarettes or if you have certain conditions.  Meningococcal vaccine.** /  1 dose if you are age 19 to 21 years and a first-year college  student living in a residence hall, or have one of several medical conditions, you need to get vaccinated against meningococcal disease. You may also need additional booster doses.  Hepatitis A vaccine.** / Consult your health care provider.  Hepatitis B vaccine.** / Consult your health care provider.  Haemophilus influenzae type b (Hib) vaccine.** / Consult your health care provider. Ages 40 to 64 years  Blood pressure check.** / Every 1 to 2 years.  Lipid and cholesterol check.** / Every 5 years beginning at age 20 years.  Lung cancer screening. / Every year if you are aged 55-80 years and have a 30-pack-year history of smoking and currently smoke or have quit within the past 15 years. Yearly screening is stopped once you have quit smoking for at least 15 years or develop a health problem that would prevent you from having lung cancer treatment.  Clinical breast exam.** / Every year after age 40 years.  BRCA-related cancer risk assessment.** / For women who have family members with a BRCA-related cancer (breast, ovarian, tubal, or peritoneal cancers).  Mammogram.** / Every year beginning at age 40 years and continuing for as long as you are in good health. Consult with your health care provider.  Pap test.** / Every 3 years starting at age 30 years through age 65 or 70 years with a history of 3 consecutive normal Pap tests.  HPV screening.** / Every 3 years from ages 30 years through ages 65 to 70 years with a history of 3 consecutive normal Pap tests.  Fecal occult blood test (FOBT) of stool. / Every year beginning at age 50 years and continuing until age 75 years. You may not need to do this test if you get a colonoscopy every 10 years.  Flexible sigmoidoscopy or colonoscopy.** / Every 5 years for a flexible sigmoidoscopy or every 10 years for a colonoscopy beginning at age 50 years and continuing until age 75 years.  Hepatitis C blood test.** / For all people born from 1945 through  1965 and any individual with known risks for hepatitis C.  Skin self-exam. / Monthly.  Influenza vaccine. / Every year.  Tetanus, diphtheria, and acellular pertussis (Tdap/Td) vaccine.** / Consult your health care provider. Pregnant women should receive 1 dose of Tdap vaccine during each pregnancy. 1 dose of Td every 10 years.  Varicella vaccine.** / Consult your health care provider. Pregnant females who do not have evidence of immunity should receive the first dose after pregnancy.  Zoster vaccine.** / 1 dose for adults aged 60 years or older.  Measles, mumps, rubella (MMR) vaccine.** / You need at least 1 dose of MMR if you were born in 1957 or later. You may also need a 2nd dose. For females of childbearing age, rubella immunity should be determined. If there is no evidence of immunity, females who are not pregnant should be vaccinated. If there is no evidence of immunity, females who are pregnant should delay immunization until after pregnancy.  Pneumococcal 13-valent conjugate (PCV13) vaccine.** / Consult your health care provider.  Pneumococcal polysaccharide (PPSV23) vaccine.** / 1 to 2 doses if you smoke cigarettes or if you have certain conditions.  Meningococcal vaccine.** / Consult your health care provider.  Hepatitis A vaccine.** / Consult your health care provider.  Hepatitis B vaccine.** / Consult your health care provider.  Haemophilus influenzae type b (Hib) vaccine.** / Consult your health care provider. Ages 65   years and over  Blood pressure check.** / Every 1 to 2 years.  Lipid and cholesterol check.** / Every 5 years beginning at age 22 years.  Lung cancer screening. / Every year if you are aged 73-80 years and have a 30-pack-year history of smoking and currently smoke or have quit within the past 15 years. Yearly screening is stopped once you have quit smoking for at least 15 years or develop a health problem that would prevent you from having lung cancer  treatment.  Clinical breast exam.** / Every year after age 4 years.  BRCA-related cancer risk assessment.** / For women who have family members with a BRCA-related cancer (breast, ovarian, tubal, or peritoneal cancers).  Mammogram.** / Every year beginning at age 40 years and continuing for as long as you are in good health. Consult with your health care provider.  Pap test.** / Every 3 years starting at age 9 years through age 34 or 91 years with 3 consecutive normal Pap tests. Testing can be stopped between 65 and 70 years with 3 consecutive normal Pap tests and no abnormal Pap or HPV tests in the past 10 years.  HPV screening.** / Every 3 years from ages 57 years through ages 64 or 45 years with a history of 3 consecutive normal Pap tests. Testing can be stopped between 65 and 70 years with 3 consecutive normal Pap tests and no abnormal Pap or HPV tests in the past 10 years.  Fecal occult blood test (FOBT) of stool. / Every year beginning at age 15 years and continuing until age 17 years. You may not need to do this test if you get a colonoscopy every 10 years.  Flexible sigmoidoscopy or colonoscopy.** / Every 5 years for a flexible sigmoidoscopy or every 10 years for a colonoscopy beginning at age 86 years and continuing until age 71 years.  Hepatitis C blood test.** / For all people born from [birth year] through 1963 and any individual with known risks for hepatitis C.  Osteoporosis screening.** / A one-time screening for women ages 83 years and over and women at risk for fractures or osteoporosis.  Skin self-exam. / Monthly.  Influenza vaccine. / Every year.  Tetanus, diphtheria, and acellular pertussis (Tdap/Td) vaccine.** / 1 dose of Td every 10 years.  Varicella vaccine.** / Consult your health care provider.  Zoster vaccine.** / 1 dose for adults aged 61 years or older.  Pneumococcal 13-valent conjugate (PCV13) vaccine.** / Consult your health care provider.  Pneumococcal  polysaccharide (PPSV23) vaccine.** / 1 dose for all adults aged 28 years and older.  Meningococcal vaccine.** / Consult your health care provider.  Hepatitis A vaccine.** / Consult your health care provider.  Hepatitis B vaccine.** / Consult your health care provider.  Haemophilus influenzae type b (Hib) vaccine.** / Consult your health care provider. ** Family history and personal history of risk and conditions may change your health care provider's recommendations. Document Released: 11/14/2001 Document Revised: 02/02/2014 Document Reviewed: 02/13/2011 [facility] Patient Information 2015 [place], [place]. This information is not intended to replace advice given to you by your health care provider. Make sure you discuss any questions you have with your health care provider.

## 2014-10-27 LAB — URINALYSIS, ROUTINE W REFLEX MICROSCOPIC
Bilirubin Urine: NEGATIVE
Ketones, ur: NEGATIVE
NITRITE: NEGATIVE
Specific Gravity, Urine: 1.02 (ref 1.000–1.030)
Total Protein, Urine: NEGATIVE
UROBILINOGEN UA: 0.2 (ref 0.0–1.0)
Urine Glucose: NEGATIVE
pH: 6 (ref 5.0–8.0)

## 2014-10-28 LAB — CYTOLOGY - PAP

## 2014-10-30 ENCOUNTER — Telehealth: Payer: Self-pay | Admitting: Family Medicine

## 2014-10-30 NOTE — Telephone Encounter (Signed)
Caller name:Kleen, Blimi Relation to BU:LAGT Call back [place]:  Reason for call: pt would like for you to give her a call, regarding her lab results states to call after 12:30 today if possible.

## 2014-10-30 NOTE — Telephone Encounter (Signed)
Patient has an abnormal UA and she is having some burning and some pink on the toilet paper after she urinates. Could you please order a Urine culture?     KP

## 2014-11-02 ENCOUNTER — Other Ambulatory Visit: Payer: Medicare HMO

## 2014-11-03 ENCOUNTER — Other Ambulatory Visit: Payer: Medicare HMO

## 2014-11-04 LAB — URINE CULTURE
Colony Count: NO GROWTH
ORGANISM ID, BACTERIA: NO GROWTH

## 2014-11-17 NOTE — Telephone Encounter (Signed)
Done

## 2015-02-08 ENCOUNTER — Ambulatory Visit (INDEPENDENT_AMBULATORY_CARE_PROVIDER_SITE_OTHER): Payer: Medicare HMO | Admitting: Adult Health

## 2015-02-08 ENCOUNTER — Encounter: Payer: Self-pay | Admitting: Adult Health

## 2015-02-08 VITALS — BP 110/80 | Temp 98.5°F | Ht 65.0 in | Wt 164.3 lb

## 2015-02-08 DIAGNOSIS — M79662 Pain in left lower leg: Secondary | ICD-10-CM

## 2015-02-08 NOTE — Progress Notes (Signed)
Pre visit review using our clinic review tool, if applicable. No additional management support is needed unless otherwise documented below in the visit note. 

## 2015-02-08 NOTE — Patient Instructions (Signed)
I will let you know what the results of the D-Dimer are. The order for the ultra sound has been placed.If you have worsening leg pain please go to the [facility] to have the ultrasound done. Please also let me know of any changes.

## 2015-02-08 NOTE — Progress Notes (Signed)
Subjective:    Patient ID: Terri Mendoza, female    DOB: [birth year], 69 y.o.   MRN: [id number]  HPI Patient presents to the office today for left calf pain that she first noticed this morning when she woke up. Pain feels as though someone is "pushing on it with a finger nail". Has a family history of blood clots, she has never had one. No recent travel or long car rides.   Pain is mostly in mid calf, with some pain in medial aspect of calf. Has not taken anything for the pain. Does take a daily 81 mg ASA  She has not noticed any redness or warmth to left calf   Review of Systems  Cardiovascular: Negative for chest pain, palpitations and leg swelling.  Musculoskeletal: Negative for joint swelling.  All other systems reviewed and are negative.  Past Medical History  Diagnosis Date  . Heart murmur   . Allergy   . Migraines   . Osteopenia   . Atypical chest pain 08/31/2014    persistent CP for 7 days, negative stress echo with EF 65% on 09/01/2014  . Pulmonary nodule 09/02/2014    17mm LUL pulmonary nodule seen on CT    History   Social History  . Marital Status: Married    Spouse Name: N/A  . Number of Children: N/A  . Years of Education: N/A   Occupational History  . Not on file.   Social History Main Topics  . Smoking status: Never Smoker   . Smokeless tobacco: Never Used  . Alcohol Use: 2.4 oz/week    4 Glasses of wine per week  . Drug Use: No  . Sexual Activity:    Partners: Male   Other Topics Concern  . Not on file   Social History Narrative    Past Surgical History  Procedure Laterality Date  . Colonoscopy      Family History  Problem Relation Age of Onset  . Heart failure      CHF  . Coronary artery disease    . Heart disease Mother 60    chf,  valve repair  . Cancer Mother 85    colon  . Colon cancer Mother 87  . Heart disease Father 35    chf  . CAD Father   . Factor V Leiden deficiency Daughter     Allergies  Allergen Reactions   . Iohexol Hives and Swelling    Current Outpatient Prescriptions on File Prior to Visit  Medication Sig Dispense Refill  . Ascorbic Acid (VITAMIN C) 1000 MG tablet Take 1,000 mg by mouth daily.     Marland Kitchen aspirin EC 81 MG EC tablet Take 1 tablet (81 mg total) by mouth daily.    . diphenhydramine-acetaminophen (TYLENOL PM) 25-500 MG TABS Take 1 tablet by mouth at bedtime as needed.    Marland Kitchen EPIPEN 2-PAK 0.3 MG/0.3ML SOAJ injection INJECT 0.3 MLS INTO THE MUSCLE ONCE. 2 mL 0  . fish oil-omega-3 fatty acids 1000 MG capsule Take 1 g by mouth daily.     Marland Kitchen ibuprofen (ADVIL,MOTRIN) 200 MG tablet Take 600 mg by mouth every 6 (six) hours as needed. For pain.    . Misc Natural Products (OSTEO BI-FLEX ADV TRIPLE ST) TABS Take 1 tablet by mouth daily.      No current facility-administered medications on file prior to visit.    BP 110/80 mmHg  Temp(Src) 98.5 F (36.9 C) (Oral)  Ht 5\' 5"  (1.651 m)  Wt 164 lb 4.8 oz (74.526 kg)  BMI 27.34 kg/m2       Objective:   Physical Exam  Constitutional: She is oriented to person, place, and time. She appears well-developed and well-nourished. No distress.  Cardiovascular: Normal rate, regular rhythm, normal heart sounds and intact distal pulses.  Exam reveals no gallop and no friction rub.   No murmur heard. Pulmonary/Chest: Effort normal and breath sounds normal. No respiratory distress. She has no wheezes. She has no rales. She exhibits no tenderness.  Musculoskeletal: Normal range of motion. She exhibits edema (Right calf 36cm and left calf 37 cm) and tenderness (medial aspect of left calf with palpation).  No redness or warmth noted  Neurological: She is alert and oriented to person, place, and time.  Skin: Skin is warm and dry. No rash noted. She is not diaphoretic. No erythema. No pallor.  Psychiatric: She has a normal mood and affect. Her behavior is normal. Judgment and thought content normal.  Vitals reviewed.      Assessment & Plan:  1. Calf  pain, left - Wells score of 1 for localized tenderness to calf. - She does not have any SOB, CP or Tachycardia - D-dimer, Quantitative - US Venous Img Lower Unilateral Left; Future - if needed - Will inform patient of D-Dimer results and next steps if necessary - Follow up in the ER with any SOB, CP, tachycardia or increased calf pain.

## 2015-02-09 ENCOUNTER — Telehealth: Payer: Self-pay | Admitting: Adult Health

## 2015-02-09 LAB — D-DIMER, QUANTITATIVE: D-Dimer, Quant: 1.18 ug/mL-FEU — ABNORMAL HIGH (ref 0.00–0.48)

## 2015-02-09 NOTE — Telephone Encounter (Signed)
Called and informed patient of positive D Dimer. It was stressed that this is not always indicative of a DVT but that she should go and get the ultrasound of her lower extremity done.

## 2015-02-10 ENCOUNTER — Encounter: Payer: Self-pay | Admitting: Adult Health

## 2015-02-10 ENCOUNTER — Ambulatory Visit
Admission: RE | Admit: 2015-02-10 | Discharge: 2015-02-10 | Disposition: A | Discharge status: Home / Self Care | Payer: Medicare HMO | Source: Ambulatory Visit | Attending: Adult Health | Admitting: Adult Health

## 2015-02-10 DIAGNOSIS — M79662 Pain in left lower leg: Secondary | ICD-10-CM

## 2015-02-23 ENCOUNTER — Encounter (HOSPITAL_COMMUNITY): Payer: Medicare HMO

## 2015-03-29 ENCOUNTER — Inpatient Hospital Stay: Admission: RE | Admit: 2015-03-29 | Payer: Medicare HMO | Source: Ambulatory Visit

## 2015-04-13 ENCOUNTER — Ambulatory Visit (INDEPENDENT_AMBULATORY_CARE_PROVIDER_SITE_OTHER)
Admission: RE | Admit: 2015-04-13 | Discharge: 2015-04-13 | Disposition: A | Discharge status: Home / Self Care | Payer: Medicare HMO | Source: Ambulatory Visit | Attending: Pulmonary Disease | Admitting: Pulmonary Disease

## 2015-04-13 DIAGNOSIS — R911 Solitary pulmonary nodule: Secondary | ICD-10-CM | POA: Diagnosis not present

## 2015-04-14 NOTE — Progress Notes (Signed)
Quick Note:  Called and spoke to pt. Reviewed results and recs. Pt is scheduled for OV with TP on 8/8 per RA. Pt voiced understanding and had no further questions. ______

## 2015-04-19 ENCOUNTER — Ambulatory Visit (INDEPENDENT_AMBULATORY_CARE_PROVIDER_SITE_OTHER): Payer: Medicare HMO | Admitting: Family Medicine

## 2015-04-19 ENCOUNTER — Encounter: Payer: Self-pay | Admitting: Family Medicine

## 2015-04-19 VITALS — BP 100/62 | HR 89 | Temp 98.0°F | Resp 18 | Ht 66.0 in | Wt 164.2 lb

## 2015-04-19 DIAGNOSIS — J302 Other seasonal allergic rhinitis: Secondary | ICD-10-CM

## 2015-04-19 DIAGNOSIS — H6591 Unspecified nonsuppurative otitis media, right ear: Secondary | ICD-10-CM

## 2015-04-19 MED ORDER — ALL DAY ALLERGY 10 MG PO TABS: 10.0000 mg | ORAL_TABLET | Freq: Every day | ORAL | Status: DC

## 2015-04-19 MED ORDER — CEFUROXIME AXETIL 500 MG PO TABS: 500.0000 mg | ORAL_TABLET | Freq: Two times a day (BID) | ORAL | Status: AC

## 2015-04-19 MED ORDER — FLUTICASONE PROPIONATE 50 MCG/ACT NA SUSP: 2.0000 | Freq: Every day | NASAL | Status: DC

## 2015-04-19 NOTE — Progress Notes (Signed)
Pre visit review using our clinic review tool, if applicable. No additional management support is needed unless otherwise documented below in the visit note. 

## 2015-04-19 NOTE — Progress Notes (Signed)
Patient ID: Terri Mendoza, female    DOB: 05/10/1946  Age: 69 y.o. MRN: [id number]    Subjective:  Subjective HPI MCKYNLIE VANDERSLICE presents for R ear pain and sore throat, no fever x 1 week.  She tried advil cold and sinus with little relief.  + pnd, no nasal congestion.    Review of Systems  Constitutional: Positive for chills. Negative for fever.  HENT: Positive for congestion, postnasal drip, rhinorrhea, sinus pressure and sore throat.   Respiratory: Positive for cough. Negative for chest tightness, shortness of breath and wheezing.   Cardiovascular: Negative for chest pain, palpitations and leg swelling.  Allergic/Immunologic: Negative for environmental allergies.  Psychiatric/Behavioral: Negative for dysphoric mood and decreased concentration. The patient is not nervous/anxious.     History Past Medical History  Diagnosis Date  . Heart murmur   . Allergy   . Migraines   . Osteopenia   . Atypical chest pain 08/31/2014    persistent CP for 7 days, negative stress echo with EF 65% on 09/01/2014  . Pulmonary nodule 09/02/2014    72mm LUL pulmonary nodule seen on CT    She has past surgical history that includes Colonoscopy.   Her family history includes CAD in her father; Cancer (age of onset: 80) in her mother; Colon cancer (age of onset: 103) in her mother; Coronary artery disease in an other family member; Factor V Leiden deficiency in her daughter; Heart disease (age of onset: 32) in her father; Heart disease (age of onset: 32) in her mother; Heart failure in an other family member.She reports that she has never smoked. She has never used smokeless tobacco. She reports that she drinks about 2.4 oz of alcohol per week. She reports that she does not use illicit drugs.  Current Outpatient Prescriptions on File Prior to Visit  Medication Sig Dispense Refill  . Ascorbic Acid (VITAMIN C) 1000 MG tablet Take 1,000 mg by mouth daily.     . diphenhydramine-acetaminophen (TYLENOL  PM) 25-500 MG TABS Take 1 tablet by mouth at bedtime as needed.    Marland Kitchen EPIPEN 2-PAK 0.3 MG/0.3ML SOAJ injection INJECT 0.3 MLS INTO THE MUSCLE ONCE. 2 mL 0  . fish oil-omega-3 fatty acids 1000 MG capsule Take 1 g by mouth daily.     Marland Kitchen ibuprofen (ADVIL,MOTRIN) 200 MG tablet Take 600 mg by mouth every 6 (six) hours as needed. For pain.    . Misc Natural Products (OSTEO BI-FLEX ADV TRIPLE ST) TABS Take 1 tablet by mouth daily.     Marland Kitchen aspirin EC 81 MG EC tablet Take 1 tablet (81 mg total) by mouth daily. (Patient not taking: Reported on 04/19/2015)     No current facility-administered medications on file prior to visit.     Objective:  Objective Physical Exam  Constitutional: She is oriented to person, place, and time. She appears well-developed and well-nourished.  HENT:  Right Ear: Ear canal normal. There is tenderness. Tympanic membrane is erythematous. A middle ear effusion is present.  Left Ear: Hearing, tympanic membrane, external ear and ear canal normal.  + PND + errythema  Eyes: Conjunctivae are normal. Right eye exhibits no discharge. Left eye exhibits no discharge.  Cardiovascular: Normal rate, regular rhythm and normal heart sounds.   No murmur heard. Pulmonary/Chest: Effort normal and breath sounds normal. No respiratory distress. She has no wheezes. She has no rales. She exhibits no tenderness.  Musculoskeletal: She exhibits no edema.  Lymphadenopathy:    She has cervical adenopathy.  Neurological: She is alert and oriented to person, place, and time.   BP 100/62 mmHg  Pulse 89  Temp(Src) 98 F (36.7 C) (Oral)  Resp 18  Ht 5\' 6"  (1.676 m)  Wt 164 lb 3.2 oz (74.481 kg)  BMI 26.52 kg/m2  SpO2 96% Wt Readings from Last 3 Encounters:  04/19/15 164 lb 3.2 oz (74.481 kg)  02/08/15 164 lb 4.8 oz (74.526 kg)  10/26/14 164 lb 12.8 oz (74.753 kg)     Lab Results  Component Value Date   WBC 4.5 09/02/2014   HGB 14.5 09/02/2014   HCT 44.4 09/02/2014   PLT 248 09/02/2014    GLUCOSE 106* 09/01/2014   CHOL 169 09/01/2014   TRIG 97 09/01/2014   HDL 40 09/01/2014   LDLCALC 110* 09/01/2014   ALT 17 01/02/2013   AST 22 01/02/2013   NA 139 09/01/2014   K 4.2 09/01/2014   CL 100 09/01/2014   CREATININE 0.59 09/01/2014   BUN 18 09/01/2014   CO2 26 09/01/2014   TSH 1.51 01/02/2013   INR 1.11 09/01/2014   HGBA1C 5.9* 08/31/2014    Ct Chest Wo Contrast  04/13/2015   CLINICAL DATA:  Followup lung nodules  EXAM: CT CHEST WITHOUT CONTRAST  TECHNIQUE: Multidetector CT imaging of the chest was performed following the standard protocol without IV contrast.  COMPARISON:  CT angio chest of 09/02/2014  FINDINGS: The single noncalcified 4 mm nodule within the subpleural left upper lobe is completely stable in size. No additional pulmonary nodule is seen. No focal infiltrate is noted and there is no evidence of pleural effusion. The central airway is patent. On bone window images no abnormality is seen  On soft tissue window images, the thyroid gland is unremarkable. On this unenhanced study, no mediastinal or hilar adenopathy is seen. The heart is borderline enlarged. Images of the upper abdomen show no significant abnormality other than probable hemorrhagic left upper pole renal cyst.  IMPRESSION: Stable 4 mm noncalcified nodule in the left upper lobe. No new or enlarging lung nodule is seen.   Electronically Signed   By: Ivar Drape M.D.   On: 04/13/2015 16:39     Assessment & Plan:  Plan I am having Ms. Start start on cefUROXime and fluticasone. I am also having her maintain her fish oil-omega-3 fatty acids, OSTEO BI-FLEX ADV TRIPLE ST, vitamin C, ibuprofen, EPIPEN 2-PAK, aspirin, and diphenhydramine-acetaminophen.  Meds ordered this encounter  Medications  . cefUROXime (CEFTIN) 500 MG tablet    Sig: Take 1 tablet (500 mg total) by mouth 2 (two) times daily.    Dispense:  20 tablet    Refill:  0  . fluticasone (FLONASE) 50 MCG/ACT nasal spray    Sig: Place 2 sprays  into both nostrils daily.    Dispense:  16 g    Refill:  6    Problem List Items Addressed This Visit    None    Visit Diagnoses    Right otitis media with effusion    -  Primary    Relevant Medications    cefUROXime (CEFTIN) 500 MG tablet    fluticasone (FLONASE) 50 MCG/ACT nasal spray       Follow-up: Return if symptoms worsen or fail to improve.  [name], DO

## 2015-04-19 NOTE — Patient Instructions (Signed)

## 2015-05-10 ENCOUNTER — Encounter: Payer: Self-pay | Admitting: Adult Health

## 2015-05-10 ENCOUNTER — Ambulatory Visit (INDEPENDENT_AMBULATORY_CARE_PROVIDER_SITE_OTHER): Payer: Medicare HMO | Admitting: Adult Health

## 2015-05-10 VITALS — BP 110/60 | HR 72 | Temp 97.5°F | Ht 65.0 in | Wt 164.0 lb

## 2015-05-10 DIAGNOSIS — R911 Solitary pulmonary nodule: Secondary | ICD-10-CM

## 2015-05-10 NOTE — Progress Notes (Signed)
Subjective:    Patient ID: Terri Mendoza, female    DOB: [birth year], 69 y.o.   MRN: [id number]  HPI  69 year old female with no significant medical history presented to [facility] ED 08/31/14 c/o chest tightness and DOE. Cardiac workup negative. D-dimer was elevated, but CTA was negative for PE. She was admitted, found to have elevated D -Dimer   TEST :   Cardiac workup negative. D-dimer was elevated, but CTA was negative for PE. She was admitted, found to have elevated D -Dimer  VQ scan was ordered which has low probability for PE.   Stress echo on 09/01/2014 which showed EF 65%, no obvious regional wall motion abnormality at baseline. With exertion, EF 75%, continue to be without any regional wall motion abnormalities.  CTA of the chest W/ pretrement for allergy die showed probable thrombosis of the right internal jugular vein and also 4 mm subpleural nodule seen in the left upper lobe, Right jugular vein ultrasound was obtained which was negative for any significant DVT. ANA and RA factor neg. ESR was elevated ~70   05/10/2015 Follow up : Dyspnea and Lung nodule  Pt returns for 8 month follow up .  She says she is doing well. Has had no return of her dyspnea.  Repeat CT chest on 04/13/15 with stable 4 mm nodule in LUL We discussed CT results .  She says she is back to normal activities .  She denies fever , chest pain, orthopnea, or edema.    Review of Systems Constitutional:   No  weight loss, night sweats,  Fevers, chills, fatigue, or  lassitude.  HEENT:   No headaches,  Difficulty swallowing,  Tooth/dental problems, or  Sore throat,                No sneezing, itching, ear ache, nasal congestion, post nasal drip,   CV:  No chest pain,  Orthopnea, PND, swelling in lower extremities, anasarca, dizziness, palpitations, syncope.   GI  No heartburn, indigestion, abdominal pain, nausea, vomiting, diarrhea, change in bowel habits, loss of appetite, bloody stools.   Resp: No shortness  of breath with exertion or at rest.  No excess mucus, no productive cough,  No non-productive cough,  No coughing up of blood.  No change in color of mucus.  No wheezing.  No chest wall deformity  Skin: no rash or lesions.  GU: no dysuria, change in color of urine, no urgency or frequency.  No flank pain, no hematuria   MS:  No joint pain or swelling.  No decreased range of motion.  No back pain.  Psych:  No change in mood or affect. No depression or anxiety.  No memory loss.         Objective:   Physical Exam GEN: A/Ox3; pleasant , NAD, well nourished   HEENT:  [place]/AT,  EACs-clear, TMs-wnl, NOSE-clear, THROAT-clear, no lesions, no postnasal drip or exudate noted.   NECK:  Supple w/ fair ROM; no JVD; normal carotid impulses w/o bruits; no thyromegaly or nodules palpated; no lymphadenopathy.  RESP  Clear  P & A; w/o, wheezes/ rales/ or rhonchi.no accessory muscle use, no dullness to percussion  CARD:  RRR, no m/r/g  , no peripheral edema, pulses intact, no cyanosis or clubbing.  GI:   Soft & nt; nml bowel sounds; no organomegaly or masses detected.  Musco: Warm bil, no deformities or joint swelling noted.   Neuro: alert, no focal deficits noted.    Skin:  Warm, no lesions or rashes   CT chest 04/13/15  Stable 4 mm noncalcified nodule in the left upper lobe. No new or enlarging lung nodule is seen.      Assessment & Plan:

## 2015-05-10 NOTE — Patient Instructions (Signed)
Continue on current regimen  Follow up with Dr. Elsworth Soho  In 1 year with CT chest to follow lung nodule

## 2015-05-11 NOTE — Assessment & Plan Note (Signed)
No change in lung nodule on CT  Plan to repeat CT in 1 year   Plan  Continue on current regimen  Follow up with Dr. Elsworth Soho  In 1 year with CT chest to follow lung nodule

## 2015-05-14 NOTE — Progress Notes (Signed)
Reviewed & agree with plan  

## 2015-08-26 DIAGNOSIS — H608X2 Other otitis externa, left ear: Secondary | ICD-10-CM | POA: Diagnosis not present

## 2015-10-18 ENCOUNTER — Telehealth: Payer: Self-pay | Admitting: Family Medicine

## 2015-10-27 NOTE — Telephone Encounter (Signed)
flu

## 2015-11-01 DIAGNOSIS — R69 Illness, unspecified: Secondary | ICD-10-CM | POA: Diagnosis not present

## 2015-11-05 DIAGNOSIS — L814 Other melanin hyperpigmentation: Secondary | ICD-10-CM | POA: Diagnosis not present

## 2015-11-05 DIAGNOSIS — L738 Other specified follicular disorders: Secondary | ICD-10-CM | POA: Diagnosis not present

## 2015-11-05 DIAGNOSIS — L218 Other seborrheic dermatitis: Secondary | ICD-10-CM | POA: Diagnosis not present

## 2015-11-05 DIAGNOSIS — L821 Other seborrheic keratosis: Secondary | ICD-10-CM | POA: Diagnosis not present

## 2015-11-05 DIAGNOSIS — L570 Actinic keratosis: Secondary | ICD-10-CM | POA: Diagnosis not present

## 2015-12-15 ENCOUNTER — Telehealth: Payer: Self-pay | Admitting: Family Medicine

## 2015-12-15 NOTE — Telephone Encounter (Signed)
lvm inquiring if patient received flu shot  °

## 2016-01-31 ENCOUNTER — Ambulatory Visit (INDEPENDENT_AMBULATORY_CARE_PROVIDER_SITE_OTHER): Payer: Medicare HMO | Admitting: Family Medicine

## 2016-01-31 ENCOUNTER — Encounter: Payer: Self-pay | Admitting: Family Medicine

## 2016-01-31 VITALS — BP 110/74 | HR 66 | Temp 98.7°F | Ht 65.0 in | Wt 169.2 lb

## 2016-01-31 DIAGNOSIS — Z91030 Bee allergy status: Secondary | ICD-10-CM

## 2016-01-31 DIAGNOSIS — Z0000 Encounter for general adult medical examination without abnormal findings: Secondary | ICD-10-CM

## 2016-01-31 DIAGNOSIS — Z23 Encounter for immunization: Secondary | ICD-10-CM

## 2016-01-31 DIAGNOSIS — Z1159 Encounter for screening for other viral diseases: Secondary | ICD-10-CM

## 2016-01-31 DIAGNOSIS — Z1231 Encounter for screening mammogram for malignant neoplasm of breast: Secondary | ICD-10-CM

## 2016-01-31 DIAGNOSIS — R911 Solitary pulmonary nodule: Secondary | ICD-10-CM

## 2016-01-31 DIAGNOSIS — J302 Other seasonal allergic rhinitis: Secondary | ICD-10-CM

## 2016-01-31 DIAGNOSIS — H6591 Unspecified nonsuppurative otitis media, right ear: Secondary | ICD-10-CM

## 2016-01-31 DIAGNOSIS — Z91038 Other insect allergy status: Secondary | ICD-10-CM | POA: Diagnosis not present

## 2016-01-31 MED ORDER — EPINEPHRINE 0.3 MG/0.3ML IJ SOAJ: INTRAMUSCULAR | Status: DC

## 2016-01-31 MED ORDER — FLUTICASONE PROPIONATE 50 MCG/ACT NA SUSP: 2.0000 | Freq: Every day | NASAL | Status: DC

## 2016-01-31 NOTE — Progress Notes (Signed)
Pre visit review using our clinic review tool, if applicable. No additional management support is needed unless otherwise documented below in the visit note. 

## 2016-01-31 NOTE — Progress Notes (Signed)
Subjective:   Terri Mendoza is a 70 y.o. female who presents for Medicare Annual (Subsequent) preventive examination.  Review of Systems:   Review of Systems  Constitutional: Negative for activity change, appetite change and fatigue.  HENT: Negative for hearing loss, congestion, tinnitus and ear discharge.   Eyes: Negative for visual disturbance (see optho q1y -- vision corrected to 20/20 with glasses).  Respiratory: Negative for cough, chest tightness and shortness of breath.   Cardiovascular: Negative for chest pain, palpitations and leg swelling.  Gastrointestinal: Negative for abdominal pain, diarrhea, constipation and abdominal distention.  Genitourinary: Negative for urgency, frequency, decreased urine volume and difficulty urinating.  Musculoskeletal: Negative for back pain, arthralgias and gait problem.  Skin: Negative for color change, pallor and rash.  Neurological: Negative for dizziness, light-headedness, numbness and headaches.  Hematological: Negative for adenopathy. Does not bruise/bleed easily.  Psychiatric/Behavioral: Negative for suicidal ideas, confusion, sleep disturbance, self-injury, dysphoric mood, decreased concentration and agitation.  Pt is able to read and write and can do all ADLs No risk for falling No abuse/ violence in home           Objective:     Vitals: BP 110/74 mmHg  Pulse 66  Temp(Src) 98.7 F (37.1 C) (Oral)  Ht 5\' 5"  (1.651 m)  Wt 169 lb 3.2 oz (76.749 kg)  BMI 28.16 kg/m2  SpO2 98%  Body mass index is 28.16 kg/(m^2).  BP 110/74 mmHg  Pulse 66  Temp(Src) 98.7 F (37.1 C) (Oral)  Ht 5\' 5"  (1.651 m)  Wt 169 lb 3.2 oz (76.749 kg)  BMI 28.16 kg/m2  SpO2 98% General appearance: alert, cooperative, appears stated age and no distress Head: Normocephalic, without obvious abnormality, atraumatic Eyes: negative findings: lids and lashes normal, conjunctivae and sclerae normal and pupils equal, round, reactive to light and  accomodation Ears:  Nose: Nares normal. Septum midline. Mucosa normal. No drainage or sinus tenderness. Throat: lips, mucosa, and tongue normal; teeth and gums normal Neck: no adenopathy, no carotid bruit, no JVD, supple, symmetrical, trachea midline and thyroid not enlarged, symmetric, no tenderness/mass/nodules Back: symmetric, no curvature. ROM normal. No CVA tenderness. Lungs: clear to auscultation bilaterally Breasts: normal appearance, no masses or tenderness Heart: regular rate and rhythm, S1, S2 normal, no murmur, click, rub or gallop Abdomen: soft, non-tender; bowel sounds normal; no masses,  no organomegaly Pelvic: deferred Extremities: extremities normal, atraumatic, no cyanosis or edema Pulses: 2+ and symmetric Skin: Skin color, texture, turgor normal. No rashes or lesions Lymph nodes: Cervical, supraclavicular, and axillary nodes normal. Neurologic: Alert and oriented X 3, normal strength and tone. Normal symmetric reflexes. Normal coordination and gait  Tobacco History  Smoking status  . Never Smoker   Smokeless tobacco  . Never Used     Counseling given: Not Answered   Past Medical History  Diagnosis Date  . Heart murmur   . Allergy   . Migraines   . Osteopenia   . Atypical chest pain 08/31/2014    persistent CP for 7 days, negative stress echo with EF 65% on 09/01/2014  . Pulmonary nodule 09/02/2014    4mm LUL pulmonary nodule seen on CT   Past Surgical History  Procedure Laterality Date  . Colonoscopy     Family History  Problem Relation Age of Onset  . Heart failure      CHF  . Coronary artery disease    . Heart disease Mother 75    chf,  valve repair  . Cancer  Mother 2    colon  . Colon cancer Mother 35  . Heart disease Father 37    chf  . CAD Father   . Factor V Leiden deficiency Daughter    History  Sexual Activity  . Sexual Activity:  . Partners: Male    Outpatient Encounter Prescriptions as of 01/31/2016  Medication Sig  . Ascorbic  Acid (VITAMIN C) 1000 MG tablet Take 1,000 mg by mouth daily.   . diphenhydramine-acetaminophen (TYLENOL PM) 25-500 MG TABS Take 0.5 tablets by mouth at bedtime as needed.   . fexofenadine (ALLEGRA) 180 MG tablet Take 180 mg by mouth daily.  . fish oil-omega-3 fatty acids 1000 MG capsule Take 1 g by mouth daily.   . fluticasone (FLONASE) 50 MCG/ACT nasal spray Place 2 sprays into both nostrils daily.  Marland Kitchen ibuprofen (ADVIL,MOTRIN) 200 MG tablet Take 600 mg by mouth every 6 (six) hours as needed. For pain.  . Misc Natural Products (OSTEO BI-FLEX ADV TRIPLE ST) TABS Take 1 tablet by mouth daily.   . naproxen sodium (ANAPROX) 220 MG tablet Take 220 mg by mouth 2 (two) times daily with a meal.  . [DISCONTINUED] EPINEPHrine (EPIPEN 2-PAK) 0.3 mg/0.3 mL IJ SOAJ injection INJECT 0.3 MLS INTO THE MUSCLE ONCE.  . [DISCONTINUED] EPIPEN 2-PAK 0.3 MG/0.3ML SOAJ injection INJECT 0.3 MLS INTO THE MUSCLE ONCE.  . [DISCONTINUED] fluticasone (FLONASE) 50 MCG/ACT nasal spray Place 2 sprays into both nostrils daily.  . [DISCONTINUED] ALL DAY ALLERGY 10 MG tablet Take 1 tablet (10 mg total) by mouth daily.  . [DISCONTINUED] aspirin EC 81 MG EC tablet Take 1 tablet (81 mg total) by mouth daily.   No facility-administered encounter medications on file as of 01/31/2016.    Activities of Daily Living In your present state of health, do you have any difficulty performing the following activities: 01/31/2016 01/31/2016  Hearing? N Y  Vision? N N  Difficulty concentrating or making decisions? N N  Walking or climbing stairs? N N  Dressing or bathing? N N  Doing errands, shopping? N N    Patient Care Team: [name], DO as PCP - General Rigoberto Noel, MD as Consulting Physician (Pulmonary Disease) Harriett Sine, MD as Consulting Physician (Dermatology)    Assessment:    cpe Exercise Activities and Dietary recommendations Current Exercise Habits: Structured exercise class, Type of exercise: Other - see  comments (pickle ball, tennis, walking water aerobics , yard work), Time (Minutes): > 60, Frequency (Times/Week): 7, Weekly Exercise (Minutes/Week): 0, Intensity: Intense, Exercise limited by: None identified  Goals    None     Fall Risk Fall Risk  01/31/2016 01/31/2016 08/31/2014 12/31/2012  Falls in the past year? No No No No   Depression Screen PHQ 2/9 Scores 01/31/2016 01/31/2016 08/31/2014 12/31/2012  PHQ - 2 Score 0 0 0 0     Cognitive Testing mmse 30/30  Immunization History  Administered Date(s) Administered  . Influenza,inj,Quad PF,36+ Mos 07/02/2014  . Pneumococcal Conjugate-13 10/26/2014  . Pneumococcal Polysaccharide-23 01/31/2016  . Td 01/17/2006  . Zoster 08/07/2011   Screening Tests Health Maintenance  Topic Date Due  . Hepatitis C Screening  02-Apr-1946  . MAMMOGRAM  08/12/2015  . TETANUS/TDAP  01/18/2016  . INFLUENZA VACCINE  05/02/2016  . COLONOSCOPY  05/01/2023  . DEXA SCAN  Completed  . ZOSTAVAX  Completed  . PNA vac Low Risk Adult  Completed      Plan:    ghm utd Check labs See  AVS During the course of the visit the patient was educated and counseled about the following appropriate screening and preventive services:   Vaccines to include Pneumoccal, Influenza, Hepatitis B, Td, Zostavax, HCV  Electrocardiogram  Cardiovascular Disease  Colorectal cancer screening  Bone density screening  Diabetes screening  Glaucoma screening  Mammography/PAP  Nutrition counseling   Patient Instructions (the written plan) was given to the patient.  1. Right otitis media with effusion flonase and antihistamine rto prn  2. Encounter for screening mammogram for breast cancer - MM DIGITAL SCREENING BILATERAL; Future  3. Seasonal allergic rhinitis - fluticasone (FLONASE) 50 MCG/ACT nasal spray; Place 2 sprays into both nostrils daily.  Dispense: 16 g; Refill: 6 - Lipid panel; Future - CBC with Differential/Platelet; Future - CMP14+CBC/D/Plt+TSH; Future -  Hepatitis C antibody; Future - Urinalysis, Routine w reflex microscopic; Future  4. Bee sting allergy   5. Need for hepatitis C screening test  - Hepatitis C antibody; Future - Urinalysis, Routine w reflex microscopic; Future  6. Solitary pulmonary nodule  - Lipid panel; Future - CBC with Differential/Platelet; Future - CMP14+CBC/D/Plt+TSH; Future - Hepatitis C antibody; Future - Urinalysis, Routine w reflex microscopic; Future  7. Routine history and physical examination of adult   8. Need for Streptococcus pneumoniae vaccination  - Pneumococcal polysaccharide vaccine 23-valent greater than or equal to 2yo subcutaneous/IM  [name], DO  02/06/2016

## 2016-01-31 NOTE — Patient Instructions (Addendum)
Get some Debrox ear drops to use in the R ear to soften the wax ---- if no improvement in a week we can irrigate the ear if the wax is still there   [facility] call [facility] and schedule your Mammogram Address: [address], [place], [place] [address]  Phone: [phone]                          Preventive Care for Adults, Female A healthy lifestyle and preventive care can promote health and wellness. Preventive health guidelines for women include the following key practices.  A routine yearly physical is a good way to check with your health care provider about your health and preventive screening. It is a chance to share any concerns and updates on your health and to receive a thorough exam.  Visit your dentist for a routine exam and preventive care every 6 months. Brush your teeth twice a day and floss once a day. Good oral hygiene prevents tooth decay and gum disease.  The frequency of eye exams is based on your age, health, family medical history, use of contact lenses, and other factors. Follow your health care provider's recommendations for frequency of eye exams.  Eat a healthy diet. Foods like vegetables, fruits, whole grains, low-fat dairy products, and lean protein foods contain the nutrients you need without too many calories. Decrease your intake of foods high in solid fats, added sugars, and salt. Eat the right amount of calories for you.Get information about a proper diet from your health care provider, if necessary.  Regular physical exercise is one of the most important things you can do for your health. Most adults should get at least 150 minutes of moderate-intensity exercise (any activity that increases your heart rate and causes you to sweat) each week. In addition, most adults need muscle-strengthening exercises on 2 or more days a week.  Maintain a healthy weight. The body mass index (BMI) is a screening tool to identify possible weight problems. It provides an  estimate of body fat based on height and weight. Your health care provider can find your BMI and can help you achieve or maintain a healthy weight.For adults 20 years and older:  A BMI below 18.5 is considered underweight.  A BMI of 18.5 to 24.9 is normal.  A BMI of 25 to 29.9 is considered overweight.  A BMI of 30 and above is considered obese.  Maintain normal blood lipids and cholesterol levels by exercising and minimizing your intake of saturated fat. Eat a balanced diet with plenty of fruit and vegetables. Blood tests for lipids and cholesterol should begin at age 37 and be repeated every 5 years. If your lipid or cholesterol levels are high, you are over 50, or you are at high risk for heart disease, you may need your cholesterol levels checked more frequently.Ongoing high lipid and cholesterol levels should be treated with medicines if diet and exercise are not working.  If you smoke, find out from your health care provider how to quit. If you do not use tobacco, do not start.  Lung cancer screening is recommended for adults aged 50-80 years who are at high risk for developing lung cancer because of a history of smoking. A yearly low-dose CT scan of the lungs is recommended for people who have at least a 30-pack-year history of smoking and are a current smoker or have quit within the past 15 years. A pack  year of smoking is smoking an average of 1 pack of cigarettes a day for 1 year (for example: 1 pack a day for 30 years or 2 packs a day for 15 years). Yearly screening should continue until the smoker has stopped smoking for at least 15 years. Yearly screening should be stopped for people who develop a health problem that would prevent them from having lung cancer treatment.  If you are pregnant, do not drink alcohol. If you are breastfeeding, be very cautious about drinking alcohol. If you are not pregnant and choose to drink alcohol, do not have more than 1 drink per day. One drink is  considered to be 12 ounces (355 mL) of beer, 5 ounces (148 mL) of wine, or 1.5 ounces (44 mL) of liquor.  Avoid use of street drugs. Do not share needles with anyone. Ask for help if you need support or instructions about stopping the use of drugs.  High blood pressure causes heart disease and increases the risk of stroke. Your blood pressure should be checked at least every 1 to 2 years. Ongoing high blood pressure should be treated with medicines if weight loss and exercise do not work.  If you are 70-76 years old, ask your health care provider if you should take aspirin to prevent strokes.  Diabetes screening is done by taking a blood sample to check your blood glucose level after you have not eaten for a certain period of time (fasting). If you are not overweight and you do not have risk factors for diabetes, you should be screened once every 3 years starting at age 8. If you are overweight or obese and you are 74-52 years of age, you should be screened for diabetes every year as part of your cardiovascular risk assessment.  Breast cancer screening is essential preventive care for women. You should practice "breast self-awareness." This means understanding the normal appearance and feel of your breasts and may include breast self-examination. Any changes detected, no matter how small, should be reported to a health care provider. Women in their 56s and 30s should have a clinical breast exam (CBE) by a health care provider as part of a regular health exam every 1 to 3 years. After age 37, women should have a CBE every year. Starting at age 51, women should consider having a mammogram (breast X-ray test) every year. Women who have a family history of breast cancer should talk to their health care provider about genetic screening. Women at a high risk of breast cancer should talk to their health care providers about having an MRI and a mammogram every year.  Breast cancer gene (BRCA)-related cancer  risk assessment is recommended for women who have family members with BRCA-related cancers. BRCA-related cancers include breast, ovarian, tubal, and peritoneal cancers. Having family members with these cancers may be associated with an increased risk for harmful changes (mutations) in the breast cancer genes BRCA1 and BRCA2. Results of the assessment will determine the need for genetic counseling and BRCA1 and BRCA2 testing.  Your health care provider may recommend that you be screened regularly for cancer of the pelvic organs (ovaries, uterus, and vagina). This screening involves a pelvic examination, including checking for microscopic changes to the surface of your cervix (Pap test). You may be encouraged to have this screening done every 3 years, beginning at age 66.  For women ages 11-65, health care providers may recommend pelvic exams and Pap testing every 3 years, or they may recommend the  Pap and pelvic exam, combined with testing for human papilloma virus (HPV), every 5 years. Some types of HPV increase your risk of cervical cancer. Testing for HPV may also be done on women of any age with unclear Pap test results.  Other health care providers may not recommend any screening for nonpregnant women who are considered low risk for pelvic cancer and who do not have symptoms. Ask your health care provider if a screening pelvic exam is right for you.  If you have had past treatment for cervical cancer or a condition that could lead to cancer, you need Pap tests and screening for cancer for at least 20 years after your treatment. If Pap tests have been discontinued, your risk factors (such as having a new sexual partner) need to be reassessed to determine if screening should resume. Some women have medical problems that increase the chance of getting cervical cancer. In these cases, your health care provider may recommend more frequent screening and Pap tests.  Colorectal cancer can be detected and often  prevented. Most routine colorectal cancer screening begins at the age of 23 years and continues through age 2 years. However, your health care provider may recommend screening at an earlier age if you have risk factors for colon cancer. On a yearly basis, your health care provider may provide home test kits to check for hidden blood in the stool. Use of a small camera at the end of a tube, to directly examine the colon (sigmoidoscopy or colonoscopy), can detect the earliest forms of colorectal cancer. Talk to your health care provider about this at age 65, when routine screening begins. Direct exam of the colon should be repeated every 5-10 years through age 38 years, unless early forms of precancerous polyps or small growths are found.  People who are at an increased risk for hepatitis B should be screened for this virus. You are considered at high risk for hepatitis B if:  You were born in a country where hepatitis B occurs often. Talk with your health care provider about which countries are considered high risk.  Your parents were born in a high-risk country and you have not received a shot to protect against hepatitis B (hepatitis B vaccine).  You have HIV or AIDS.  You use needles to inject street drugs.  You live with, or have sex with, someone who has hepatitis B.  You get hemodialysis treatment.  You take certain medicines for conditions like cancer, organ transplantation, and autoimmune conditions.  Hepatitis C blood testing is recommended for all people born from [birth year] through 1965 and any individual with known risks for hepatitis C.  Practice safe sex. Use condoms and avoid high-risk sexual practices to reduce the spread of sexually transmitted infections (STIs). STIs include gonorrhea, chlamydia, syphilis, trichomonas, herpes, HPV, and human immunodeficiency virus (HIV). Herpes, HIV, and HPV are viral illnesses that have no cure. They can result in disability, cancer, and  death.  You should be screened for sexually transmitted illnesses (STIs) including gonorrhea and chlamydia if:  You are sexually active and are younger than 24 years.  You are older than 24 years and your health care provider tells you that you are at risk for this type of infection.  Your sexual activity has changed since you were last screened and you are at an increased risk for chlamydia or gonorrhea. Ask your health care provider if you are at risk.  If you are at risk of being infected with HIV,  it is recommended that you take a prescription medicine daily to prevent HIV infection. This is called preexposure prophylaxis (PrEP). You are considered at risk if:  You are sexually active and do not regularly use condoms or know the HIV status of your partner(s).  You take drugs by injection.  You are sexually active with a partner who has HIV.  Talk with your health care provider about whether you are at high risk of being infected with HIV. If you choose to begin PrEP, you should first be tested for HIV. You should then be tested every 3 months for as long as you are taking PrEP.  Osteoporosis is a disease in which the bones lose minerals and strength with aging. This can result in serious bone fractures or breaks. The risk of osteoporosis can be identified using a bone density scan. Women ages 39 years and over and women at risk for fractures or osteoporosis should discuss screening with their health care providers. Ask your health care provider whether you should take a calcium supplement or vitamin D to reduce the rate of osteoporosis.  Menopause can be associated with physical symptoms and risks. Hormone replacement therapy is available to decrease symptoms and risks. You should talk to your health care provider about whether hormone replacement therapy is right for you.  Use sunscreen. Apply sunscreen liberally and repeatedly throughout the day. You should seek shade when your shadow  is shorter than you. Protect yourself by wearing long sleeves, pants, a wide-brimmed hat, and sunglasses year round, whenever you are outdoors.  Once a month, do a whole body skin exam, using a mirror to look at the skin on your back. Tell your health care provider of new moles, moles that have irregular borders, moles that are larger than a pencil eraser, or moles that have changed in shape or color.  Stay current with required vaccines (immunizations).  Influenza vaccine. All adults should be immunized every year.  Tetanus, diphtheria, and acellular pertussis (Td, Tdap) vaccine. Pregnant women should receive 1 dose of Tdap vaccine during each pregnancy. The dose should be obtained regardless of the length of time since the last dose. Immunization is preferred during the 27th-36th week of gestation. An adult who has not previously received Tdap or who does not know her vaccine status should receive 1 dose of Tdap. This initial dose should be followed by tetanus and diphtheria toxoids (Td) booster doses every 10 years. Adults with an unknown or incomplete history of completing a 3-dose immunization series with Td-containing vaccines should begin or complete a primary immunization series including a Tdap dose. Adults should receive a Td booster every 10 years.  Varicella vaccine. An adult without evidence of immunity to varicella should receive 2 doses or a second dose if she has previously received 1 dose. Pregnant females who do not have evidence of immunity should receive the first dose after pregnancy. This first dose should be obtained before leaving the health care facility. The second dose should be obtained 4-8 weeks after the first dose.  Human papillomavirus (HPV) vaccine. Females aged 13-26 years who have not received the vaccine previously should obtain the 3-dose series. The vaccine is not recommended for use in pregnant females. However, pregnancy testing is not needed before receiving a  dose. If a female is found to be pregnant after receiving a dose, no treatment is needed. In that case, the remaining doses should be delayed until after the pregnancy. Immunization is recommended for any person with  an immunocompromised condition through the age of 43 years if she did not get any or all doses earlier. During the 3-dose series, the second dose should be obtained 4-8 weeks after the first dose. The third dose should be obtained 24 weeks after the first dose and 16 weeks after the second dose.  Zoster vaccine. One dose is recommended for adults aged 87 years or older unless certain conditions are present.  Measles, mumps, and rubella (MMR) vaccine. Adults born before [birth year] generally are considered immune to measles and mumps. Adults born in [birth year] or later should have 1 or more doses of MMR vaccine unless there is a contraindication to the vaccine or there is laboratory evidence of immunity to each of the three diseases. A routine second dose of MMR vaccine should be obtained at least 28 days after the first dose for students attending postsecondary schools, health care workers, or international travelers. People who received inactivated measles vaccine or an unknown type of measles vaccine during 1963-1967 should receive 2 doses of MMR vaccine. People who received inactivated mumps vaccine or an unknown type of mumps vaccine before 1979 and are at high risk for mumps infection should consider immunization with 2 doses of MMR vaccine. For females of childbearing age, rubella immunity should be determined. If there is no evidence of immunity, females who are not pregnant should be vaccinated. If there is no evidence of immunity, females who are pregnant should delay immunization until after pregnancy. Unvaccinated health care workers born before [birth year] who lack laboratory evidence of measles, mumps, or rubella immunity or laboratory confirmation of disease should consider measles and mumps immunization  with 2 doses of MMR vaccine or rubella immunization with 1 dose of MMR vaccine.  Pneumococcal 13-valent conjugate (PCV13) vaccine. When indicated, a person who is uncertain of his immunization history and has no record of immunization should receive the PCV13 vaccine. All adults 39 years of age and older should receive this vaccine. An adult aged 82 years or older who has certain medical conditions and has not been previously immunized should receive 1 dose of PCV13 vaccine. This PCV13 should be followed with a dose of pneumococcal polysaccharide (PPSV23) vaccine. Adults who are at high risk for pneumococcal disease should obtain the PPSV23 vaccine at least 8 weeks after the dose of PCV13 vaccine. Adults older than 68 years of age who have normal immune system function should obtain the PPSV23 vaccine dose at least 1 year after the dose of PCV13 vaccine.  Pneumococcal polysaccharide (PPSV23) vaccine. When PCV13 is also indicated, PCV13 should be obtained first. All adults aged 19 years and older should be immunized. An adult younger than age 73 years who has certain medical conditions should be immunized. Any person who resides in a nursing home or long-term care facility should be immunized. An adult smoker should be immunized. People with an immunocompromised condition and certain other conditions should receive both PCV13 and PPSV23 vaccines. People with human immunodeficiency virus (HIV) infection should be immunized as soon as possible after diagnosis. Immunization during chemotherapy or radiation therapy should be avoided. Routine use of PPSV23 vaccine is not recommended for American Indians, [place] Natives, or people younger than 65 years unless there are medical conditions that require PPSV23 vaccine. When indicated, people who have unknown immunization and have no record of immunization should receive PPSV23 vaccine. One-time revaccination 5 years after the first dose of PPSV23 is recommended for people  aged 19-64 years who have chronic kidney failure, nephrotic syndrome,  asplenia, or immunocompromised conditions. People who received 1-2 doses of PPSV23 before age 59 years should receive another dose of PPSV23 vaccine at age 28 years or later if at least 5 years have passed since the previous dose. Doses of PPSV23 are not needed for people immunized with PPSV23 at or after age 84 years.  Meningococcal vaccine. Adults with asplenia or persistent complement component deficiencies should receive 2 doses of quadrivalent meningococcal conjugate (MenACWY-D) vaccine. The doses should be obtained at least 2 months apart. Microbiologists working with certain meningococcal bacteria, [employer] recruits, people at risk during an outbreak, and people who travel to or live in countries with a high rate of meningitis should be immunized. A first-year college student up through age 65 years who is living in a residence hall should receive a dose if she did not receive a dose on or after her 16th birthday. Adults who have certain high-risk conditions should receive one or more doses of vaccine.  Hepatitis A vaccine. Adults who wish to be protected from this disease, have certain high-risk conditions, work with hepatitis A-infected animals, work in hepatitis A research labs, or travel to or work in countries with a high rate of hepatitis A should be immunized. Adults who were previously unvaccinated and who anticipate close contact with an international adoptee during the first 60 days after arrival in the [place] from a country with a high rate of hepatitis A should be immunized.  Hepatitis B vaccine. Adults who wish to be protected from this disease, have certain high-risk conditions, may be exposed to blood or other infectious body fluids, are household contacts or sex partners of hepatitis B positive people, are clients or workers in certain care facilities, or travel to or work in countries with a high rate of  hepatitis B should be immunized.  Haemophilus influenzae type b (Hib) vaccine. A previously unvaccinated person with asplenia or sickle cell disease or having a scheduled splenectomy should receive 1 dose of Hib vaccine. Regardless of previous immunization, a recipient of a hematopoietic stem cell transplant should receive a 3-dose series 6-12 months after her successful transplant. Hib vaccine is not recommended for adults with HIV infection. Preventive Services / Frequency Ages 66 to 23 years  Blood pressure check.** / Every 3-5 years.  Lipid and cholesterol check.** / Every 5 years beginning at age 40.  Clinical breast exam.** / Every 3 years for women in their 36s and 64s.  BRCA-related cancer risk assessment.** / For women who have family members with a BRCA-related cancer (breast, ovarian, tubal, or peritoneal cancers).  Pap test.** / Every 2 years from ages 30 through 31. Every 3 years starting at age 47 through age 45 or 47 with a history of 3 consecutive normal Pap tests.  HPV screening.** / Every 3 years from ages 19 through ages 64 to 35 with a history of 3 consecutive normal Pap tests.  Hepatitis C blood test.** / For any individual with known risks for hepatitis C.  Skin self-exam. / Monthly.  Influenza vaccine. / Every year.  Tetanus, diphtheria, and acellular pertussis (Tdap, Td) vaccine.** / Consult your health care provider. Pregnant women should receive 1 dose of Tdap vaccine during each pregnancy. 1 dose of Td every 10 years.  Varicella vaccine.** / Consult your health care provider. Pregnant females who do not have evidence of immunity should receive the first dose after pregnancy.  HPV vaccine. / 3 doses over 6 months, if 85 and younger. The vaccine is  not recommended for use in pregnant females. However, pregnancy testing is not needed before receiving a dose.  Measles, mumps, rubella (MMR) vaccine.** / You need at least 1 dose of MMR if you were born in 1957 or  later. You may also need a 2nd dose. For females of childbearing age, rubella immunity should be determined. If there is no evidence of immunity, females who are not pregnant should be vaccinated. If there is no evidence of immunity, females who are pregnant should delay immunization until after pregnancy.  Pneumococcal 13-valent conjugate (PCV13) vaccine.** / Consult your health care provider.  Pneumococcal polysaccharide (PPSV23) vaccine.** / 1 to 2 doses if you smoke cigarettes or if you have certain conditions.  Meningococcal vaccine.** / 1 dose if you are age 27 to 53 years and a Market researcher living in a residence hall, or have one of several medical conditions, you need to get vaccinated against meningococcal disease. You may also need additional booster doses.  Hepatitis A vaccine.** / Consult your health care provider.  Hepatitis B vaccine.** / Consult your health care provider.  Haemophilus influenzae type b (Hib) vaccine.** / Consult your health care provider. Ages 74 to 60 years  Blood pressure check.** / Every year.  Lipid and cholesterol check.** / Every 5 years beginning at age 75 years.  Lung cancer screening. / Every year if you are aged 69-80 years and have a 30-pack-year history of smoking and currently smoke or have quit within the past 15 years. Yearly screening is stopped once you have quit smoking for at least 15 years or develop a health problem that would prevent you from having lung cancer treatment.  Clinical breast exam.** / Every year after age 62 years.  BRCA-related cancer risk assessment.** / For women who have family members with a BRCA-related cancer (breast, ovarian, tubal, or peritoneal cancers).  Mammogram.** / Every year beginning at age 61 years and continuing for as long as you are in good health. Consult with your health care provider.  Pap test.** / Every 3 years starting at age 34 years through age 42 or 52 years with a history of 3  consecutive normal Pap tests.  HPV screening.** / Every 3 years from ages 68 years through ages 45 to 56 years with a history of 3 consecutive normal Pap tests.  Fecal occult blood test (FOBT) of stool. / Every year beginning at age 50 years and continuing until age 41 years. You may not need to do this test if you get a colonoscopy every 10 years.  Flexible sigmoidoscopy or colonoscopy.** / Every 5 years for a flexible sigmoidoscopy or every 10 years for a colonoscopy beginning at age 30 years and continuing until age 50 years.  Hepatitis C blood test.** / For all people born from [birth year] through 1963 and any individual with known risks for hepatitis C.  Skin self-exam. / Monthly.  Influenza vaccine. / Every year.  Tetanus, diphtheria, and acellular pertussis (Tdap/Td) vaccine.** / Consult your health care provider. Pregnant women should receive 1 dose of Tdap vaccine during each pregnancy. 1 dose of Td every 10 years.  Varicella vaccine.** / Consult your health care provider. Pregnant females who do not have evidence of immunity should receive the first dose after pregnancy.  Zoster vaccine.** / 1 dose for adults aged 8 years or older.  Measles, mumps, rubella (MMR) vaccine.** / You need at least 1 dose of MMR if you were born in 1957 or later. You may also  need a second dose. For females of childbearing age, rubella immunity should be determined. If there is no evidence of immunity, females who are not pregnant should be vaccinated. If there is no evidence of immunity, females who are pregnant should delay immunization until after pregnancy.  Pneumococcal 13-valent conjugate (PCV13) vaccine.** / Consult your health care provider.  Pneumococcal polysaccharide (PPSV23) vaccine.** / 1 to 2 doses if you smoke cigarettes or if you have certain conditions.  Meningococcal vaccine.** / Consult your health care provider.  Hepatitis A vaccine.** / Consult your health care provider.  Hepatitis B  vaccine.** / Consult your health care provider.  Haemophilus influenzae type b (Hib) vaccine.** / Consult your health care provider. Ages 72 years and over  Blood pressure check.** / Every year.  Lipid and cholesterol check.** / Every 5 years beginning at age 33 years.  Lung cancer screening. / Every year if you are aged 20-80 years and have a 30-pack-year history of smoking and currently smoke or have quit within the past 15 years. Yearly screening is stopped once you have quit smoking for at least 15 years or develop a health problem that would prevent you from having lung cancer treatment.  Clinical breast exam.** / Every year after age 64 years.  BRCA-related cancer risk assessment.** / For women who have family members with a BRCA-related cancer (breast, ovarian, tubal, or peritoneal cancers).  Mammogram.** / Every year beginning at age 23 years and continuing for as long as you are in good health. Consult with your health care provider.  Pap test.** / Every 3 years starting at age 54 years through age 10 or 51 years with 3 consecutive normal Pap tests. Testing can be stopped between 65 and 70 years with 3 consecutive normal Pap tests and no abnormal Pap or HPV tests in the past 10 years.  HPV screening.** / Every 3 years from ages 24 years through ages 56 or 1 years with a history of 3 consecutive normal Pap tests. Testing can be stopped between 65 and 70 years with 3 consecutive normal Pap tests and no abnormal Pap or HPV tests in the past 10 years.  Fecal occult blood test (FOBT) of stool. / Every year beginning at age 55 years and continuing until age 67 years. You may not need to do this test if you get a colonoscopy every 10 years.  Flexible sigmoidoscopy or colonoscopy.** / Every 5 years for a flexible sigmoidoscopy or every 10 years for a colonoscopy beginning at age 33 years and continuing until age 52 years.  Hepatitis C blood test.** / For all people born from [birth year] through  1965 and any individual with known risks for hepatitis C.  Osteoporosis screening.** / A one-time screening for women ages 53 years and over and women at risk for fractures or osteoporosis.  Skin self-exam. / Monthly.  Influenza vaccine. / Every year.  Tetanus, diphtheria, and acellular pertussis (Tdap/Td) vaccine.** / 1 dose of Td every 10 years.  Varicella vaccine.** / Consult your health care provider.  Zoster vaccine.** / 1 dose for adults aged 44 years or older.  Pneumococcal 13-valent conjugate (PCV13) vaccine.** / Consult your health care provider.  Pneumococcal polysaccharide (PPSV23) vaccine.** / 1 dose for all adults aged 66 years and older.  Meningococcal vaccine.** / Consult your health care provider.  Hepatitis A vaccine.** / Consult your health care provider.  Hepatitis B vaccine.** / Consult your health care provider.  Haemophilus influenzae type b (Hib) vaccine.** / Consult  your health care provider. ** Family history and personal history of risk and conditions may change your health care provider's recommendations.   This information is not intended to replace advice given to you by your health care provider. Make sure you discuss any questions you have with your health care provider.   Document Released: 11/14/2001 Document Revised: 10/09/2014 Document Reviewed: 02/13/2011 Elsevier Interactive Patient Education [employer].

## 2016-02-02 ENCOUNTER — Telehealth: Payer: Self-pay | Admitting: Family Medicine

## 2016-02-02 NOTE — Telephone Encounter (Signed)
[facility] Day - Client TELEPHONE ADVICE RECORD [facility] Medical Call [facility] Patient Name: Terri Mendoza DOB: 05/11/1946 Initial Comment Caller states had pneumonia shot on Monday, thinks she is having reaction. Arm swollen and red Nurse Assessment Nurse: Martyn Ehrich, RN, Felicia Date/Time (Eastern Time): 02/02/2016 3:07:36 PM Confirm and document reason for call. If symptomatic, describe symptoms. You must click the next button to save text entered. ---Pt had a pneumonia shot in L arm Mon. and it is swollen and very red - and red patch is about 3" x 8 ". No fever Has the patient traveled out of the country within the last 30 days? ---No Does the patient have any new or worsening symptoms? ---Yes Will a triage be completed? ---Yes Related visit to physician within the last 2 weeks? ---Yes Does the PT have any chronic conditions? (i.e. diabetes, asthma, etc.) ---Yes List chronic conditions. ---bad season allergies - not worse than when seenIs this a behavioral health or substance abuse call? ---No Guidelines Guideline Title Affirmed Question Affirmed Notes Immunization Reactions Injection site reaction to any vaccine (all triage questions negative) Final Disposition [facility] [place], RN, Solmon Ice Comments there are some white patches in the red area (Swelling a little further out than red area) - redness getting worse since this am in the 3" x 8" area there are normal skin areas also - scattered blotches - moderately swollen a little further out than the redness. Told her we are just now at the 48 hour mark. One may have increasing redness with in 48 h Told her if she gets worse after the 48 hour mark call us back - also call back if it is not improving by 3 d from injection No widespread rash she had this vaccine last year without issues Moderate swelling on upper arm where the shot is and diagonally down - not all around Disagree/Comply: Comply Call Id:  [phone]

## 2016-02-02 NOTE — Telephone Encounter (Signed)
[facility] Day - Client TELEPHONE ADVICE RECORD [facility] Medical Call Center Patient Name: Terri Mendoza DOB: May 02, 1946 Initial Comment Caller states had pneumonia shot on Monday, thinks she is having reaction. Arm swollen and red Nurse Assessment Nurse: Martyn Ehrich, RN, Felicia Date/Time (Eastern Time): 02/02/2016 3:07:36 PM Confirm and document reason for call. If symptomatic, describe symptoms. You must click the next button to save text entered. ---Pt had a pneumonia shot in L arm Mon. and it is swollen and very red - and red patch is about 3" x 8 ". No fever Has the patient traveled out of the country within the last 30 days? ---No Does the patient have any new or worsening symptoms? ---Yes Will a triage be completed? ---Yes Related visit to physician within the last 2 weeks? ---Sharyne Richters the PT have any chronic conditions? (i.e. diabetes, asthma, etc.) ---Yes List chronic conditions. ---bad season allergies - not worse than when seen Is this a behavioral health or substance abuse call? ---No Guidelines Guideline Title Affirmed Question Affirmed Notes Immunization Reactions Injection site reaction to any vaccine (all triage questions negative) Final Disposition [facility] [place], RN, Solmon Ice Comments there are some white patches in the red area (Swelling a little further out than red area) - redness getting worse since this am in the 3" x 8" area there are normal skin areas also - scattered blotches - moderately swollen a little further out than the redness. Told her we are just now at the 48 hour mark. One may have increasing redness with in 48 h Told her if she gets worse after the 48 hour mark call us back - also call back if it is not improving by 3 d from injection No widespread rashshe had this vaccine last year without issues Moderate swelling on upper arm where the shot is and diagonally down - not all around for a 2nd time tried to reach pt  again - this time suggested she call this nurse back at the office tried again to reach her but she did not answer THIS IS DUPLICATE RECORD - ONLY NEW INFO IS MD'S INPUT ADDED MD Marion - pt does not need to be seen Dr. Scarlette Calico was on call Call Id: [id number] Disagree/Comply: Comply

## 2016-02-03 ENCOUNTER — Encounter: Payer: Self-pay | Admitting: Family Medicine

## 2016-02-03 NOTE — Telephone Encounter (Signed)
Please advise      KP 

## 2016-02-03 NOTE — Telephone Encounter (Signed)
Ok to send to pharmacy

## 2016-02-03 NOTE — Telephone Encounter (Signed)
Called to follow up with patient.  Pt states redness and swelling in arm has improved.  Says that arm is still sore, but that she can manage.  She has taking a benadryl and it has been therapeutic.  No need follow up needed.    Pt did request while on the phone for a Rx for [place].  States she's allergic to bee stings and would like to have an epipen that she can carry around with her.  Per patient, the Adrenaclick will only cost her $10.  She would like Rx to be sent to [facility] on [address].    Please advise.

## 2016-02-04 MED ORDER — EPINEPHRINE 0.3 MG/0.3ML IJ SOAJ: 0.3000 mg | Freq: Once | INTRAMUSCULAR | Status: DC

## 2016-02-04 NOTE — Telephone Encounter (Signed)
Rx sent to the pharmacy.

## 2016-02-06 ENCOUNTER — Encounter: Payer: Self-pay | Admitting: Family Medicine

## 2016-02-07 ENCOUNTER — Other Ambulatory Visit: Payer: Self-pay

## 2016-02-07 DIAGNOSIS — Z1231 Encounter for screening mammogram for malignant neoplasm of breast: Secondary | ICD-10-CM

## 2016-02-23 DIAGNOSIS — H5203 Hypermetropia, bilateral: Secondary | ICD-10-CM | POA: Diagnosis not present

## 2016-02-23 DIAGNOSIS — Z0100 Encounter for examination of eyes and vision without abnormal findings: Secondary | ICD-10-CM | POA: Diagnosis not present

## 2016-02-23 DIAGNOSIS — H40019 Open angle with borderline findings, low risk, unspecified eye: Secondary | ICD-10-CM | POA: Diagnosis not present

## 2016-03-14 ENCOUNTER — Ambulatory Visit
Admission: RE | Admit: 2016-03-14 | Discharge: 2016-03-14 | Disposition: A | Discharge status: Home / Self Care | Payer: Medicare HMO | Source: Ambulatory Visit

## 2016-03-14 DIAGNOSIS — Z1231 Encounter for screening mammogram for malignant neoplasm of breast: Secondary | ICD-10-CM | POA: Diagnosis not present

## 2016-03-22 ENCOUNTER — Other Ambulatory Visit (INDEPENDENT_AMBULATORY_CARE_PROVIDER_SITE_OTHER): Payer: Medicare HMO

## 2016-03-22 DIAGNOSIS — Z1159 Encounter for screening for other viral diseases: Secondary | ICD-10-CM

## 2016-03-22 DIAGNOSIS — J309 Allergic rhinitis, unspecified: Secondary | ICD-10-CM | POA: Diagnosis not present

## 2016-03-22 DIAGNOSIS — J302 Other seasonal allergic rhinitis: Secondary | ICD-10-CM

## 2016-03-22 DIAGNOSIS — R911 Solitary pulmonary nodule: Secondary | ICD-10-CM | POA: Diagnosis not present

## 2016-03-22 DIAGNOSIS — E038 Other specified hypothyroidism: Secondary | ICD-10-CM

## 2016-03-22 LAB — CBC WITH DIFFERENTIAL/PLATELET
BASOS PCT: 0.4 % (ref 0.0–3.0)
Basophils Absolute: 0 10*3/uL (ref 0.0–0.1)
EOS PCT: 1.9 % (ref 0.0–5.0)
Eosinophils Absolute: 0.1 10*3/uL (ref 0.0–0.7)
HCT: 43.8 % (ref 36.0–46.0)
Hemoglobin: 14.7 g/dL (ref 12.0–15.0)
LYMPHS ABS: 1.4 10*3/uL (ref 0.7–4.0)
Lymphocytes Relative: 34.1 % (ref 12.0–46.0)
MCHC: 33.6 g/dL (ref 30.0–36.0)
MCV: 91.7 fl (ref 78.0–100.0)
MONO ABS: 0.4 10*3/uL (ref 0.1–1.0)
Monocytes Relative: 9.9 % (ref 3.0–12.0)
NEUTROS ABS: 2.2 10*3/uL (ref 1.4–7.7)
NEUTROS PCT: 53.7 % (ref 43.0–77.0)
Platelets: 192 10*3/uL (ref 150.0–400.0)
RBC: 4.78 Mil/uL (ref 3.87–5.11)
RDW: 14 % (ref 11.5–15.5)
WBC: 4.1 10*3/uL (ref 4.0–10.5)

## 2016-03-22 LAB — COMPREHENSIVE METABOLIC PANEL
ALK PHOS: 55 U/L (ref 39–117)
ALT: 13 U/L (ref 0–35)
AST: 19 U/L (ref 0–37)
Albumin: 4.3 g/dL (ref 3.5–5.2)
BILIRUBIN TOTAL: 0.5 mg/dL (ref 0.2–1.2)
BUN: 15 mg/dL (ref 6–23)
CHLORIDE: 106 meq/L (ref 96–112)
CO2: 27 mEq/L (ref 19–32)
Calcium: 9.4 mg/dL (ref 8.4–10.5)
Creatinine, Ser: 0.61 mg/dL (ref 0.40–1.20)
GFR: 103.75 mL/min (ref >60.00)
GLUCOSE: 91 mg/dL (ref 70–99)
POTASSIUM: 4 meq/L (ref 3.5–5.1)
Sodium: 142 mEq/L (ref 135–145)
Total Protein: 7 g/dL (ref 6.0–8.3)

## 2016-03-22 LAB — LIPID PANEL
CHOLESTEROL: 193 mg/dL (ref 0–200)
HDL: 63.6 mg/dL (ref >39.00)
LDL Cholesterol: 117 mg/dL — ABNORMAL HIGH (ref 0–99)
NonHDL: 129.62
TRIGLYCERIDES: 63 mg/dL (ref 0.0–149.0)
Total CHOL/HDL Ratio: 3
VLDL: 12.6 mg/dL (ref 0.0–40.0)

## 2016-03-22 LAB — TSH: TSH: 2.08 u[IU]/mL (ref 0.35–4.50)

## 2016-03-23 LAB — HEPATITIS C ANTIBODY: HCV AB: NEGATIVE

## 2016-04-20 ENCOUNTER — Ambulatory Visit (INDEPENDENT_AMBULATORY_CARE_PROVIDER_SITE_OTHER)
Admission: RE | Admit: 2016-04-20 | Discharge: 2016-04-20 | Disposition: A | Discharge status: Home / Self Care | Payer: Medicare HMO | Source: Ambulatory Visit | Attending: Adult Health | Admitting: Adult Health

## 2016-04-20 DIAGNOSIS — R911 Solitary pulmonary nodule: Secondary | ICD-10-CM | POA: Diagnosis not present

## 2016-05-01 NOTE — Progress Notes (Signed)
Called spoke with pt. Reviewed results and recs. Pt voiced understanding and had no further questions.

## 2016-05-09 DIAGNOSIS — R69 Illness, unspecified: Secondary | ICD-10-CM | POA: Diagnosis not present

## 2016-06-20 ENCOUNTER — Ambulatory Visit (INDEPENDENT_AMBULATORY_CARE_PROVIDER_SITE_OTHER): Payer: Medicare HMO | Admitting: Pulmonary Disease

## 2016-06-20 ENCOUNTER — Encounter: Payer: Self-pay | Admitting: Pulmonary Disease

## 2016-06-20 DIAGNOSIS — R911 Solitary pulmonary nodule: Secondary | ICD-10-CM

## 2016-06-20 NOTE — Progress Notes (Signed)
   Subjective:    Patient ID: Terri Mendoza, female    DOB: December 17, 1945, 70 y.o.   MRN: [id number]  HPI  70 year old female  For FU of lung nodule  Chief Complaint  Patient presents with  . Follow-up    Discuss CT results.   No new symptoms We reviewed her CT scan that shows calcified granuloma in the left upper lobe -calcification is  more apparent now that it was on previous CTs She is a never smoker Pneumonia vaccination is up-to-date  Significant tests/ events   08/2014 Cardiac workup negative. D-dimer was elevated, but CTA was negative for PE. She was admitted, found to have elevated D -Dimer  VQ scan was ordered which has low probability for PE.   Stress echo on 09/01/2014 which showed EF 65%, no obvious regional wall motion abnormality at baseline. With exertion, EF 75%, continue to be without any regional wall motion abnormalities.  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 ANA and RA factor neg. ESR was elevated ~70     Review of Systems Patient denies significant dyspnea,cough, hemoptysis,  chest pain, palpitations, pedal edema, orthopnea, paroxysmal nocturnal dyspnea, lightheadedness, nausea, vomiting, abdominal or  leg pains      Objective:   Physical Exam  Gen. Pleasant, well-nourished, in no distress ENT - no lesions, no post nasal drip Neck: No JVD, no thyromegaly, no carotid bruits Lungs: no use of accessory muscles, no dullness to percussion, clear without rales or rhonchi  Cardiovascular: Rhythm regular, heart sounds  normal, no murmurs or gallops, no peripheral edema Musculoskeletal: No deformities, no cyanosis or clubbing        Assessment & Plan:

## 2016-06-20 NOTE — Patient Instructions (Signed)
Calcified nodule in her left upper lobe represents old healed infection and does not need further follow-up imaging

## 2016-06-20 NOTE — Assessment & Plan Note (Signed)
Calcified nodule in her left upper lobe represents old healed infection and does not need further follow-up imaging Stable on 2 year follow-up

## 2016-07-11 DIAGNOSIS — H40019 Open angle with borderline findings, low risk, unspecified eye: Secondary | ICD-10-CM | POA: Diagnosis not present

## 2016-10-31 DIAGNOSIS — Z0100 Encounter for examination of eyes and vision without abnormal findings: Secondary | ICD-10-CM | POA: Diagnosis not present

## 2016-11-17 DIAGNOSIS — D225 Melanocytic nevi of trunk: Secondary | ICD-10-CM | POA: Diagnosis not present

## 2016-11-17 DIAGNOSIS — L570 Actinic keratosis: Secondary | ICD-10-CM | POA: Diagnosis not present

## 2016-11-17 DIAGNOSIS — D2272 Melanocytic nevi of left lower limb, including hip: Secondary | ICD-10-CM | POA: Diagnosis not present

## 2016-11-17 DIAGNOSIS — L918 Other hypertrophic disorders of the skin: Secondary | ICD-10-CM | POA: Diagnosis not present

## 2016-11-17 DIAGNOSIS — L821 Other seborrheic keratosis: Secondary | ICD-10-CM | POA: Diagnosis not present

## 2016-11-17 DIAGNOSIS — D2262 Melanocytic nevi of left upper limb, including shoulder: Secondary | ICD-10-CM | POA: Diagnosis not present

## 2016-11-17 DIAGNOSIS — D2261 Melanocytic nevi of right upper limb, including shoulder: Secondary | ICD-10-CM | POA: Diagnosis not present

## 2016-11-17 DIAGNOSIS — L814 Other melanin hyperpigmentation: Secondary | ICD-10-CM | POA: Diagnosis not present

## 2016-11-17 DIAGNOSIS — L218 Other seborrheic dermatitis: Secondary | ICD-10-CM | POA: Diagnosis not present

## 2016-11-27 DIAGNOSIS — R69 Illness, unspecified: Secondary | ICD-10-CM | POA: Diagnosis not present

## 2016-12-18 DIAGNOSIS — M4317 Spondylolisthesis, lumbosacral region: Secondary | ICD-10-CM | POA: Diagnosis not present

## 2016-12-18 DIAGNOSIS — M7072 Other bursitis of hip, left hip: Secondary | ICD-10-CM | POA: Diagnosis not present

## 2016-12-18 DIAGNOSIS — M7071 Other bursitis of hip, right hip: Secondary | ICD-10-CM | POA: Diagnosis not present

## 2016-12-18 DIAGNOSIS — Q72812 Congenital shortening of left lower limb: Secondary | ICD-10-CM | POA: Diagnosis not present

## 2016-12-18 DIAGNOSIS — M9904 Segmental and somatic dysfunction of sacral region: Secondary | ICD-10-CM | POA: Diagnosis not present

## 2016-12-18 DIAGNOSIS — M9903 Segmental and somatic dysfunction of lumbar region: Secondary | ICD-10-CM | POA: Diagnosis not present

## 2016-12-18 DIAGNOSIS — M5387 Other specified dorsopathies, lumbosacral region: Secondary | ICD-10-CM | POA: Diagnosis not present

## 2016-12-18 DIAGNOSIS — M25561 Pain in right knee: Secondary | ICD-10-CM | POA: Diagnosis not present

## 2016-12-18 DIAGNOSIS — M25562 Pain in left knee: Secondary | ICD-10-CM | POA: Diagnosis not present

## 2016-12-18 DIAGNOSIS — M9905 Segmental and somatic dysfunction of pelvic region: Secondary | ICD-10-CM | POA: Diagnosis not present

## 2016-12-19 DIAGNOSIS — M25562 Pain in left knee: Secondary | ICD-10-CM | POA: Diagnosis not present

## 2016-12-19 DIAGNOSIS — M7071 Other bursitis of hip, right hip: Secondary | ICD-10-CM | POA: Diagnosis not present

## 2016-12-19 DIAGNOSIS — M9903 Segmental and somatic dysfunction of lumbar region: Secondary | ICD-10-CM | POA: Diagnosis not present

## 2016-12-19 DIAGNOSIS — M9904 Segmental and somatic dysfunction of sacral region: Secondary | ICD-10-CM | POA: Diagnosis not present

## 2016-12-19 DIAGNOSIS — M9905 Segmental and somatic dysfunction of pelvic region: Secondary | ICD-10-CM | POA: Diagnosis not present

## 2016-12-19 DIAGNOSIS — M7072 Other bursitis of hip, left hip: Secondary | ICD-10-CM | POA: Diagnosis not present

## 2016-12-19 DIAGNOSIS — Q72812 Congenital shortening of left lower limb: Secondary | ICD-10-CM | POA: Diagnosis not present

## 2016-12-19 DIAGNOSIS — M4317 Spondylolisthesis, lumbosacral region: Secondary | ICD-10-CM | POA: Diagnosis not present

## 2016-12-19 DIAGNOSIS — M25561 Pain in right knee: Secondary | ICD-10-CM | POA: Diagnosis not present

## 2016-12-19 DIAGNOSIS — M5387 Other specified dorsopathies, lumbosacral region: Secondary | ICD-10-CM | POA: Diagnosis not present

## 2016-12-21 DIAGNOSIS — M7072 Other bursitis of hip, left hip: Secondary | ICD-10-CM | POA: Diagnosis not present

## 2016-12-21 DIAGNOSIS — M25561 Pain in right knee: Secondary | ICD-10-CM | POA: Diagnosis not present

## 2016-12-21 DIAGNOSIS — M9903 Segmental and somatic dysfunction of lumbar region: Secondary | ICD-10-CM | POA: Diagnosis not present

## 2016-12-21 DIAGNOSIS — M7071 Other bursitis of hip, right hip: Secondary | ICD-10-CM | POA: Diagnosis not present

## 2016-12-21 DIAGNOSIS — M9904 Segmental and somatic dysfunction of sacral region: Secondary | ICD-10-CM | POA: Diagnosis not present

## 2016-12-21 DIAGNOSIS — Q72812 Congenital shortening of left lower limb: Secondary | ICD-10-CM | POA: Diagnosis not present

## 2016-12-21 DIAGNOSIS — M25562 Pain in left knee: Secondary | ICD-10-CM | POA: Diagnosis not present

## 2016-12-21 DIAGNOSIS — M9905 Segmental and somatic dysfunction of pelvic region: Secondary | ICD-10-CM | POA: Diagnosis not present

## 2016-12-21 DIAGNOSIS — M4317 Spondylolisthesis, lumbosacral region: Secondary | ICD-10-CM | POA: Diagnosis not present

## 2016-12-21 DIAGNOSIS — M5387 Other specified dorsopathies, lumbosacral region: Secondary | ICD-10-CM | POA: Diagnosis not present

## 2016-12-25 DIAGNOSIS — M25561 Pain in right knee: Secondary | ICD-10-CM | POA: Diagnosis not present

## 2016-12-25 DIAGNOSIS — M25562 Pain in left knee: Secondary | ICD-10-CM | POA: Diagnosis not present

## 2016-12-25 DIAGNOSIS — M7071 Other bursitis of hip, right hip: Secondary | ICD-10-CM | POA: Diagnosis not present

## 2016-12-25 DIAGNOSIS — M9905 Segmental and somatic dysfunction of pelvic region: Secondary | ICD-10-CM | POA: Diagnosis not present

## 2016-12-25 DIAGNOSIS — M5387 Other specified dorsopathies, lumbosacral region: Secondary | ICD-10-CM | POA: Diagnosis not present

## 2016-12-25 DIAGNOSIS — M7072 Other bursitis of hip, left hip: Secondary | ICD-10-CM | POA: Diagnosis not present

## 2016-12-25 DIAGNOSIS — M4317 Spondylolisthesis, lumbosacral region: Secondary | ICD-10-CM | POA: Diagnosis not present

## 2016-12-25 DIAGNOSIS — M9903 Segmental and somatic dysfunction of lumbar region: Secondary | ICD-10-CM | POA: Diagnosis not present

## 2016-12-25 DIAGNOSIS — Q72812 Congenital shortening of left lower limb: Secondary | ICD-10-CM | POA: Diagnosis not present

## 2016-12-25 DIAGNOSIS — M9904 Segmental and somatic dysfunction of sacral region: Secondary | ICD-10-CM | POA: Diagnosis not present

## 2016-12-26 DIAGNOSIS — M9904 Segmental and somatic dysfunction of sacral region: Secondary | ICD-10-CM | POA: Diagnosis not present

## 2016-12-26 DIAGNOSIS — M7071 Other bursitis of hip, right hip: Secondary | ICD-10-CM | POA: Diagnosis not present

## 2016-12-26 DIAGNOSIS — M25562 Pain in left knee: Secondary | ICD-10-CM | POA: Diagnosis not present

## 2016-12-26 DIAGNOSIS — M5387 Other specified dorsopathies, lumbosacral region: Secondary | ICD-10-CM | POA: Diagnosis not present

## 2016-12-26 DIAGNOSIS — M25561 Pain in right knee: Secondary | ICD-10-CM | POA: Diagnosis not present

## 2016-12-26 DIAGNOSIS — M9905 Segmental and somatic dysfunction of pelvic region: Secondary | ICD-10-CM | POA: Diagnosis not present

## 2016-12-26 DIAGNOSIS — M7072 Other bursitis of hip, left hip: Secondary | ICD-10-CM | POA: Diagnosis not present

## 2016-12-26 DIAGNOSIS — M9903 Segmental and somatic dysfunction of lumbar region: Secondary | ICD-10-CM | POA: Diagnosis not present

## 2016-12-26 DIAGNOSIS — Q72812 Congenital shortening of left lower limb: Secondary | ICD-10-CM | POA: Diagnosis not present

## 2016-12-26 DIAGNOSIS — M4317 Spondylolisthesis, lumbosacral region: Secondary | ICD-10-CM | POA: Diagnosis not present

## 2016-12-28 DIAGNOSIS — M9905 Segmental and somatic dysfunction of pelvic region: Secondary | ICD-10-CM | POA: Diagnosis not present

## 2016-12-28 DIAGNOSIS — M4317 Spondylolisthesis, lumbosacral region: Secondary | ICD-10-CM | POA: Diagnosis not present

## 2016-12-28 DIAGNOSIS — M5387 Other specified dorsopathies, lumbosacral region: Secondary | ICD-10-CM | POA: Diagnosis not present

## 2016-12-28 DIAGNOSIS — M9903 Segmental and somatic dysfunction of lumbar region: Secondary | ICD-10-CM | POA: Diagnosis not present

## 2016-12-28 DIAGNOSIS — M9904 Segmental and somatic dysfunction of sacral region: Secondary | ICD-10-CM | POA: Diagnosis not present

## 2016-12-28 DIAGNOSIS — M25562 Pain in left knee: Secondary | ICD-10-CM | POA: Diagnosis not present

## 2016-12-28 DIAGNOSIS — Q72812 Congenital shortening of left lower limb: Secondary | ICD-10-CM | POA: Diagnosis not present

## 2016-12-28 DIAGNOSIS — M7072 Other bursitis of hip, left hip: Secondary | ICD-10-CM | POA: Diagnosis not present

## 2016-12-28 DIAGNOSIS — M7071 Other bursitis of hip, right hip: Secondary | ICD-10-CM | POA: Diagnosis not present

## 2016-12-28 DIAGNOSIS — M25561 Pain in right knee: Secondary | ICD-10-CM | POA: Diagnosis not present

## 2017-01-01 DIAGNOSIS — Q72812 Congenital shortening of left lower limb: Secondary | ICD-10-CM | POA: Diagnosis not present

## 2017-01-01 DIAGNOSIS — M9905 Segmental and somatic dysfunction of pelvic region: Secondary | ICD-10-CM | POA: Diagnosis not present

## 2017-01-01 DIAGNOSIS — M25561 Pain in right knee: Secondary | ICD-10-CM | POA: Diagnosis not present

## 2017-01-01 DIAGNOSIS — M5387 Other specified dorsopathies, lumbosacral region: Secondary | ICD-10-CM | POA: Diagnosis not present

## 2017-01-01 DIAGNOSIS — M7071 Other bursitis of hip, right hip: Secondary | ICD-10-CM | POA: Diagnosis not present

## 2017-01-01 DIAGNOSIS — M9903 Segmental and somatic dysfunction of lumbar region: Secondary | ICD-10-CM | POA: Diagnosis not present

## 2017-01-01 DIAGNOSIS — M4317 Spondylolisthesis, lumbosacral region: Secondary | ICD-10-CM | POA: Diagnosis not present

## 2017-01-01 DIAGNOSIS — M25562 Pain in left knee: Secondary | ICD-10-CM | POA: Diagnosis not present

## 2017-01-01 DIAGNOSIS — M7072 Other bursitis of hip, left hip: Secondary | ICD-10-CM | POA: Diagnosis not present

## 2017-01-01 DIAGNOSIS — M9904 Segmental and somatic dysfunction of sacral region: Secondary | ICD-10-CM | POA: Diagnosis not present

## 2017-01-02 DIAGNOSIS — M47812 Spondylosis without myelopathy or radiculopathy, cervical region: Secondary | ICD-10-CM | POA: Diagnosis not present

## 2017-01-08 DIAGNOSIS — M5387 Other specified dorsopathies, lumbosacral region: Secondary | ICD-10-CM | POA: Diagnosis not present

## 2017-01-08 DIAGNOSIS — M25562 Pain in left knee: Secondary | ICD-10-CM | POA: Diagnosis not present

## 2017-01-08 DIAGNOSIS — M7071 Other bursitis of hip, right hip: Secondary | ICD-10-CM | POA: Diagnosis not present

## 2017-01-08 DIAGNOSIS — Q72812 Congenital shortening of left lower limb: Secondary | ICD-10-CM | POA: Diagnosis not present

## 2017-01-08 DIAGNOSIS — M9905 Segmental and somatic dysfunction of pelvic region: Secondary | ICD-10-CM | POA: Diagnosis not present

## 2017-01-08 DIAGNOSIS — M7072 Other bursitis of hip, left hip: Secondary | ICD-10-CM | POA: Diagnosis not present

## 2017-01-08 DIAGNOSIS — M4317 Spondylolisthesis, lumbosacral region: Secondary | ICD-10-CM | POA: Diagnosis not present

## 2017-01-08 DIAGNOSIS — M9903 Segmental and somatic dysfunction of lumbar region: Secondary | ICD-10-CM | POA: Diagnosis not present

## 2017-01-08 DIAGNOSIS — M25561 Pain in right knee: Secondary | ICD-10-CM | POA: Diagnosis not present

## 2017-01-08 DIAGNOSIS — M9904 Segmental and somatic dysfunction of sacral region: Secondary | ICD-10-CM | POA: Diagnosis not present

## 2017-01-10 DIAGNOSIS — M4317 Spondylolisthesis, lumbosacral region: Secondary | ICD-10-CM | POA: Diagnosis not present

## 2017-01-10 DIAGNOSIS — M25561 Pain in right knee: Secondary | ICD-10-CM | POA: Diagnosis not present

## 2017-01-10 DIAGNOSIS — M7071 Other bursitis of hip, right hip: Secondary | ICD-10-CM | POA: Diagnosis not present

## 2017-01-10 DIAGNOSIS — M25562 Pain in left knee: Secondary | ICD-10-CM | POA: Diagnosis not present

## 2017-01-10 DIAGNOSIS — M9905 Segmental and somatic dysfunction of pelvic region: Secondary | ICD-10-CM | POA: Diagnosis not present

## 2017-01-10 DIAGNOSIS — M9903 Segmental and somatic dysfunction of lumbar region: Secondary | ICD-10-CM | POA: Diagnosis not present

## 2017-01-10 DIAGNOSIS — M5387 Other specified dorsopathies, lumbosacral region: Secondary | ICD-10-CM | POA: Diagnosis not present

## 2017-01-10 DIAGNOSIS — M9904 Segmental and somatic dysfunction of sacral region: Secondary | ICD-10-CM | POA: Diagnosis not present

## 2017-01-10 DIAGNOSIS — M7072 Other bursitis of hip, left hip: Secondary | ICD-10-CM | POA: Diagnosis not present

## 2017-01-10 DIAGNOSIS — Q72812 Congenital shortening of left lower limb: Secondary | ICD-10-CM | POA: Diagnosis not present

## 2017-01-11 DIAGNOSIS — Q72812 Congenital shortening of left lower limb: Secondary | ICD-10-CM | POA: Diagnosis not present

## 2017-01-11 DIAGNOSIS — M5387 Other specified dorsopathies, lumbosacral region: Secondary | ICD-10-CM | POA: Diagnosis not present

## 2017-01-11 DIAGNOSIS — M7072 Other bursitis of hip, left hip: Secondary | ICD-10-CM | POA: Diagnosis not present

## 2017-01-11 DIAGNOSIS — M9904 Segmental and somatic dysfunction of sacral region: Secondary | ICD-10-CM | POA: Diagnosis not present

## 2017-01-11 DIAGNOSIS — M25561 Pain in right knee: Secondary | ICD-10-CM | POA: Diagnosis not present

## 2017-01-11 DIAGNOSIS — M9905 Segmental and somatic dysfunction of pelvic region: Secondary | ICD-10-CM | POA: Diagnosis not present

## 2017-01-11 DIAGNOSIS — M4317 Spondylolisthesis, lumbosacral region: Secondary | ICD-10-CM | POA: Diagnosis not present

## 2017-01-11 DIAGNOSIS — M25562 Pain in left knee: Secondary | ICD-10-CM | POA: Diagnosis not present

## 2017-01-11 DIAGNOSIS — M7071 Other bursitis of hip, right hip: Secondary | ICD-10-CM | POA: Diagnosis not present

## 2017-01-11 DIAGNOSIS — M9903 Segmental and somatic dysfunction of lumbar region: Secondary | ICD-10-CM | POA: Diagnosis not present

## 2017-01-15 DIAGNOSIS — M7071 Other bursitis of hip, right hip: Secondary | ICD-10-CM | POA: Diagnosis not present

## 2017-01-15 DIAGNOSIS — M9904 Segmental and somatic dysfunction of sacral region: Secondary | ICD-10-CM | POA: Diagnosis not present

## 2017-01-15 DIAGNOSIS — Q72812 Congenital shortening of left lower limb: Secondary | ICD-10-CM | POA: Diagnosis not present

## 2017-01-15 DIAGNOSIS — M25562 Pain in left knee: Secondary | ICD-10-CM | POA: Diagnosis not present

## 2017-01-15 DIAGNOSIS — M9905 Segmental and somatic dysfunction of pelvic region: Secondary | ICD-10-CM | POA: Diagnosis not present

## 2017-01-15 DIAGNOSIS — M4317 Spondylolisthesis, lumbosacral region: Secondary | ICD-10-CM | POA: Diagnosis not present

## 2017-01-15 DIAGNOSIS — M7072 Other bursitis of hip, left hip: Secondary | ICD-10-CM | POA: Diagnosis not present

## 2017-01-15 DIAGNOSIS — M25561 Pain in right knee: Secondary | ICD-10-CM | POA: Diagnosis not present

## 2017-01-15 DIAGNOSIS — M9903 Segmental and somatic dysfunction of lumbar region: Secondary | ICD-10-CM | POA: Diagnosis not present

## 2017-01-15 DIAGNOSIS — M5387 Other specified dorsopathies, lumbosacral region: Secondary | ICD-10-CM | POA: Diagnosis not present

## 2017-01-18 DIAGNOSIS — M9903 Segmental and somatic dysfunction of lumbar region: Secondary | ICD-10-CM | POA: Diagnosis not present

## 2017-01-18 DIAGNOSIS — M5387 Other specified dorsopathies, lumbosacral region: Secondary | ICD-10-CM | POA: Diagnosis not present

## 2017-01-18 DIAGNOSIS — M9905 Segmental and somatic dysfunction of pelvic region: Secondary | ICD-10-CM | POA: Diagnosis not present

## 2017-01-18 DIAGNOSIS — Q72812 Congenital shortening of left lower limb: Secondary | ICD-10-CM | POA: Diagnosis not present

## 2017-01-18 DIAGNOSIS — M7071 Other bursitis of hip, right hip: Secondary | ICD-10-CM | POA: Diagnosis not present

## 2017-01-18 DIAGNOSIS — M4317 Spondylolisthesis, lumbosacral region: Secondary | ICD-10-CM | POA: Diagnosis not present

## 2017-01-18 DIAGNOSIS — M25561 Pain in right knee: Secondary | ICD-10-CM | POA: Diagnosis not present

## 2017-01-18 DIAGNOSIS — M9904 Segmental and somatic dysfunction of sacral region: Secondary | ICD-10-CM | POA: Diagnosis not present

## 2017-01-18 DIAGNOSIS — M7072 Other bursitis of hip, left hip: Secondary | ICD-10-CM | POA: Diagnosis not present

## 2017-01-18 DIAGNOSIS — M25562 Pain in left knee: Secondary | ICD-10-CM | POA: Diagnosis not present

## 2017-01-22 DIAGNOSIS — M9904 Segmental and somatic dysfunction of sacral region: Secondary | ICD-10-CM | POA: Diagnosis not present

## 2017-01-22 DIAGNOSIS — M7071 Other bursitis of hip, right hip: Secondary | ICD-10-CM | POA: Diagnosis not present

## 2017-01-22 DIAGNOSIS — M5387 Other specified dorsopathies, lumbosacral region: Secondary | ICD-10-CM | POA: Diagnosis not present

## 2017-01-22 DIAGNOSIS — M4317 Spondylolisthesis, lumbosacral region: Secondary | ICD-10-CM | POA: Diagnosis not present

## 2017-01-22 DIAGNOSIS — M9905 Segmental and somatic dysfunction of pelvic region: Secondary | ICD-10-CM | POA: Diagnosis not present

## 2017-01-22 DIAGNOSIS — M7072 Other bursitis of hip, left hip: Secondary | ICD-10-CM | POA: Diagnosis not present

## 2017-01-22 DIAGNOSIS — Q72812 Congenital shortening of left lower limb: Secondary | ICD-10-CM | POA: Diagnosis not present

## 2017-01-22 DIAGNOSIS — M25561 Pain in right knee: Secondary | ICD-10-CM | POA: Diagnosis not present

## 2017-01-22 DIAGNOSIS — M9903 Segmental and somatic dysfunction of lumbar region: Secondary | ICD-10-CM | POA: Diagnosis not present

## 2017-01-22 DIAGNOSIS — M25562 Pain in left knee: Secondary | ICD-10-CM | POA: Diagnosis not present

## 2017-01-25 DIAGNOSIS — M9905 Segmental and somatic dysfunction of pelvic region: Secondary | ICD-10-CM | POA: Diagnosis not present

## 2017-01-25 DIAGNOSIS — M9904 Segmental and somatic dysfunction of sacral region: Secondary | ICD-10-CM | POA: Diagnosis not present

## 2017-01-25 DIAGNOSIS — M4317 Spondylolisthesis, lumbosacral region: Secondary | ICD-10-CM | POA: Diagnosis not present

## 2017-01-25 DIAGNOSIS — M9903 Segmental and somatic dysfunction of lumbar region: Secondary | ICD-10-CM | POA: Diagnosis not present

## 2017-01-25 DIAGNOSIS — Q72812 Congenital shortening of left lower limb: Secondary | ICD-10-CM | POA: Diagnosis not present

## 2017-01-25 DIAGNOSIS — M5387 Other specified dorsopathies, lumbosacral region: Secondary | ICD-10-CM | POA: Diagnosis not present

## 2017-01-25 DIAGNOSIS — M25562 Pain in left knee: Secondary | ICD-10-CM | POA: Diagnosis not present

## 2017-01-25 DIAGNOSIS — M25561 Pain in right knee: Secondary | ICD-10-CM | POA: Diagnosis not present

## 2017-01-25 DIAGNOSIS — M7071 Other bursitis of hip, right hip: Secondary | ICD-10-CM | POA: Diagnosis not present

## 2017-01-25 DIAGNOSIS — M7072 Other bursitis of hip, left hip: Secondary | ICD-10-CM | POA: Diagnosis not present

## 2017-01-30 DIAGNOSIS — M25562 Pain in left knee: Secondary | ICD-10-CM | POA: Diagnosis not present

## 2017-01-30 DIAGNOSIS — M7632 Iliotibial band syndrome, left leg: Secondary | ICD-10-CM | POA: Diagnosis not present

## 2017-02-01 DIAGNOSIS — M25561 Pain in right knee: Secondary | ICD-10-CM | POA: Diagnosis not present

## 2017-02-01 DIAGNOSIS — M25562 Pain in left knee: Secondary | ICD-10-CM | POA: Diagnosis not present

## 2017-02-01 DIAGNOSIS — Q72812 Congenital shortening of left lower limb: Secondary | ICD-10-CM | POA: Diagnosis not present

## 2017-02-01 DIAGNOSIS — M9903 Segmental and somatic dysfunction of lumbar region: Secondary | ICD-10-CM | POA: Diagnosis not present

## 2017-02-01 DIAGNOSIS — M9904 Segmental and somatic dysfunction of sacral region: Secondary | ICD-10-CM | POA: Diagnosis not present

## 2017-02-01 DIAGNOSIS — M4317 Spondylolisthesis, lumbosacral region: Secondary | ICD-10-CM | POA: Diagnosis not present

## 2017-02-01 DIAGNOSIS — M5387 Other specified dorsopathies, lumbosacral region: Secondary | ICD-10-CM | POA: Diagnosis not present

## 2017-02-01 DIAGNOSIS — M7072 Other bursitis of hip, left hip: Secondary | ICD-10-CM | POA: Diagnosis not present

## 2017-02-01 DIAGNOSIS — M7071 Other bursitis of hip, right hip: Secondary | ICD-10-CM | POA: Diagnosis not present

## 2017-02-01 DIAGNOSIS — M9905 Segmental and somatic dysfunction of pelvic region: Secondary | ICD-10-CM | POA: Diagnosis not present

## 2017-02-05 ENCOUNTER — Encounter: Payer: Medicare HMO | Admitting: Family Medicine

## 2017-02-06 DIAGNOSIS — M25562 Pain in left knee: Secondary | ICD-10-CM | POA: Diagnosis not present

## 2017-02-08 DIAGNOSIS — M25562 Pain in left knee: Secondary | ICD-10-CM | POA: Diagnosis not present

## 2017-02-12 DIAGNOSIS — M25562 Pain in left knee: Secondary | ICD-10-CM | POA: Diagnosis not present

## 2017-02-22 DIAGNOSIS — M25562 Pain in left knee: Secondary | ICD-10-CM | POA: Diagnosis not present

## 2017-02-22 DIAGNOSIS — Q72812 Congenital shortening of left lower limb: Secondary | ICD-10-CM | POA: Diagnosis not present

## 2017-02-22 DIAGNOSIS — M9904 Segmental and somatic dysfunction of sacral region: Secondary | ICD-10-CM | POA: Diagnosis not present

## 2017-02-22 DIAGNOSIS — M7071 Other bursitis of hip, right hip: Secondary | ICD-10-CM | POA: Diagnosis not present

## 2017-02-22 DIAGNOSIS — M7072 Other bursitis of hip, left hip: Secondary | ICD-10-CM | POA: Diagnosis not present

## 2017-02-22 DIAGNOSIS — M5387 Other specified dorsopathies, lumbosacral region: Secondary | ICD-10-CM | POA: Diagnosis not present

## 2017-02-22 DIAGNOSIS — M4317 Spondylolisthesis, lumbosacral region: Secondary | ICD-10-CM | POA: Diagnosis not present

## 2017-02-22 DIAGNOSIS — M25561 Pain in right knee: Secondary | ICD-10-CM | POA: Diagnosis not present

## 2017-02-22 DIAGNOSIS — M9903 Segmental and somatic dysfunction of lumbar region: Secondary | ICD-10-CM | POA: Diagnosis not present

## 2017-02-22 DIAGNOSIS — M9905 Segmental and somatic dysfunction of pelvic region: Secondary | ICD-10-CM | POA: Diagnosis not present

## 2017-03-22 ENCOUNTER — Encounter: Payer: Self-pay | Admitting: Family Medicine

## 2017-03-22 ENCOUNTER — Ambulatory Visit (INDEPENDENT_AMBULATORY_CARE_PROVIDER_SITE_OTHER): Payer: Medicare HMO | Admitting: Family Medicine

## 2017-03-22 VITALS — BP 110/70 | HR 64 | Temp 98.4°F | Resp 16 | Ht 65.0 in | Wt 163.0 lb

## 2017-03-22 DIAGNOSIS — R195 Other fecal abnormalities: Secondary | ICD-10-CM | POA: Diagnosis not present

## 2017-03-22 DIAGNOSIS — M810 Age-related osteoporosis without current pathological fracture: Secondary | ICD-10-CM

## 2017-03-22 DIAGNOSIS — Z0000 Encounter for general adult medical examination without abnormal findings: Secondary | ICD-10-CM | POA: Diagnosis not present

## 2017-03-22 NOTE — Progress Notes (Addendum)
Subjective:     Terri Mendoza is a 71 y.o. female and is here for a comprehensive physical exam. The patient reports no problems   Social History   Social History  . Marital status: Married    Spouse name: N/A  . Number of children: N/A  . Years of education: N/A   Occupational History  . Not on file.   Social History Main Topics  . Smoking status: Never Smoker  . Smokeless tobacco: Never Used  . Alcohol use 2.4 oz/week    4 Glasses of wine per week  . Drug use: No  . Sexual activity: Yes    Partners: Male   Other Topics Concern  . Not on file   Social History Narrative   Exercise every day   Health Maintenance  Topic Date Due  . Samul Dada  01/18/2016  . MAMMOGRAM  03/14/2017  . INFLUENZA VACCINE  05/02/2017  . COLONOSCOPY  05/01/2023  . DEXA SCAN  Completed  . Hepatitis C Screening  Completed  . PNA vac Low Risk Adult  Completed    The following portions of the patient's history were reviewed and updated as appropriate:  She  has a past medical history of Allergy; Atypical chest pain (08/31/2014); Heart murmur; Migraines; Osteopenia; and Pulmonary nodule (09/02/2014). She  does not have any pertinent problems on file. She  has a past surgical history that includes Colonoscopy. Her family history includes CAD in her father; Cancer (age of onset: 23) in her mother; Colon cancer (age of onset: 55) in her mother; Factor V Leiden deficiency in her daughter; Heart disease (age of onset: 63) in her father; Heart disease (age of onset: 3) in her mother. She  reports that she has never smoked. She has never used smokeless tobacco. She reports that she drinks about 2.4 oz of alcohol per week . She reports that she does not use drugs. She has a current medication list which includes the following prescription(s): vitamin c, diphenhydramine-acetaminophen, epinephrine, fexofenadine, fish oil-omega-3 fatty acids, fluticasone, and osteo bi-flex adv triple st. Current  Outpatient Prescriptions on File Prior to Visit  Medication Sig Dispense Refill  . Ascorbic Acid (VITAMIN C) 1000 MG tablet Take 1,000 mg by mouth daily.     . diphenhydramine-acetaminophen (TYLENOL PM) 25-500 MG TABS Take 0.5 tablets by mouth at bedtime as needed.     Marland Kitchen EPINEPHrine (ADRENACLICK) 0.3 MO/2.9 mL IJ SOAJ injection Inject 0.3 mLs (0.3 mg total) into the muscle once. 2 Device 0  . fexofenadine (ALLEGRA) 180 MG tablet Take 180 mg by mouth daily.    . fish oil-omega-3 fatty acids 1000 MG capsule Take 1 g by mouth daily.     . fluticasone (FLONASE) 50 MCG/ACT nasal spray Place 2 sprays into both nostrils daily. 16 g 6  . Misc Natural Products (OSTEO BI-FLEX ADV TRIPLE ST) TABS Take 1 tablet by mouth daily.      No current facility-administered medications on file prior to visit.    She is allergic to iohexol..  Review of Systems Review of Systems  Constitutional: Negative for activity change, appetite change and fatigue.  HENT: Negative for hearing loss, congestion, tinnitus and ear discharge.  dentist q32m Eyes: Negative for visual disturbance (see optho q1y -- vision corrected to 20/20 with glasses).  Respiratory: Negative for cough, chest tightness and shortness of breath.   Cardiovascular: Negative for chest pain, palpitations and leg swelling.  Gastrointestinal: Negative for abdominal pain, diarrhea, constipation and abdominal distention.  Genitourinary: Negative for urgency, frequency, decreased urine volume and difficulty urinating.  Musculoskeletal: Negative for back pain, arthralgias and gait problem.  Skin: Negative for color change, pallor and rash.  Neurological: Negative for dizziness, light-headedness, numbness and headaches.  Hematological: Negative for adenopathy. Does not bruise/bleed easily.  Psychiatric/Behavioral: Negative for suicidal ideas, confusion, sleep disturbance, self-injury, dysphoric mood, decreased concentration and agitation.       Objective:     BP 110/70 (BP Location: Left Arm, Cuff Size: Normal)   Pulse 64   Temp 98.4 F (36.9 C) (Oral)   Resp 16   Ht 5\' 5"  (1.651 m)   Wt 163 lb (73.9 kg)   SpO2 95%   BMI 27.12 kg/m  General appearance: alert, cooperative, appears stated age and no distress Head: Normocephalic, without obvious abnormality, atraumatic Eyes: conjunctivae/corneas clear. PERRL, EOM's intact. Fundi benign. Ears: normal TM's and external ear canals both ears Nose: Nares normal. Septum midline. Mucosa normal. No drainage or sinus tenderness. Throat: lips, mucosa, and tongue normal; teeth and gums normal Neck: no adenopathy, no carotid bruit, no JVD, supple, symmetrical, trachea midline and thyroid not enlarged, symmetric, no tenderness/mass/nodules Back: symmetric, no curvature. ROM normal. No CVA tenderness. Lungs: clear to auscultation bilaterally Breasts: normal appearance, no masses or tenderness Heart: S1, S2 normal Abdomen: soft, non-tender; bowel sounds normal; no masses,  no organomegaly Pelvic: deferred  Extremities: extremities normal, atraumatic, no cyanosis or edema Pulses: 2+ and symmetric Skin: Skin color, texture, turgor normal. No rashes or lesions Lymph nodes: Cervical, supraclavicular, and axillary nodes normal. Neurologic: Alert and oriented X 3, normal strength and tone. Normal symmetric reflexes. Normal coordination and gait    Assessment:    Healthy female exam.      Plan:    ghm utd Check labs See After Visit Summary for Counseling  Recommendations    1. Preventative health care See above - Fecal occult blood, imunochemical; Future - CBC with Differential/Platelet; Future - Comprehensive metabolic panel; Future - Lipid panel; Future  2. Osteoporosis, unspecified osteoporosis type, unspecified pathological fracture presence  - DG Bone Density; Future  3. Heme positive stool ifob Pt reports-- check ifob-- GI referral if positive

## 2017-03-22 NOTE — Patient Instructions (Signed)
Preventive Care 65 Years and Older, Female Preventive care refers to lifestyle choices and visits with your health care provider that can promote health and wellness. What does preventive care include?  A yearly physical exam. This is also called an annual well check.  Dental exams once or twice a year.  Routine eye exams. Ask your health care provider how often you should have your eyes checked.  Personal lifestyle choices, including: ? Daily care of your teeth and gums. ? Regular physical activity. ? Eating a healthy diet. ? Avoiding tobacco and drug use. ? Limiting alcohol use. ? Practicing safe sex. ? Taking low-dose aspirin every day. ? Taking vitamin and mineral supplements as recommended by your health care provider. What happens during an annual well check? The services and screenings done by your health care provider during your annual well check will depend on your age, overall health, lifestyle risk factors, and family history of disease. Counseling Your health care provider may ask you questions about your:  Alcohol use.  Tobacco use.  Drug use.  Emotional well-being.  Home and relationship well-being.  Sexual activity.  Eating habits.  History of falls.  Memory and ability to understand (cognition).  Work and work environment.  Reproductive health.  Screening You may have the following tests or measurements:  Height, weight, and BMI.  Blood pressure.  Lipid and cholesterol levels. These may be checked every 5 years, or more frequently if you are over 50 years old.  Skin check.  Lung cancer screening. You may have this screening every year starting at age 55 if you have a 30-pack-year history of smoking and currently smoke or have quit within the past 15 years.  Fecal occult blood test (FOBT) of the stool. You may have this test every year starting at age 50.  Flexible sigmoidoscopy or colonoscopy. You may have a sigmoidoscopy every 5 years or  a colonoscopy every 10 years starting at age 50.  Hepatitis C blood test.  Hepatitis B blood test.  Sexually transmitted disease (STD) testing.  Diabetes screening. This is done by checking your blood sugar (glucose) after you have not eaten for a while (fasting). You may have this done every 1-3 years.  Bone density scan. This is done to screen for osteoporosis. You may have this done starting at age 65.  Mammogram. This may be done every 1-2 years. Talk to your health care provider about how often you should have regular mammograms.  Talk with your health care provider about your test results, treatment options, and if necessary, the need for more tests. Vaccines Your health care provider may recommend certain vaccines, such as:  Influenza vaccine. This is recommended every year.  Tetanus, diphtheria, and acellular pertussis (Tdap, Td) vaccine. You may need a Td booster every 10 years.  Varicella vaccine. You may need this if you have not been vaccinated.  Zoster vaccine. You may need this after age 60.  Measles, mumps, and rubella (MMR) vaccine. You may need at least one dose of MMR if you were born in 1957 or later. You may also need a second dose.  Pneumococcal 13-valent conjugate (PCV13) vaccine. One dose is recommended after age 65.  Pneumococcal polysaccharide (PPSV23) vaccine. One dose is recommended after age 65.  Meningococcal vaccine. You may need this if you have certain conditions.  Hepatitis A vaccine. You may need this if you have certain conditions or if you travel or work in places where you may be exposed to hepatitis   A.  Hepatitis B vaccine. You may need this if you have certain conditions or if you travel or work in places where you may be exposed to hepatitis B.  Haemophilus influenzae type b (Hib) vaccine. You may need this if you have certain conditions.  Talk to your health care provider about which screenings and vaccines you need and how often you  need them. This information is not intended to replace advice given to you by your health care provider. Make sure you discuss any questions you have with your health care provider. Document Released: 10/15/2015 Document Revised: 06/07/2016 Document Reviewed: 07/20/2015 Elsevier Interactive Patient Education  2017 [employer].

## 2017-03-29 DIAGNOSIS — M79672 Pain in left foot: Secondary | ICD-10-CM | POA: Diagnosis not present

## 2017-04-16 ENCOUNTER — Other Ambulatory Visit: Payer: Self-pay | Admitting: Family Medicine

## 2017-04-16 ENCOUNTER — Encounter: Payer: Self-pay | Admitting: Family Medicine

## 2017-04-16 DIAGNOSIS — Z1231 Encounter for screening mammogram for malignant neoplasm of breast: Secondary | ICD-10-CM

## 2017-04-18 ENCOUNTER — Encounter: Payer: Self-pay | Admitting: Family Medicine

## 2017-04-18 ENCOUNTER — Other Ambulatory Visit: Payer: Self-pay | Admitting: Family Medicine

## 2017-04-18 ENCOUNTER — Other Ambulatory Visit (INDEPENDENT_AMBULATORY_CARE_PROVIDER_SITE_OTHER): Payer: Medicare HMO

## 2017-04-18 DIAGNOSIS — Z0000 Encounter for general adult medical examination without abnormal findings: Secondary | ICD-10-CM

## 2017-04-18 DIAGNOSIS — J302 Other seasonal allergic rhinitis: Secondary | ICD-10-CM

## 2017-04-18 LAB — COMPREHENSIVE METABOLIC PANEL
ALK PHOS: 58 U/L (ref 39–117)
ALT: 14 U/L (ref 0–35)
AST: 21 U/L (ref 0–37)
Albumin: 4.3 g/dL (ref 3.5–5.2)
BUN: 21 mg/dL (ref 6–23)
CO2: 28 meq/L (ref 19–32)
CREATININE: 0.66 mg/dL (ref 0.40–1.20)
Calcium: 9.6 mg/dL (ref 8.4–10.5)
Chloride: 102 mEq/L (ref 96–112)
GFR: 94.44 mL/min (ref >60.00)
GLUCOSE: 89 mg/dL (ref 70–99)
Potassium: 3.9 mEq/L (ref 3.5–5.1)
Sodium: 137 mEq/L (ref 135–145)
TOTAL PROTEIN: 6.9 g/dL (ref 6.0–8.3)
Total Bilirubin: 0.5 mg/dL (ref 0.2–1.2)

## 2017-04-18 LAB — CBC WITH DIFFERENTIAL/PLATELET
BASOS ABS: 0 10*3/uL (ref 0.0–0.1)
Basophils Relative: 0.3 % (ref 0.0–3.0)
EOS ABS: 0.1 10*3/uL (ref 0.0–0.7)
Eosinophils Relative: 1.2 % (ref 0.0–5.0)
HCT: 43.3 % (ref 36.0–46.0)
Hemoglobin: 14.4 g/dL (ref 12.0–15.0)
LYMPHS ABS: 1.6 10*3/uL (ref 0.7–4.0)
LYMPHS PCT: 23.2 % (ref 12.0–46.0)
MCHC: 33.2 g/dL (ref 30.0–36.0)
MCV: 93.7 fl (ref 78.0–100.0)
MONOS PCT: 8.2 % (ref 3.0–12.0)
Monocytes Absolute: 0.6 10*3/uL (ref 0.1–1.0)
NEUTROS PCT: 67.1 % (ref 43.0–77.0)
Neutro Abs: 4.7 10*3/uL (ref 1.4–7.7)
PLATELETS: 196 10*3/uL (ref 150.0–400.0)
RBC: 4.62 Mil/uL (ref 3.87–5.11)
RDW: 13.3 % (ref 11.5–15.5)
WBC: 7 10*3/uL (ref 4.0–10.5)

## 2017-04-18 LAB — LIPID PANEL
CHOL/HDL RATIO: 3
Cholesterol: 190 mg/dL (ref 0–200)
HDL: 70 mg/dL (ref >39.00)
LDL Cholesterol: 107 mg/dL — ABNORMAL HIGH (ref 0–99)
NONHDL: 119.71
Triglycerides: 65 mg/dL (ref 0.0–149.0)
VLDL: 13 mg/dL (ref 0.0–40.0)

## 2017-04-20 NOTE — Telephone Encounter (Signed)
If she is having symptoms that is fine but if she is coming in here we can do it quickly here

## 2017-04-26 ENCOUNTER — Ambulatory Visit
Admission: RE | Admit: 2017-04-26 | Discharge: 2017-04-26 | Disposition: A | Discharge status: Home / Self Care | Payer: Medicare HMO | Source: Ambulatory Visit | Attending: Family Medicine | Admitting: Family Medicine

## 2017-04-26 DIAGNOSIS — M810 Age-related osteoporosis without current pathological fracture: Secondary | ICD-10-CM

## 2017-04-26 DIAGNOSIS — Z1231 Encounter for screening mammogram for malignant neoplasm of breast: Secondary | ICD-10-CM | POA: Diagnosis not present

## 2017-04-26 DIAGNOSIS — M8589 Other specified disorders of bone density and structure, multiple sites: Secondary | ICD-10-CM | POA: Diagnosis not present

## 2017-04-26 DIAGNOSIS — Z780 Asymptomatic menopausal state: Secondary | ICD-10-CM | POA: Diagnosis not present

## 2017-04-27 ENCOUNTER — Other Ambulatory Visit (INDEPENDENT_AMBULATORY_CARE_PROVIDER_SITE_OTHER): Payer: Medicare HMO

## 2017-04-27 DIAGNOSIS — Z0000 Encounter for general adult medical examination without abnormal findings: Secondary | ICD-10-CM | POA: Diagnosis not present

## 2017-04-27 LAB — FECAL OCCULT BLOOD, IMMUNOCHEMICAL: FECAL OCCULT BLD: NEGATIVE

## 2017-05-02 DIAGNOSIS — H40019 Open angle with borderline findings, low risk, unspecified eye: Secondary | ICD-10-CM | POA: Diagnosis not present

## 2017-05-02 DIAGNOSIS — H524 Presbyopia: Secondary | ICD-10-CM | POA: Diagnosis not present

## 2017-11-01 DIAGNOSIS — R69 Illness, unspecified: Secondary | ICD-10-CM | POA: Diagnosis not present

## 2017-11-05 DIAGNOSIS — Z809 Family history of malignant neoplasm, unspecified: Secondary | ICD-10-CM | POA: Diagnosis not present

## 2017-11-22 DIAGNOSIS — H16101 Unspecified superficial keratitis, right eye: Secondary | ICD-10-CM | POA: Diagnosis not present

## 2017-11-22 DIAGNOSIS — H1852 Epithelial (juvenile) corneal dystrophy: Secondary | ICD-10-CM | POA: Diagnosis not present

## 2017-11-23 DIAGNOSIS — L603 Nail dystrophy: Secondary | ICD-10-CM | POA: Diagnosis not present

## 2017-11-23 DIAGNOSIS — L820 Inflamed seborrheic keratosis: Secondary | ICD-10-CM | POA: Diagnosis not present

## 2017-11-23 DIAGNOSIS — L570 Actinic keratosis: Secondary | ICD-10-CM | POA: Diagnosis not present

## 2017-11-23 DIAGNOSIS — L218 Other seborrheic dermatitis: Secondary | ICD-10-CM | POA: Diagnosis not present

## 2017-11-23 DIAGNOSIS — L821 Other seborrheic keratosis: Secondary | ICD-10-CM | POA: Diagnosis not present

## 2017-11-23 DIAGNOSIS — D1801 Hemangioma of skin and subcutaneous tissue: Secondary | ICD-10-CM | POA: Diagnosis not present

## 2017-11-26 DIAGNOSIS — R69 Illness, unspecified: Secondary | ICD-10-CM | POA: Diagnosis not present

## 2017-11-28 DIAGNOSIS — H16101 Unspecified superficial keratitis, right eye: Secondary | ICD-10-CM | POA: Diagnosis not present

## 2017-12-27 ENCOUNTER — Ambulatory Visit (INDEPENDENT_AMBULATORY_CARE_PROVIDER_SITE_OTHER): Payer: Medicare HMO | Admitting: Family Medicine

## 2017-12-27 ENCOUNTER — Encounter: Payer: Self-pay | Admitting: Family Medicine

## 2017-12-27 VITALS — BP 122/64 | HR 67 | Temp 98.4°F | Resp 16 | Ht 64.96 in | Wt 161.0 lb

## 2017-12-27 DIAGNOSIS — R5383 Other fatigue: Secondary | ICD-10-CM | POA: Diagnosis not present

## 2017-12-27 DIAGNOSIS — J069 Acute upper respiratory infection, unspecified: Secondary | ICD-10-CM | POA: Diagnosis not present

## 2017-12-27 MED ORDER — LEVOCETIRIZINE DIHYDROCHLORIDE 5 MG PO TABS
5.0000 mg | ORAL_TABLET | Freq: Every evening | ORAL | 5 refills | Status: DC
Start: 2017-12-27 — End: 2018-06-04

## 2017-12-27 NOTE — Patient Instructions (Signed)
Upper Respiratory Infection, Adult Most upper respiratory infections (URIs) are caused by a virus. A URI affects the nose, throat, and upper air passages. The most common type of URI is often called "the common cold." Follow these instructions at home:  Take medicines only as told by your doctor.  Gargle warm saltwater or take cough drops to comfort your throat as told by your doctor.  Use a warm mist humidifier or inhale steam from a shower to increase air moisture. This may make it easier to breathe.  Drink enough fluid to keep your pee (urine) clear or pale yellow.  Eat soups and other clear broths.  Have a healthy diet.  Rest as needed.  Go back to work when your fever is gone or your doctor says it is okay. ? You may need to stay home longer to avoid giving your URI to others. ? You can also wear a face mask and wash your hands often to prevent spread of the virus.  Use your inhaler more if you have asthma.  Do not use any tobacco products, including cigarettes, chewing tobacco, or electronic cigarettes. If you need help quitting, ask your doctor. Contact a doctor if:  You are getting worse, not better.  Your symptoms are not helped by medicine.  You have chills.  You are getting more short of breath.  You have brown or red mucus.  You have yellow or brown discharge from your nose.  You have pain in your face, especially when you bend forward.  You have a fever.  You have puffy (swollen) neck glands.  You have pain while swallowing.  You have white areas in the back of your throat. Get help right away if:  You have very bad or constant: ? Headache. ? Ear pain. ? Pain in your forehead, behind your eyes, and over your cheekbones (sinus pain). ? Chest pain.  You have long-lasting (chronic) lung disease and any of the following: ? Wheezing. ? Long-lasting cough. ? Coughing up blood. ? A change in your usual mucus.  You have a stiff neck.  You have  changes in your: ? Vision. ? Hearing. ? Thinking. ? Mood. This information is not intended to replace advice given to you by your health care provider. Make sure you discuss any questions you have with your health care provider. Document Released: 03/06/2008 Document Revised: 05/21/2016 Document Reviewed: 12/24/2013 Elsevier Interactive Patient Education  2018 Elsevier Inc.  

## 2017-12-27 NOTE — Progress Notes (Signed)
Subjective:  I acted as a Education administrator for [employer]. Yancey Flemings, [place]   Patient ID: Terri Mendoza, female    DOB: 03-13-1946, 72 y.o.   MRN: [id number]  Chief Complaint  Patient presents with  . Ear Pain    left ear off and for 3 weeks  . Nasal Congestion    3 weeks    HPI  Patient is in today for ear pain, and nasal congestion.    Pt has had pnd and congestion since March 6 --- she was using flonase and c/o no energy.   Monday night everything flared up again.   Pt had a fever 99 in the beginning but not since .    No sinus pressure .  + pressure in L ear.    Pt chest pressure with dry cough.   Patient Care Team: Carollee Herter, Alferd Apa, DO as PCP - General Rigoberto Noel, MD as Consulting Physician (Pulmonary Disease) Harriett Sine, MD as Consulting Physician (Dermatology)   Past Medical History:  Diagnosis Date  . Allergy   . Atypical chest pain 08/31/2014   persistent CP for 7 days, negative stress echo with EF 65% on 09/01/2014  . Heart murmur   . Migraines   . Osteopenia   . Pulmonary nodule 09/02/2014   70mm LUL pulmonary nodule seen on CT    Past Surgical History:  Procedure Laterality Date  . COLONOSCOPY      Family History  Problem Relation Age of Onset  . Heart disease Mother 9       chf,  valve repair  . Cancer Mother 56       colon  . Colon cancer Mother 30  . Heart disease Father 86       chf  . CAD Father   . Factor V Leiden deficiency Daughter   . Heart failure Unknown        CHF  . Coronary artery disease Unknown     Social History   Socioeconomic History  . Marital status: Married    Spouse name: Not on file  . Number of children: Not on file  . Years of education: Not on file  . Highest education level: Not on file  Occupational History  . Not on file  Social Needs  . Financial resource strain: Not on file  . Food insecurity:    Worry: Not on file    Inability: Not on file  . Transportation needs:    Medical: Not on file    Non-medical: Not on file  Tobacco Use  . Smoking status: Never Smoker  . Smokeless tobacco: Never Used  Substance and Sexual Activity  . Alcohol use: Yes    Alcohol/week: 2.4 oz    Types: 4 Glasses of wine per week  . Drug use: No  . Sexual activity: Yes    Partners: Male  Lifestyle  . Physical activity:    Days per week: Not on file    Minutes per session: Not on file  . Stress: Not on file  Relationships  . Social connections:    Talks on phone: Not on file    Gets together: Not on file    Attends religious service: Not on file    Active member of club or organization: Not on file    Attends meetings of clubs or organizations: Not on file    Relationship status: Not on file  . Intimate partner violence:    Fear of current or  ex partner: Not on file    Emotionally abused: Not on file    Physically abused: Not on file    Forced sexual activity: Not on file  Other Topics Concern  . Not on file  Social History Narrative   Exercise every day    Outpatient Medications Prior to Visit  Medication Sig Dispense Refill  . Ascorbic Acid (VITAMIN C) 1000 MG tablet Take 1,000 mg by mouth daily.     . diphenhydramine-acetaminophen (TYLENOL PM) 25-500 MG TABS Take 0.5 tablets by mouth at bedtime as needed.     Marland Kitchen EPINEPHrine (ADRENACLICK) 0.3 HY/0.7 mL IJ SOAJ injection Inject 0.3 mLs (0.3 mg total) into the muscle once. 2 Device 0  . fish oil-omega-3 fatty acids 1000 MG capsule Take 1 g by mouth daily.     . fluticasone (FLONASE) 50 MCG/ACT nasal spray USE TWO SPRAY(S) IN EACH NOSTRIL ONCE DAILY 16 g 6  . Misc Natural Products (OSTEO BI-FLEX ADV TRIPLE ST) TABS Take 1 tablet by mouth daily.     . fexofenadine (ALLEGRA) 180 MG tablet Take 180 mg by mouth daily.     No facility-administered medications prior to visit.     Allergies  Allergen Reactions  . Iohexol Hives and Swelling    Review of Systems  Constitutional: Negative for fever and malaise/fatigue.  HENT: Positive for  congestion, ear pain, sinus pain and sore throat.   Eyes: Negative for blurred vision.  Respiratory: Positive for cough. Negative for sputum production, shortness of breath and wheezing.   Cardiovascular: Negative for chest pain, palpitations and leg swelling.  Gastrointestinal: Negative for abdominal pain, blood in stool, nausea and vomiting.  Genitourinary: Negative for dysuria and frequency.  Musculoskeletal: Negative for back pain and falls.  Skin: Negative for rash.  Neurological: Negative for dizziness, loss of consciousness and headaches.  Endo/Heme/Allergies: Negative for environmental allergies.  Psychiatric/Behavioral: Negative for depression. The patient is not nervous/anxious.        Objective:    Physical Exam  Constitutional: She is oriented to person, place, and time. She appears well-developed and well-nourished.  HENT:  Head: Normocephalic and atraumatic.  Right Ear: External ear normal.  Left Ear: External ear normal.  + PND + errythema  Eyes: Conjunctivae and EOM are normal. Right eye exhibits no discharge. Left eye exhibits no discharge.  Neck: Normal range of motion. Neck supple. No JVD present. Carotid bruit is not present. No thyromegaly present.  Cardiovascular: Normal rate, regular rhythm and normal heart sounds.  No murmur heard. Pulmonary/Chest: Effort normal and breath sounds normal. No respiratory distress. She has no wheezes. She has no rales. She exhibits no tenderness.  Musculoskeletal: She exhibits no edema.  Lymphadenopathy:    She has no cervical adenopathy.  Neurological: She is alert and oriented to person, place, and time.  Psychiatric: She has a normal mood and affect.  Nursing note and vitals reviewed.   BP 122/64 (BP Location: Left Arm, Patient Position: Sitting, Cuff Size: Normal)   Pulse 67   Temp 98.4 F (36.9 C) (Oral)   Resp 16   Ht 5' 4.96" (1.65 m)   Wt 161 lb (73 kg)   SpO2 96%   BMI 26.82 kg/m  Wt Readings from Last 3  Encounters:  12/27/17 161 lb (73 kg)  03/22/17 163 lb (73.9 kg)  06/20/16 164 lb 3.2 oz (74.5 kg)   BP Readings from Last 3 Encounters:  12/27/17 122/64  03/22/17 110/70  06/20/16 100/62  Immunization History  Administered Date(s) Administered  . Influenza,inj,Quad PF,6+ Mos 07/02/2014  . Pneumococcal Conjugate-13 10/26/2014  . Pneumococcal Polysaccharide-23 01/31/2016  . Td 01/17/2006  . Zoster 08/07/2011    Health Maintenance  Topic Date Due  . TETANUS/TDAP  12/28/2018 (Originally 01/18/2016)  . INFLUENZA VACCINE  08/30/2019 (Originally 05/02/2017)  . MAMMOGRAM  04/26/2018  . COLONOSCOPY  05/01/2023  . DEXA SCAN  Completed  . Hepatitis C Screening  Completed  . PNA vac Low Risk Adult  Completed    Lab Results  Component Value Date   WBC 6.3 12/27/2017   HGB 14.4 12/27/2017   HCT 42.9 12/27/2017   PLT 201.0 12/27/2017   GLUCOSE 131 (H) 12/27/2017   CHOL 190 04/18/2017   TRIG 65.0 04/18/2017   HDL 70.00 04/18/2017   LDLCALC 107 (H) 04/18/2017   ALT 16 12/27/2017   AST 23 12/27/2017   NA 139 12/27/2017   K 4.9 12/27/2017   CL 103 12/27/2017   CREATININE 0.57 12/27/2017   BUN 26 (H) 12/27/2017   CO2 28 12/27/2017   TSH 2.18 12/27/2017   INR 1.11 09/01/2014   HGBA1C 5.9 (H) 08/31/2014    Lab Results  Component Value Date   TSH 2.18 12/27/2017   Lab Results  Component Value Date   WBC 6.3 12/27/2017   HGB 14.4 12/27/2017   HCT 42.9 12/27/2017   MCV 93.9 12/27/2017   PLT 201.0 12/27/2017   Lab Results  Component Value Date   NA 139 12/27/2017   K 4.9 12/27/2017   CO2 28 12/27/2017   GLUCOSE 131 (H) 12/27/2017   BUN 26 (H) 12/27/2017   CREATININE 0.57 12/27/2017   BILITOT 0.4 12/27/2017   ALKPHOS 64 12/27/2017   AST 23 12/27/2017   ALT 16 12/27/2017   PROT 7.4 12/27/2017   ALBUMIN 4.1 12/27/2017   CALCIUM 9.8 12/27/2017   ANIONGAP 13 09/01/2014   GFR 111.62 12/27/2017   Lab Results  Component Value Date   CHOL 190 04/18/2017   Lab  Results  Component Value Date   HDL 70.00 04/18/2017   Lab Results  Component Value Date   LDLCALC 107 (H) 04/18/2017   Lab Results  Component Value Date   TRIG 65.0 04/18/2017   Lab Results  Component Value Date   CHOLHDL 3 04/18/2017   Lab Results  Component Value Date   HGBA1C 5.9 (H) 08/31/2014         Assessment & Plan:   Problem List Items Addressed This Visit    None    Visit Diagnoses    Upper respiratory tract infection, unspecified type    -  Primary   Relevant Medications   levocetirizine (XYZAL) 5 MG tablet   Fatigue, unspecified type       Relevant Orders   CBC with Differential/Platelet (Completed)   TSH (Completed)   Vitamin B12 (Completed)   Vitamin D 1,25 dihydroxy   Comprehensive metabolic panel (Completed)      I have discontinued Terri Mendoza. Mendoza's fexofenadine. I am also having her start on levocetirizine. Additionally, I am having her maintain her fish oil-omega-3 fatty acids, OSTEO BI-FLEX ADV TRIPLE ST, vitamin C, diphenhydramine-acetaminophen, EPINEPHrine, and fluticasone.  Meds ordered this encounter  Medications  . levocetirizine (XYZAL) 5 MG tablet    Sig: Take 1 tablet (5 mg total) by mouth every evening.    Dispense:  30 tablet    Refill:  5    CMA served as scribe during this visit. History,  Physical and Plan performed by medical provider. Documentation and orders reviewed and attested to.  [name], DO

## 2017-12-28 LAB — CBC WITH DIFFERENTIAL/PLATELET
BASOS PCT: 1.3 % (ref 0.0–3.0)
Basophils Absolute: 0.1 10*3/uL (ref 0.0–0.1)
EOS ABS: 0.1 10*3/uL (ref 0.0–0.7)
EOS PCT: 1.1 % (ref 0.0–5.0)
HCT: 42.9 % (ref 36.0–46.0)
HEMOGLOBIN: 14.4 g/dL (ref 12.0–15.0)
Lymphocytes Relative: 18.2 % (ref 12.0–46.0)
Lymphs Abs: 1.1 10*3/uL (ref 0.7–4.0)
MCHC: 33.5 g/dL (ref 30.0–36.0)
MCV: 93.9 fl (ref 78.0–100.0)
MONO ABS: 0.8 10*3/uL (ref 0.1–1.0)
Monocytes Relative: 12.8 % — ABNORMAL HIGH (ref 3.0–12.0)
Neutro Abs: 4.2 10*3/uL (ref 1.4–7.7)
Neutrophils Relative %: 66.6 % (ref 43.0–77.0)
Platelets: 201 10*3/uL (ref 150.0–400.0)
RBC: 4.57 Mil/uL (ref 3.87–5.11)
RDW: 13.6 % (ref 11.5–15.5)
WBC: 6.3 10*3/uL (ref 4.0–10.5)

## 2017-12-28 LAB — COMPREHENSIVE METABOLIC PANEL
ALK PHOS: 64 U/L (ref 39–117)
ALT: 16 U/L (ref 0–35)
AST: 23 U/L (ref 0–37)
Albumin: 4.1 g/dL (ref 3.5–5.2)
BILIRUBIN TOTAL: 0.4 mg/dL (ref 0.2–1.2)
BUN: 26 mg/dL — AB (ref 6–23)
CO2: 28 meq/L (ref 19–32)
CREATININE: 0.57 mg/dL (ref 0.40–1.20)
Calcium: 9.8 mg/dL (ref 8.4–10.5)
Chloride: 103 mEq/L (ref 96–112)
GFR: 111.62 mL/min (ref >60.00)
GLUCOSE: 131 mg/dL — AB (ref 70–99)
Potassium: 4.9 mEq/L (ref 3.5–5.1)
Sodium: 139 mEq/L (ref 135–145)
Total Protein: 7.4 g/dL (ref 6.0–8.3)

## 2017-12-28 LAB — VITAMIN B12: VITAMIN B 12: 616 pg/mL (ref 211–911)

## 2017-12-28 LAB — TSH: TSH: 2.18 u[IU]/mL (ref 0.35–4.50)

## 2017-12-30 LAB — VITAMIN D 1,25 DIHYDROXY
Vitamin D 1, 25 (OH)2 Total: 78 pg/mL — ABNORMAL HIGH (ref 18–72)
Vitamin D2 1, 25 (OH)2: <8 pg/mL
Vitamin D3 1, 25 (OH)2: 78 pg/mL

## 2017-12-31 ENCOUNTER — Encounter: Payer: Self-pay | Admitting: Family Medicine

## 2018-01-02 ENCOUNTER — Other Ambulatory Visit: Payer: Self-pay | Admitting: *Deleted

## 2018-01-02 DIAGNOSIS — R739 Hyperglycemia, unspecified: Secondary | ICD-10-CM

## 2018-04-08 DIAGNOSIS — M25551 Pain in right hip: Secondary | ICD-10-CM | POA: Diagnosis not present

## 2018-04-08 DIAGNOSIS — M7061 Trochanteric bursitis, right hip: Secondary | ICD-10-CM | POA: Diagnosis not present

## 2018-04-15 ENCOUNTER — Other Ambulatory Visit: Payer: Self-pay | Admitting: Family Medicine

## 2018-04-15 DIAGNOSIS — Z1231 Encounter for screening mammogram for malignant neoplasm of breast: Secondary | ICD-10-CM

## 2018-05-07 ENCOUNTER — Ambulatory Visit
Admission: RE | Admit: 2018-05-07 | Discharge: 2018-05-07 | Disposition: A | Discharge status: Home / Self Care | Payer: Medicare HMO | Source: Ambulatory Visit | Attending: Family Medicine | Admitting: Family Medicine

## 2018-05-07 DIAGNOSIS — Z1231 Encounter for screening mammogram for malignant neoplasm of breast: Secondary | ICD-10-CM

## 2018-05-13 DIAGNOSIS — H40019 Open angle with borderline findings, low risk, unspecified eye: Secondary | ICD-10-CM | POA: Diagnosis not present

## 2018-05-13 DIAGNOSIS — Z0100 Encounter for examination of eyes and vision without abnormal findings: Secondary | ICD-10-CM | POA: Diagnosis not present

## 2018-05-13 DIAGNOSIS — H52223 Regular astigmatism, bilateral: Secondary | ICD-10-CM | POA: Diagnosis not present

## 2018-05-15 DIAGNOSIS — R69 Illness, unspecified: Secondary | ICD-10-CM | POA: Diagnosis not present

## 2018-06-04 ENCOUNTER — Encounter: Payer: Self-pay | Admitting: Family Medicine

## 2018-06-04 ENCOUNTER — Encounter: Payer: Medicare HMO | Admitting: Family Medicine

## 2018-06-04 ENCOUNTER — Ambulatory Visit (INDEPENDENT_AMBULATORY_CARE_PROVIDER_SITE_OTHER): Payer: Medicare HMO | Admitting: Family Medicine

## 2018-06-04 VITALS — BP 111/58 | HR 62 | Temp 98.2°F | Resp 16 | Ht 65.0 in | Wt 161.0 lb

## 2018-06-04 DIAGNOSIS — H8110 Benign paroxysmal vertigo, unspecified ear: Secondary | ICD-10-CM | POA: Diagnosis not present

## 2018-06-04 DIAGNOSIS — R739 Hyperglycemia, unspecified: Secondary | ICD-10-CM

## 2018-06-04 DIAGNOSIS — Z0000 Encounter for general adult medical examination without abnormal findings: Secondary | ICD-10-CM | POA: Diagnosis not present

## 2018-06-04 DIAGNOSIS — E785 Hyperlipidemia, unspecified: Secondary | ICD-10-CM | POA: Diagnosis not present

## 2018-06-04 NOTE — Progress Notes (Signed)
Patient ID: RAQUEL RACEY, female    DOB: 12-03-1945  Age: 72 y.o. MRN: [id number]    Subjective:  Subjective  HPI Terri Mendoza presents for cpe and labs. Terri Mendoza c/o about hx BPV and it has returned.  Terri Mendoza had done the [facility] then and it took care of it. Terri Mendoza also c/o stress and muscle spasm at base of neck and shoulders.  Terri Mendoza does not want a muscle relaxer   Pain will cause Terri Mendoza migraines to flare.  excedrin migraine takes it away.-- it does not occur often  Review of Systems  Constitutional: Negative for appetite change, chills, diaphoresis, fatigue, fever and unexpected weight change.  HENT: Negative for congestion, hearing loss and tinnitus.   Eyes: Negative for pain, discharge, redness and visual disturbance.  Respiratory: Negative for cough, chest tightness, shortness of breath and wheezing.   Cardiovascular: Negative for chest pain, palpitations and leg swelling.  Gastrointestinal: Negative for abdominal pain, blood in stool, constipation, diarrhea, nausea and vomiting.  Endocrine: Negative for cold intolerance, heat intolerance, polydipsia, polyphagia and polyuria.  Genitourinary: Negative for difficulty urinating, dysuria, frequency, hematuria and urgency.  Musculoskeletal: Negative for back pain and myalgias.  Skin: Negative for rash.  Allergic/Immunologic: Negative for environmental allergies.  Neurological: Positive for dizziness. Negative for weakness, light-headedness, numbness and headaches.  Hematological: Does not bruise/bleed easily.  Psychiatric/Behavioral: Negative for dysphoric mood and suicidal ideas. The patient is not nervous/anxious.     History Past Medical History:  Diagnosis Date  . Allergy   . Atypical chest pain 08/31/2014   persistent CP for 7 days, negative stress echo with EF 65% on 09/01/2014  . Heart murmur   . Migraines   . Osteopenia   . Pulmonary nodule 09/02/2014   62mm LUL pulmonary nodule seen on CT    Terri Mendoza has a past surgical  history that includes Colonoscopy.   Terri Mendoza family history includes CAD in Terri Mendoza father; Cancer (age of onset: 70) in Terri Mendoza mother; Colon cancer (age of onset: 29) in Terri Mendoza mother; Coronary artery disease in Terri Mendoza unknown relative; Factor V Leiden deficiency in Terri Mendoza daughter; Heart disease (age of onset: 15) in Terri Mendoza father; Heart disease (age of onset: 87) in Terri Mendoza mother; Heart failure in Terri Mendoza unknown relative.Terri Mendoza reports that Terri Mendoza has never smoked. Terri Mendoza has never used smokeless tobacco. Terri Mendoza reports that Terri Mendoza drinks about 4.0 standard drinks of alcohol per week. Terri Mendoza reports that Terri Mendoza does not use drugs.  Current Outpatient Medications on File Prior to Visit  Medication Sig Dispense Refill  . Ascorbic Acid (VITAMIN C) 1000 MG tablet Take 1,000 mg by mouth daily.     . diphenhydramine-acetaminophen (TYLENOL PM) 25-500 MG TABS Take 0.5 tablets by mouth at bedtime as needed.     Marland Kitchen EPINEPHrine (ADRENACLICK) 0.3 KZ/6.0 mL IJ SOAJ injection Inject 0.3 mLs (0.3 mg total) into the muscle once. 2 Device 0  . fish oil-omega-3 fatty acids 1000 MG capsule Take 1 g by mouth daily.     . Misc Natural Products (OSTEO BI-FLEX ADV TRIPLE ST) TABS Take 1 tablet by mouth daily.      No current facility-administered medications on file prior to visit.      Objective:  Objective  Physical Exam  Constitutional: Terri Mendoza is oriented to person, place, and time. Terri Mendoza appears well-developed and well-nourished. No distress.  HENT:  Head: Normocephalic and atraumatic.  Right Ear: External ear normal.  Left Ear: External ear normal.  Nose: Nose normal.  Mouth/Throat: Oropharynx is clear  and moist.  Eyes: Pupils are equal, round, and reactive to light. Conjunctivae and EOM are normal. Right eye exhibits no discharge. Left eye exhibits no discharge.  Neck: Normal range of motion. Neck supple. No JVD present. No thyromegaly present.  Cardiovascular: Normal rate, regular rhythm, normal heart sounds and intact distal pulses.  No murmur  heard. Pulmonary/Chest: Effort normal and breath sounds normal. No respiratory distress. Terri Mendoza has no wheezes. Terri Mendoza has no rales. Terri Mendoza exhibits no tenderness. Right breast exhibits no inverted nipple, no mass, no nipple discharge, no skin change and no tenderness. Left breast exhibits no inverted nipple, no mass, no nipple discharge, no skin change and no tenderness. No breast swelling, tenderness, discharge or bleeding. Breasts are symmetrical.  Abdominal: Soft. Bowel sounds are normal. Terri Mendoza exhibits no distension and no mass. There is no tenderness. There is no rebound and no guarding.  Musculoskeletal: Normal range of motion. Terri Mendoza exhibits no edema or tenderness.  Lymphadenopathy:    Terri Mendoza has no cervical adenopathy.  Neurological: Terri Mendoza is alert and oriented to person, place, and time. Terri Mendoza has normal reflexes. No cranial nerve deficit.  Skin: Skin is warm and dry. No rash noted. Terri Mendoza is not diaphoretic. No erythema.  Psychiatric: Terri Mendoza has a normal mood and affect. Terri Mendoza behavior is normal. Judgment and thought content normal.  Nursing note and vitals reviewed.  BP (!) 111/58 (BP Location: Right Arm, Cuff Size: Normal)   Pulse 62   Temp 98.2 F (36.8 C) (Oral)   Resp 16   Ht 5\' 5"  (1.651 m)   Wt 161 lb (73 kg)   SpO2 99%   BMI 26.79 kg/m  Wt Readings from Last 3 Encounters:  06/04/18 161 lb (73 kg)  12/27/17 161 lb (73 kg)  03/22/17 163 lb (73.9 kg)     Lab Results  Component Value Date   WBC 6.3 12/27/2017   HGB 14.4 12/27/2017   HCT 42.9 12/27/2017   PLT 201.0 12/27/2017   GLUCOSE 131 (H) 12/27/2017   CHOL 190 04/18/2017   TRIG 65.0 04/18/2017   HDL 70.00 04/18/2017   LDLCALC 107 (H) 04/18/2017   ALT 16 12/27/2017   AST 23 12/27/2017   NA 139 12/27/2017   K 4.9 12/27/2017   CL 103 12/27/2017   CREATININE 0.57 12/27/2017   BUN 26 (H) 12/27/2017   CO2 28 12/27/2017   TSH 2.18 12/27/2017   INR 1.11 09/01/2014   HGBA1C 5.9 (H) 08/31/2014    Mm 3d Screen Breast  Bilateral  Result Date: 05/07/2018 CLINICAL DATA:  Screening. EXAM: DIGITAL SCREENING BILATERAL MAMMOGRAM WITH TOMO AND CAD COMPARISON:  Previous exam(s). ACR Breast Density Category b: There are scattered areas of fibroglandular density. FINDINGS: There are no findings suspicious for malignancy. Images were processed with CAD. IMPRESSION: No mammographic evidence of malignancy. A result letter of this screening mammogram will be mailed directly to the patient. RECOMMENDATION: Screening mammogram in one year. (Code:SM-B-01Y) BI-RADS CATEGORY  1: Negative. Electronically Signed   By: Lajean Manes M.D.   On: 05/07/2018 14:27     Assessment & Plan:  Plan  I have discontinued Caniya Tagle. Losey's fluticasone and levocetirizine. I am also having Terri Mendoza maintain Terri Mendoza fish oil-omega-3 fatty acids, OSTEO BI-FLEX ADV TRIPLE ST, vitamin C, diphenhydramine-acetaminophen, and EPINEPHrine.  No orders of the defined types were placed in this encounter.   Problem List Items Addressed This Visit      Unprioritized   Benign paroxysmal positional vertigo    epley manuver given  to pt to do at home when it occurs again rto prn       Hyperglycemia    Check labs today      Relevant Orders   Hemoglobin A1c   Comprehensive metabolic panel   CBC with Differential/Platelet   Lipid panel   Preventative health care - Primary    ghm utd Check labs  See avs        Other Visit Diagnoses    Hyperlipidemia, unspecified hyperlipidemia type       Relevant Orders   Hemoglobin A1c   Comprehensive metabolic panel   CBC with Differential/Platelet   Lipid panel      Follow-up: Return in about 1 year (around 06/05/2019) for annual exam, fasting.  [name], DO

## 2018-06-04 NOTE — Assessment & Plan Note (Signed)
ghm utd Check labs  See avs  

## 2018-06-04 NOTE — Assessment & Plan Note (Signed)
epley manuver given to pt to do at home when it occurs again rto prn

## 2018-06-04 NOTE — Patient Instructions (Signed)
Preventive Care 65 Years and Older, Female Preventive care refers to lifestyle choices and visits with your health care provider that can promote health and wellness. What does preventive care include?  A yearly physical exam. This is also called an annual well check.  Dental exams once or twice a year.  Routine eye exams. Ask your health care provider how often you should have your eyes checked.  Personal lifestyle choices, including: ? Daily care of your teeth and gums. ? Regular physical activity. ? Eating a healthy diet. ? Avoiding tobacco and drug use. ? Limiting alcohol use. ? Practicing safe sex. ? Taking low-dose aspirin every day. ? Taking vitamin and mineral supplements as recommended by your health care provider. What happens during an annual well check? The services and screenings done by your health care provider during your annual well check will depend on your age, overall health, lifestyle risk factors, and family history of disease. Counseling Your health care provider may ask you questions about your:  Alcohol use.  Tobacco use.  Drug use.  Emotional well-being.  Home and relationship well-being.  Sexual activity.  Eating habits.  History of falls.  Memory and ability to understand (cognition).  Work and work environment.  Reproductive health.  Screening You may have the following tests or measurements:  Height, weight, and BMI.  Blood pressure.  Lipid and cholesterol levels. These may be checked every 5 years, or more frequently if you are over 50 years old.  Skin check.  Lung cancer screening. You may have this screening every year starting at age 55 if you have a 30-pack-year history of smoking and currently smoke or have quit within the past 15 years.  Fecal occult blood test (FOBT) of the stool. You may have this test every year starting at age 50.  Flexible sigmoidoscopy or colonoscopy. You may have a sigmoidoscopy every 5 years or  a colonoscopy every 10 years starting at age 50.  Hepatitis C blood test.  Hepatitis B blood test.  Sexually transmitted disease (STD) testing.  Diabetes screening. This is done by checking your blood sugar (glucose) after you have not eaten for a while (fasting). You may have this done every 1-3 years.  Bone density scan. This is done to screen for osteoporosis. You may have this done starting at age 65.  Mammogram. This may be done every 1-2 years. Talk to your health care provider about how often you should have regular mammograms.  Talk with your health care provider about your test results, treatment options, and if necessary, the need for more tests. Vaccines Your health care provider may recommend certain vaccines, such as:  Influenza vaccine. This is recommended every year.  Tetanus, diphtheria, and acellular pertussis (Tdap, Td) vaccine. You may need a Td booster every 10 years.  Varicella vaccine. You may need this if you have not been vaccinated.  Zoster vaccine. You may need this after age 60.  Measles, mumps, and rubella (MMR) vaccine. You may need at least one dose of MMR if you were born in 1957 or later. You may also need a second dose.  Pneumococcal 13-valent conjugate (PCV13) vaccine. One dose is recommended after age 65.  Pneumococcal polysaccharide (PPSV23) vaccine. One dose is recommended after age 65.  Meningococcal vaccine. You may need this if you have certain conditions.  Hepatitis A vaccine. You may need this if you have certain conditions or if you travel or work in places where you may be exposed to hepatitis   A.  Hepatitis B vaccine. You may need this if you have certain conditions or if you travel or work in places where you may be exposed to hepatitis B.  Haemophilus influenzae type b (Hib) vaccine. You may need this if you have certain conditions.  Talk to your health care provider about which screenings and vaccines you need and how often you  need them. This information is not intended to replace advice given to you by your health care provider. Make sure you discuss any questions you have with your health care provider. Document Released: 10/15/2015 Document Revised: 06/07/2016 Document Reviewed: 07/20/2015 Elsevier Interactive Patient Education  2018 [employer].    How to Perform the Epley Maneuver The Epley maneuver is an exercise that relieves symptoms of vertigo. Vertigo is the feeling that you or your surroundings are moving when they are not. When you feel vertigo, you may feel like the room is spinning and have trouble walking. Dizziness is a little different than vertigo. When you are dizzy, you may feel unsteady or light-headed. You can do this maneuver at home whenever you have symptoms of vertigo. You can do it up to 3 times a day until your symptoms go away. Even though the Epley maneuver may relieve your vertigo for a few weeks, it is possible that your symptoms will return. This maneuver relieves vertigo, but it does not relieve dizziness. What are the risks? If it is done correctly, the Epley maneuver is considered safe. Sometimes it can lead to dizziness or nausea that goes away after a short time. If you develop other symptoms, such as changes in vision, weakness, or numbness, stop doing the maneuver and call your health care provider. How to perform the Epley maneuver 1. Sit on the edge of a bed or table with your back straight and your legs extended or hanging over the edge of the bed or table. 2. Turn your head halfway toward the affected ear or side. 3. Lie backward quickly with your head turned until you are lying flat on your back. You may want to position a pillow under your shoulders. 4. Hold this position for 30 seconds. You may experience an attack of vertigo. This is normal. 5. Turn your head to the opposite direction until your unaffected ear is facing the floor. 6. Hold this position for 30 seconds.  You may experience an attack of vertigo. This is normal. Hold this position until the vertigo stops. 7. Turn your whole body to the same side as your head. Hold for another 30 seconds. 8. Sit back up. You can repeat this exercise up to 3 times a day. Follow these instructions at home:  After doing the Epley maneuver, you can return to your normal activities.  Ask your health care provider if there is anything you should do at home to prevent vertigo. He or she may recommend that you: ? Keep your head raised (elevated) with two or more pillows while you sleep. ? Do not sleep on the side of your affected ear. ? Get up slowly from bed. ? Avoid sudden movements during the day. ? Avoid extreme head movement, like looking up or bending over. Contact a health care provider if:  Your vertigo gets worse.  You have other symptoms, including: ? Nausea. ? Vomiting. ? Headache. Get help right away if:  You have vision changes.  You have a severe or worsening headache or neck pain.  You cannot stop vomiting.  You have new numbness  or weakness in any part of your body. Summary  Vertigo is the feeling that you or your surroundings are moving when they are not.  The Epley maneuver is an exercise that relieves symptoms of vertigo.  If the Epley maneuver is done correctly, it is considered safe. You can do it up to 3 times a day. This information is not intended to replace advice given to you by your health care provider. Make sure you discuss any questions you have with your health care provider. Document Released: 09/23/2013 Document Revised: 08/08/2016 Document Reviewed: 08/08/2016 Elsevier Interactive Patient Education  2017 [employer].

## 2018-06-04 NOTE — Assessment & Plan Note (Signed)
Check labs today.

## 2018-06-05 LAB — CBC WITH DIFFERENTIAL/PLATELET
BASOS PCT: 1 % (ref 0.0–3.0)
Basophils Absolute: 0 10*3/uL (ref 0.0–0.1)
EOS PCT: 1.6 % (ref 0.0–5.0)
Eosinophils Absolute: 0.1 10*3/uL (ref 0.0–0.7)
HCT: 41.1 % (ref 36.0–46.0)
HEMOGLOBIN: 13.9 g/dL (ref 12.0–15.0)
LYMPHS ABS: 1.2 10*3/uL (ref 0.7–4.0)
Lymphocytes Relative: 27.7 % (ref 12.0–46.0)
MCHC: 33.9 g/dL (ref 30.0–36.0)
MCV: 93 fl (ref 78.0–100.0)
MONO ABS: 0.4 10*3/uL (ref 0.1–1.0)
Monocytes Relative: 8.6 % (ref 3.0–12.0)
NEUTROS PCT: 61.1 % (ref 43.0–77.0)
Neutro Abs: 2.6 10*3/uL (ref 1.4–7.7)
PLATELETS: 185 10*3/uL (ref 150.0–400.0)
RBC: 4.42 Mil/uL (ref 3.87–5.11)
RDW: 13.9 % (ref 11.5–15.5)
WBC: 4.3 10*3/uL (ref 4.0–10.5)

## 2018-06-05 LAB — COMPREHENSIVE METABOLIC PANEL
ALBUMIN: 4.2 g/dL (ref 3.5–5.2)
ALT: 13 U/L (ref 0–35)
AST: 20 U/L (ref 0–37)
Alkaline Phosphatase: 57 U/L (ref 39–117)
BUN: 19 mg/dL (ref 6–23)
CHLORIDE: 103 meq/L (ref 96–112)
CO2: 32 mEq/L (ref 19–32)
Calcium: 9.7 mg/dL (ref 8.4–10.5)
Creatinine, Ser: 0.63 mg/dL (ref 0.40–1.20)
GFR: 99.31 mL/min (ref >60.00)
GLUCOSE: 119 mg/dL — AB (ref 70–99)
POTASSIUM: 4 meq/L (ref 3.5–5.1)
SODIUM: 141 meq/L (ref 135–145)
TOTAL PROTEIN: 6.4 g/dL (ref 6.0–8.3)
Total Bilirubin: 0.4 mg/dL (ref 0.2–1.2)

## 2018-06-05 LAB — LIPID PANEL
CHOLESTEROL: 172 mg/dL (ref 0–200)
HDL: 58.6 mg/dL (ref >39.00)
LDL Cholesterol: 93 mg/dL (ref 0–99)
NonHDL: 113.21
TRIGLYCERIDES: 99 mg/dL (ref 0.0–149.0)
Total CHOL/HDL Ratio: 3
VLDL: 19.8 mg/dL (ref 0.0–40.0)

## 2018-06-05 LAB — HEMOGLOBIN A1C: Hgb A1c MFr Bld: 5.8 % (ref 4.6–6.5)

## 2018-06-07 ENCOUNTER — Encounter: Payer: Self-pay | Admitting: *Deleted

## 2018-07-10 ENCOUNTER — Other Ambulatory Visit: Payer: Self-pay | Admitting: Family Medicine

## 2018-07-10 DIAGNOSIS — J302 Other seasonal allergic rhinitis: Secondary | ICD-10-CM

## 2018-09-19 ENCOUNTER — Ambulatory Visit: Payer: Self-pay

## 2018-09-19 NOTE — Telephone Encounter (Signed)
Pt called stating that she has had periods of dizziness and nausea that come and go since Tuesday 09/17/18. She states that Tuesday she has two migraine headaches. Last one was severe.  Since the headaches she has had periods of dizziness and nausea.  She has not vomited.  She has played tennis without symptoms.  She is unable to check her BP but HR is 50 per her fit bit as we are speaking.  She has been eating and drinking but during the dizziness can not. She states her arms feal real weak. She denies numbness and weakness on one side of the body.  She has not had cold symptoms.  She has been Dx with vertigo in the past.  Appointment scheduled per protocol. Care advice read to patient. Patient verbalized understanding of all instructions. Reason for Disposition . [1] MODERATE dizziness (e.g., interferes with normal activities) AND [2] has NOT been evaluated by physician for this  (Exception: dizziness caused by heat exposure, sudden standing, or poor fluid intake)  Answer Assessment - Initial Assessment Questions 1. DESCRIPTION: "Describe your dizziness."     lightheased 2. LIGHTHEADED: "Do you feel lightheaded?" (e.g., somewhat faint, woozy, weak upon standing)     Woozy and nausea 3. VERTIGO: "Do you feel like either you or the room is spinning or tilting?" (i.e. vertigo)     no 4. SEVERITY: "How bad is it?"  "Do you feel like you are going to faint?" "Can you stand and walk?"   - MILD - walking normally   - MODERATE - interferes with normal activities (e.g., work, school)    - SEVERE - unable to stand, requires support to walk, feels like passing out now.      moderate 5. ONSET:  "When did the dizziness begin?"     Tuesday Started with migraines 2 in one day then teriffic HA 6. AGGRAVATING FACTORS: "Does anything make it worse?" (e.g., standing, change in head position)     standing 7. HEART RATE: "Can you tell me your heart rate?" "How many beats in 15 seconds?"  (Note: not all patients can  do this)       50 8. CAUSE: "What do you think is causing the dizziness?"     Problems with vertigo 9. RECURRENT SYMPTOM: "Have you had dizziness before?" If so, ask: "When was the last time?" "What happened that time?"     no 10. OTHER SYMPTOMS: "Do you have any other symptoms?" (e.g., fever, chest pain, vomiting, diarrhea, bleeding)       Nausea no vomiting weakness 11. PREGNANCY: "Is there any chance you are pregnant?" "When was your last menstrual period?"       N/A  Protocols used: DIZZINESS [facility]

## 2018-09-20 ENCOUNTER — Ambulatory Visit (HOSPITAL_BASED_OUTPATIENT_CLINIC_OR_DEPARTMENT_OTHER)
Admission: RE | Admit: 2018-09-20 | Discharge: 2018-09-20 | Disposition: A | Discharge status: Home / Self Care | Payer: Medicare HMO | Source: Ambulatory Visit | Attending: Family Medicine | Admitting: Family Medicine

## 2018-09-20 ENCOUNTER — Encounter: Payer: Self-pay | Admitting: Family Medicine

## 2018-09-20 ENCOUNTER — Ambulatory Visit (INDEPENDENT_AMBULATORY_CARE_PROVIDER_SITE_OTHER): Payer: Medicare HMO | Admitting: Family Medicine

## 2018-09-20 VITALS — BP 120/70 | HR 65 | Temp 98.4°F | Resp 16 | Ht 65.5 in | Wt 163.2 lb

## 2018-09-20 DIAGNOSIS — R51 Headache: Secondary | ICD-10-CM | POA: Diagnosis not present

## 2018-09-20 DIAGNOSIS — G43119 Migraine with aura, intractable, without status migrainosus: Secondary | ICD-10-CM

## 2018-09-20 DIAGNOSIS — R011 Cardiac murmur, unspecified: Secondary | ICD-10-CM | POA: Diagnosis not present

## 2018-09-20 DIAGNOSIS — R519 Headache, unspecified: Secondary | ICD-10-CM

## 2018-09-20 DIAGNOSIS — R42 Dizziness and giddiness: Secondary | ICD-10-CM

## 2018-09-20 LAB — SEDIMENTATION RATE: Sed Rate: 12 mm/hr (ref 0–30)

## 2018-09-20 LAB — CBC WITH DIFFERENTIAL/PLATELET
BASOS ABS: 0 10*3/uL (ref 0.0–0.1)
Basophils Relative: 0.4 % (ref 0.0–3.0)
EOS PCT: 0.6 % (ref 0.0–5.0)
Eosinophils Absolute: 0 10*3/uL (ref 0.0–0.7)
HCT: 45.8 % (ref 36.0–46.0)
HEMOGLOBIN: 15.3 g/dL — AB (ref 12.0–15.0)
Lymphocytes Relative: 15.8 % (ref 12.0–46.0)
Lymphs Abs: 1 10*3/uL (ref 0.7–4.0)
MCHC: 33.5 g/dL (ref 30.0–36.0)
MCV: 93 fl (ref 78.0–100.0)
MONO ABS: 0.6 10*3/uL (ref 0.1–1.0)
Monocytes Relative: 9.1 % (ref 3.0–12.0)
Neutro Abs: 4.8 10*3/uL (ref 1.4–7.7)
Neutrophils Relative %: 74.1 % (ref 43.0–77.0)
Platelets: 196 10*3/uL (ref 150.0–400.0)
RBC: 4.93 Mil/uL (ref 3.87–5.11)
RDW: 13.5 % (ref 11.5–15.5)
WBC: 6.5 10*3/uL (ref 4.0–10.5)

## 2018-09-20 LAB — COMPREHENSIVE METABOLIC PANEL
ALBUMIN: 4.6 g/dL (ref 3.5–5.2)
ALK PHOS: 68 U/L (ref 39–117)
ALT: 12 U/L (ref 0–35)
AST: 18 U/L (ref 0–37)
BILIRUBIN TOTAL: 0.5 mg/dL (ref 0.2–1.2)
BUN: 16 mg/dL (ref 6–23)
CO2: 29 mEq/L (ref 19–32)
CREATININE: 0.58 mg/dL (ref 0.40–1.20)
Calcium: 10.5 mg/dL (ref 8.4–10.5)
Chloride: 103 mEq/L (ref 96–112)
GFR: 109.17 mL/min (ref >60.00)
Glucose, Bld: 100 mg/dL — ABNORMAL HIGH (ref 70–99)
Potassium: 5.1 mEq/L (ref 3.5–5.1)
Sodium: 142 mEq/L (ref 135–145)
TOTAL PROTEIN: 7 g/dL (ref 6.0–8.3)

## 2018-09-20 LAB — VITAMIN B12: VITAMIN B 12: 546 pg/mL (ref 211–911)

## 2018-09-20 LAB — TSH: TSH: 2.56 u[IU]/mL (ref 0.35–4.50)

## 2018-09-20 MED ORDER — MECLIZINE HCL 25 MG PO TABS: 25.0000 mg | ORAL_TABLET | Freq: Three times a day (TID) | ORAL | 0 refills | Status: DC | PRN

## 2018-09-20 NOTE — Patient Instructions (Signed)
How to Perform the Epley Maneuver  The Epley maneuver is an exercise that relieves symptoms of vertigo. Vertigo is the feeling that you or your surroundings are moving when they are not. When you feel vertigo, you may feel like the room is spinning and have trouble walking. Dizziness is a little different than vertigo. When you are dizzy, you may feel unsteady or light-headed.  You can do this maneuver at home whenever you have symptoms of vertigo. You can do it up to 3 times a day until your symptoms go away.  Even though the Epley maneuver may relieve your vertigo for a few weeks, it is possible that your symptoms will return. This maneuver relieves vertigo, but it does not relieve dizziness.  What are the risks?  If it is done correctly, the Epley maneuver is considered safe. Sometimes it can lead to dizziness or nausea that goes away after a short time. If you develop other symptoms, such as changes in vision, weakness, or numbness, stop doing the maneuver and call your health care provider.  How to perform the Epley maneuver  1. Sit on the edge of a bed or table with your back straight and your legs extended or hanging over the edge of the bed or table.  2. Turn your head halfway toward the affected ear or side.  3. Lie backward quickly with your head turned until you are lying flat on your back. You may want to position a pillow under your shoulders.  4. Hold this position for 30 seconds. You may experience an attack of vertigo. This is normal.  5. Turn your head to the opposite direction until your unaffected ear is facing the floor.  6. Hold this position for 30 seconds. You may experience an attack of vertigo. This is normal. Hold this position until the vertigo stops.  7. Turn your whole body to the same side as your head. Hold for another 30 seconds.  8. Sit back up.  You can repeat this exercise up to 3 times a day.  Follow these instructions at home:   After doing the Epley maneuver, you can return to  your normal activities.   Ask your health care provider if there is anything you should do at home to prevent vertigo. He or she may recommend that you:  ? Keep your head raised (elevated) with two or more pillows while you sleep.  ? Do not sleep on the side of your affected ear.  ? Get up slowly from bed.  ? Avoid sudden movements during the day.  ? Avoid extreme head movement, like looking up or bending over.  Contact a health care provider if:   Your vertigo gets worse.   You have other symptoms, including:  ? Nausea.  ? Vomiting.  ? Headache.  Get help right away if:   You have vision changes.   You have a severe or worsening headache or neck pain.   You cannot stop vomiting.   You have new numbness or weakness in any part of your body.  Summary   Vertigo is the feeling that you or your surroundings are moving when they are not.   The Epley maneuver is an exercise that relieves symptoms of vertigo.   If the Epley maneuver is done correctly, it is considered safe. You can do it up to 3 times a day.  This information is not intended to replace advice given to you by your health care provider. Make sure   you discuss any questions you have with your health care provider.  Document Released: 09/23/2013 Document Revised: 08/08/2016 Document Reviewed: 08/08/2016  Elsevier Interactive Patient Education  2019 [employer]  \

## 2018-09-20 NOTE — Assessment & Plan Note (Signed)
Recheck echo

## 2018-09-20 NOTE — Assessment & Plan Note (Signed)
Meclizine prn Check labs If symptoms worsen-- go to ER Consider Neuro

## 2018-09-20 NOTE — Progress Notes (Signed)
Patient ID: SUMAYAH BEARSE, female    DOB: Jul 13, 1946  Age: 72 y.o. MRN: [id number]    Subjective:  Subjective  HPI NARIYAH OSIAS presents for dizziness and nausea since Tuesday.  Tuesday she had a migraine -- she took 2 excedrin migraine and it went away.  She played tennis and later in the day she had her aura again and took 2 excedrin migraine and the migraine was the worst ever for her =--- it went away after an hour   The pain this time was on the left side of her head and this was different .     The next day she woke up fine and exercised and after lunch she got weak , shaky and it lasted most of the afternoon.  She was not able to eat dinner due to nausea.  She felt bad all night.  Thursday she felt fine and by 9am she did not feel good again--- for most of the afternoon.  Today she woke up ---just not feeling 100%   At times she feels sob.  No chest pain.    Her husband is with her.   Review of Systems  Constitutional: Negative for activity change, appetite change, chills, fatigue, fever and unexpected weight change.  HENT: Negative for congestion and hearing loss.   Eyes: Negative for discharge.  Respiratory: Negative for cough and shortness of breath.   Cardiovascular: Negative for chest pain, palpitations and leg swelling.  Gastrointestinal: Negative for abdominal pain, blood in stool, constipation, diarrhea, nausea and vomiting.  Genitourinary: Negative for dysuria, frequency, hematuria and urgency.  Musculoskeletal: Negative for back pain and myalgias.  Skin: Negative for rash.  Allergic/Immunologic: Negative for environmental allergies.  Neurological: Positive for dizziness and weakness. Negative for headaches.  Hematological: Does not bruise/bleed easily.  Psychiatric/Behavioral: Negative for behavioral problems, dysphoric mood and suicidal ideas. The patient is not nervous/anxious.     History Past Medical History:  Diagnosis Date  . Allergy   . Atypical chest  pain 08/31/2014   persistent CP for 7 days, negative stress echo with EF 65% on 09/01/2014  . Heart murmur   . Migraines   . Osteopenia   . Pulmonary nodule 09/02/2014   70mm LUL pulmonary nodule seen on CT    She has a past surgical history that includes Colonoscopy.   Her family history includes CAD in her father; Cancer (age of onset: 16) in her mother; Colon cancer (age of onset: 78) in her mother; Coronary artery disease in her unknown relative; Factor V Leiden deficiency in her daughter; Heart disease (age of onset: 28) in her father; Heart disease (age of onset: 20) in her mother; Heart failure in her unknown relative.She reports that she has never smoked. She has never used smokeless tobacco. She reports current alcohol use of about 4.0 standard drinks of alcohol per week. She reports that she does not use drugs.  Current Outpatient Medications on File Prior to Visit  Medication Sig Dispense Refill  . Ascorbic Acid (VITAMIN C) 1000 MG tablet Take 1,000 mg by mouth daily.     . diphenhydramine-acetaminophen (TYLENOL PM) 25-500 MG TABS Take 0.5 tablets by mouth at bedtime as needed.     Marland Kitchen EPINEPHrine (ADRENACLICK) 0.3 NF/6.2 mL IJ SOAJ injection Inject 0.3 mLs (0.3 mg total) into the muscle once. 2 Device 0  . Misc Natural Products (OSTEO BI-FLEX ADV TRIPLE ST) TABS Take 1 tablet by mouth daily.      No current facility-administered  medications on file prior to visit.      Objective:  Objective  Physical Exam Vitals signs and nursing note reviewed.  Constitutional:      Appearance: She is well-developed.  HENT:     Head: Normocephalic and atraumatic.  Eyes:     Conjunctiva/sclera: Conjunctivae normal.  Neck:     Musculoskeletal: Normal range of motion and neck supple.     Thyroid: No thyromegaly.     Vascular: No carotid bruit or JVD.  Cardiovascular:     Rate and Rhythm: Normal rate and regular rhythm.     Heart sounds: Normal heart sounds. No murmur.  Pulmonary:      Effort: Pulmonary effort is normal. No respiratory distress.     Breath sounds: Normal breath sounds. No wheezing or rales.  Chest:     Chest wall: No tenderness.  Neurological:     Mental Status: She is alert and oriented to person, place, and time.     Cranial Nerves: No cranial nerve deficit.     Sensory: No sensory deficit.     Motor: No weakness.     Coordination: Coordination normal.     Gait: Gait normal.     Deep Tendon Reflexes: Reflexes normal.  Psychiatric:        Mood and Affect: Mood normal.    BP 120/70 (BP Location: Left Arm, Cuff Size: Normal)   Pulse 65   Temp 98.4 F (36.9 C) (Oral)   Resp 16   Ht 5' 5.5" (1.664 m)   Wt 163 lb 3.2 oz (74 kg)   SpO2 98%   BMI 26.74 kg/m  Wt Readings from Last 3 Encounters:  09/20/18 163 lb 3.2 oz (74 kg)  06/04/18 161 lb (73 kg)  12/27/17 161 lb (73 kg)     Lab Results  Component Value Date   WBC 4.3 06/04/2018   HGB 13.9 06/04/2018   HCT 41.1 06/04/2018   PLT 185.0 06/04/2018   GLUCOSE 119 (H) 06/04/2018   CHOL 172 06/04/2018   TRIG 99.0 06/04/2018   HDL 58.60 06/04/2018   LDLCALC 93 06/04/2018   ALT 13 06/04/2018   AST 20 06/04/2018   NA 141 06/04/2018   K 4.0 06/04/2018   CL 103 06/04/2018   CREATININE 0.63 06/04/2018   BUN 19 06/04/2018   CO2 32 06/04/2018   TSH 2.18 12/27/2017   INR 1.11 09/01/2014   HGBA1C 5.8 06/04/2018    Mm 3d Screen Breast Bilateral  Result Date: 05/07/2018 CLINICAL DATA:  Screening. EXAM: DIGITAL SCREENING BILATERAL MAMMOGRAM WITH TOMO AND CAD COMPARISON:  Previous exam(s). ACR Breast Density Category b: There are scattered areas of fibroglandular density. FINDINGS: There are no findings suspicious for malignancy. Images were processed with CAD. IMPRESSION: No mammographic evidence of malignancy. A result letter of this screening mammogram will be mailed directly to the patient. RECOMMENDATION: Screening mammogram in one year. (Code:SM-B-01Y) BI-RADS CATEGORY  1: Negative.  Electronically Signed   By: Lajean Manes M.D.   On: 05/07/2018 14:27     Assessment & Plan:  Plan  I have discontinued Gabrielle Wakeland. Bartell's fish oil-omega-3 fatty acids and fluticasone. I am also having her start on meclizine. Additionally, I am having her maintain her OSTEO BI-FLEX ADV TRIPLE ST, vitamin C, diphenhydramine-acetaminophen, and EPINEPHrine.  Meds ordered this encounter  Medications  . meclizine (ANTIVERT) 25 MG tablet    Sig: Take 1 tablet (25 mg total) by mouth 3 (three) times daily as needed for dizziness.  Dispense:  30 tablet    Refill:  0    Problem List Items Addressed This Visit      Unprioritized   Acute intractable headache   Relevant Orders   CT Head Wo Contrast (Completed)   Sedimentation rate   Dizzy - Primary    Meclizine prn Check labs If symptoms worsen-- go to ER Consider Neuro       Relevant Medications   meclizine (ANTIVERT) 25 MG tablet   Other Relevant Orders   EKG 12-Lead (Completed)   CT Head Wo Contrast (Completed)   TSH   CBC with Differential/Platelet   Comprehensive metabolic panel   Vitamin U76   Sedimentation rate   ECHOCARDIOGRAM COMPLETE   Migraine with aura    Recently had one different than usual and worst one ever cT head stat      Murmur    Recheck echo       Relevant Orders   ECHOCARDIOGRAM COMPLETE    if symptoms worsen call or go to ER  Follow-up: Return if symptoms worsen or fail to improve.  [name], DO

## 2018-09-20 NOTE — Assessment & Plan Note (Signed)
Recently had one different than usual and worst one ever cT head stat

## 2018-10-01 ENCOUNTER — Ambulatory Visit (HOSPITAL_BASED_OUTPATIENT_CLINIC_OR_DEPARTMENT_OTHER)
Admission: RE | Admit: 2018-10-01 | Discharge: 2018-10-01 | Disposition: A | Discharge status: Home / Self Care | Payer: Medicare HMO | Source: Ambulatory Visit | Attending: Family Medicine | Admitting: Family Medicine

## 2018-10-01 DIAGNOSIS — R011 Cardiac murmur, unspecified: Secondary | ICD-10-CM | POA: Diagnosis not present

## 2018-10-01 DIAGNOSIS — R42 Dizziness and giddiness: Secondary | ICD-10-CM | POA: Diagnosis not present

## 2018-10-01 NOTE — Progress Notes (Signed)
  Echocardiogram 2D Echocardiogram has been performed.  Terri Mendoza [name] 10/01/2018, 2:34 PM

## 2018-10-21 ENCOUNTER — Ambulatory Visit (INDEPENDENT_AMBULATORY_CARE_PROVIDER_SITE_OTHER): Payer: Medicare HMO | Admitting: Family Medicine

## 2018-10-21 ENCOUNTER — Encounter: Payer: Self-pay | Admitting: Family Medicine

## 2018-10-21 VITALS — BP 110/70 | HR 69 | Temp 98.2°F | Resp 16 | Ht 65.5 in | Wt 164.4 lb

## 2018-10-21 DIAGNOSIS — R05 Cough: Secondary | ICD-10-CM | POA: Diagnosis not present

## 2018-10-21 DIAGNOSIS — H1033 Unspecified acute conjunctivitis, bilateral: Secondary | ICD-10-CM

## 2018-10-21 DIAGNOSIS — J069 Acute upper respiratory infection, unspecified: Secondary | ICD-10-CM

## 2018-10-21 DIAGNOSIS — R062 Wheezing: Secondary | ICD-10-CM | POA: Diagnosis not present

## 2018-10-21 DIAGNOSIS — R059 Cough, unspecified: Secondary | ICD-10-CM

## 2018-10-21 MED ORDER — AZELASTINE HCL 0.1 % NA SOLN: 1.0000 | Freq: Two times a day (BID) | NASAL | 12 refills | Status: DC

## 2018-10-21 MED ORDER — METHYLPREDNISOLONE ACETATE 80 MG/ML IJ SUSP
80.0000 mg | Freq: Once | INTRAMUSCULAR | Status: AC
  Administered 2018-10-21: 80 mg via INTRAMUSCULAR

## 2018-10-21 MED ORDER — MOXIFLOXACIN HCL 0.5 % OP SOLN: 1.0000 [drp] | Freq: Three times a day (TID) | OPHTHALMIC | 0 refills | Status: AC

## 2018-10-21 MED ORDER — ALBUTEROL SULFATE (2.5 MG/3ML) 0.083% IN NEBU
2.5000 mg | INHALATION_SOLUTION | Freq: Once | RESPIRATORY_TRACT | Status: AC
  Administered 2018-10-21: 2.5 mg via RESPIRATORY_TRACT

## 2018-10-21 MED ORDER — BENZONATATE 100 MG PO CAPS: 200.0000 mg | ORAL_CAPSULE | Freq: Three times a day (TID) | ORAL | 0 refills | Status: DC | PRN

## 2018-10-21 MED ORDER — LORATADINE 10 MG PO TABS: 10.0000 mg | ORAL_TABLET | Freq: Every day | ORAL | 11 refills | Status: DC

## 2018-10-21 NOTE — Patient Instructions (Signed)
Bacterial Conjunctivitis, Adult  Bacterial conjunctivitis is an infection of your conjunctiva. This is the clear membrane that covers the white part of your eye and the inner part of your eyelid. This infection can make your eye:  · Red or pink.  · Itchy.  This condition spreads easily from person to person (is contagious) and from one eye to the other eye.  What are the causes?  · This condition is caused by germs (bacteria). You may get the infection if you come into close contact with:  ? A person who has the infection.  ? Items that have germs on them (are contaminated), such as face towels, contact lens solution, or eye makeup.  What increases the risk?  You are more likely to get this condition if you:  · Have contact with people who have the infection.  · Wear contact lenses.  · Have a sinus infection.  · Have had a recent eye injury or surgery.  · Have a weak body defense system (immune system).  · Have dry eyes.  What are the signs or symptoms?    · Thick, yellowish discharge from the eye.  · Tearing or watery eyes.  · Itchy eyes.  · Burning feeling in your eyes.  · Eye redness.  · Swollen eyelids.  · Blurred vision.  How is this treated?    · Antibiotic eye drops or ointment.  · Antibiotic medicine taken by mouth. This is used for infections that do not get better with drops or ointment or that last more than 10 days.  · Cool, wet cloths placed on the eyes.  · Artificial tears used 2-6 times a day.  Follow these instructions at home:  Medicines  · Take or apply your antibiotic medicine as told by your doctor. Do not stop taking or applying the antibiotic even if you start to feel better.  · Take or apply over-the-counter and prescription medicines only as told by your doctor.  · Do not touch your eyelid with the eye-drop bottle or the ointment tube.  Managing discomfort  · Wipe any fluid from your eye with a warm, wet washcloth or a cotton ball.  · Place a clean, cool, wet cloth on your eye. Do this for  10-20 minutes, 3-4 times per day.  General instructions  · Do not wear contacts until the infection is gone. Wear glasses until your doctor says it is okay to wear contacts again.  · Do not wear eye makeup until the infection is gone. Throw away old eye makeup.  · Change or wash your pillowcase every day.  · Do not share towels or washcloths.  · Wash your hands often with soap and water. Use paper towels to dry your hands.  · Do not touch or rub your eyes.  · Do not drive or use heavy machinery if your vision is blurred.  Contact a doctor if:  · You have a fever.  · You do not get better after 10 days.  Get help right away if:  · You have a fever and your symptoms get worse all of a sudden.  · You have very bad pain when you move your eye.  · Your face:  ? Hurts.  ? Is red.  ? Is swollen.  · You have sudden loss of vision.  Summary  · Bacterial conjunctivitis is an infection of your conjunctiva.  · This infection spreads easily from person to person.  · Wash your hands often   with soap and water. Use paper towels to dry your hands.  · Take or apply your antibiotic medicine as told by your doctor.  · Contact a doctor if you have a fever or you do not get better after 10 days.  This information is not intended to replace advice given to you by your health care provider. Make sure you discuss any questions you have with your health care provider.  Document Released: 06/27/2008 Document Revised: 04/24/2018 Document Reviewed: 04/24/2018  Elsevier Interactive Patient Education © 2019 [employer] Inc[employer]

## 2018-10-21 NOTE — Progress Notes (Signed)
Patient ID: Terri Mendoza, female    DOB: 26-Jul-1946  Age: 73 y.o. MRN: [id number]    Subjective:  Subjective  HPI Terri Mendoza presents for conjunctivitis and pnd x 2 weeks   No fever, no sinus pressure; headache  Review of Systems  Constitutional: Negative for appetite change, diaphoresis, fatigue and unexpected weight change.  HENT: Positive for congestion, postnasal drip, sneezing and sore throat.   Eyes: Positive for discharge, redness and itching. Negative for pain and visual disturbance.  Respiratory: Positive for cough. Negative for chest tightness, shortness of breath and wheezing.   Cardiovascular: Negative for chest pain, palpitations and leg swelling.  Endocrine: Negative for cold intolerance, heat intolerance, polydipsia, polyphagia and polyuria.  Genitourinary: Negative for difficulty urinating, dysuria and frequency.  Neurological: Negative for dizziness, light-headedness, numbness and headaches.    History Past Medical History:  Diagnosis Date  . Allergy   . Atypical chest pain 08/31/2014   persistent CP for 7 days, negative stress echo with EF 65% on 09/01/2014  . Heart murmur   . Migraines   . Osteopenia   . Pulmonary nodule 09/02/2014   30mm LUL pulmonary nodule seen on CT    She has a past surgical history that includes Colonoscopy.   Her family history includes CAD in her father; Cancer (age of onset: 21) in her mother; Colon cancer (age of onset: 78) in her mother; Coronary artery disease in her unknown relative; Factor V Leiden deficiency in her daughter; Heart disease (age of onset: 66) in her father; Heart disease (age of onset: 71) in her mother; Heart failure in her unknown relative.She reports that she has never smoked. She has never used smokeless tobacco. She reports current alcohol use of about 4.0 standard drinks of alcohol per week. She reports that she does not use drugs.  Current Outpatient Medications on File Prior to Visit  Medication  Sig Dispense Refill  . Ascorbic Acid (VITAMIN C) 1000 MG tablet Take 1,000 mg by mouth daily.     . diphenhydramine-acetaminophen (TYLENOL PM) 25-500 MG TABS Take 0.5 tablets by mouth at bedtime as needed.     Marland Kitchen EPINEPHrine (ADRENACLICK) 0.3 DG/3.8 mL IJ SOAJ injection Inject 0.3 mLs (0.3 mg total) into the muscle once. 2 Device 0  . meclizine (ANTIVERT) 25 MG tablet Take 1 tablet (25 mg total) by mouth 3 (three) times daily as needed for dizziness. 30 tablet 0  . Misc Natural Products (OSTEO BI-FLEX ADV TRIPLE ST) TABS Take 1 tablet by mouth daily.      No current facility-administered medications on file prior to visit.      Objective:  Objective  Physical Exam Vitals signs and nursing note reviewed.  Constitutional:      Appearance: She is well-developed.  HENT:     Right Ear: External ear normal.     Left Ear: External ear normal.     Nose: Congestion and rhinorrhea present.     Mouth/Throat:     Pharynx: Posterior oropharyngeal erythema present.     Comments: pnd  Eyes:     General:        Right eye: Discharge present.        Left eye: Discharge present.    Conjunctiva/sclera:     Right eye: Right conjunctiva is injected. No hemorrhage.    Left eye: Left conjunctiva is injected. No hemorrhage. Cardiovascular:     Rate and Rhythm: Normal rate and regular rhythm.     Heart sounds: Normal  heart sounds. No murmur.  Pulmonary:     Effort: Pulmonary effort is normal. No respiratory distress.     Breath sounds: Normal breath sounds. No wheezing or rales.  Chest:     Chest wall: No tenderness.  Lymphadenopathy:     Cervical: Cervical adenopathy present.  Neurological:     Mental Status: She is alert and oriented to person, place, and time.    BP 110/70 (BP Location: Right Arm, Cuff Size: Normal)   Pulse 69   Temp 98.2 F (36.8 C) (Oral)   Resp 16   Ht 5' 5.5" (1.664 m)   Wt 164 lb 6.4 oz (74.6 kg)   SpO2 97%   BMI 26.94 kg/m  Wt Readings from Last 3 Encounters:    10/21/18 164 lb 6.4 oz (74.6 kg)  09/20/18 163 lb 3.2 oz (74 kg)  06/04/18 161 lb (73 kg)     Lab Results  Component Value Date   WBC 6.5 09/20/2018   HGB 15.3 (H) 09/20/2018   HCT 45.8 09/20/2018   PLT 196.0 09/20/2018   GLUCOSE 100 (H) 09/20/2018   CHOL 172 06/04/2018   TRIG 99.0 06/04/2018   HDL 58.60 06/04/2018   LDLCALC 93 06/04/2018   ALT 12 09/20/2018   AST 18 09/20/2018   NA 142 09/20/2018   K 5.1 09/20/2018   CL 103 09/20/2018   CREATININE 0.58 09/20/2018   BUN 16 09/20/2018   CO2 29 09/20/2018   TSH 2.56 09/20/2018   INR 1.11 09/01/2014   HGBA1C 5.8 06/04/2018    No results found.   Assessment & Plan:  Plan  I am having Terri Mendoza start on moxifloxacin, loratadine, azelastine, and benzonatate. I am also having her maintain her OSTEO BI-FLEX ADV TRIPLE ST, vitamin C, diphenhydramine-acetaminophen, EPINEPHrine, and meclizine. We administered methylPREDNISolone acetate and albuterol.  Meds ordered this encounter  Medications  . methylPREDNISolone acetate (DEPO-MEDROL) injection 80 mg  . albuterol (PROVENTIL) (2.5 MG/3ML) 0.083% nebulizer solution 2.5 mg  . moxifloxacin (VIGAMOX) 0.5 % ophthalmic solution    Sig: Place 1 drop into both eyes 3 (three) times daily for 7 days.    Dispense:  3 mL    Refill:  0  . loratadine (CLARITIN) 10 MG tablet    Sig: Take 1 tablet (10 mg total) by mouth daily.    Dispense:  30 tablet    Refill:  11  . azelastine (ASTELIN) 0.1 % nasal spray    Sig: Place 1 spray into both nostrils 2 (two) times daily. Use in each nostril as directed    Dispense:  30 mL    Refill:  12  . benzonatate (TESSALON PERLES) 100 MG capsule    Sig: Take 2 capsules (200 mg total) by mouth 3 (three) times daily as needed for cough.    Dispense:  20 capsule    Refill:  0    Problem List Items Addressed This Visit    None    Visit Diagnoses    Cough    -  Primary   Relevant Medications   methylPREDNISolone acetate (DEPO-MEDROL)  injection 80 mg (Completed)   albuterol (PROVENTIL) (2.5 MG/3ML) 0.083% nebulizer solution 2.5 mg (Completed)   benzonatate (TESSALON PERLES) 100 MG capsule   Wheezing       Relevant Medications   methylPREDNISolone acetate (DEPO-MEDROL) injection 80 mg (Completed)   albuterol (PROVENTIL) (2.5 MG/3ML) 0.083% nebulizer solution 2.5 mg (Completed)   Acute bacterial conjunctivitis of both eyes  Relevant Medications   moxifloxacin (VIGAMOX) 0.5 % ophthalmic solution   Viral upper respiratory tract infection       Relevant Medications   loratadine (CLARITIN) 10 MG tablet   azelastine (ASTELIN) 0.1 % nasal spray    con't meds--- add abx eye drop   Follow-up: Return if symptoms worsen or fail to improve.  [name], DO

## 2018-10-25 ENCOUNTER — Encounter: Payer: Self-pay | Admitting: Family Medicine

## 2018-10-25 MED ORDER — AZITHROMYCIN 250 MG PO TABS: ORAL_TABLET | ORAL | 0 refills | Status: DC

## 2018-10-25 NOTE — Telephone Encounter (Signed)
Can call in z pack #1  As directed  Will need ov if no better after that

## 2018-11-22 DIAGNOSIS — L720 Epidermal cyst: Secondary | ICD-10-CM | POA: Diagnosis not present

## 2018-11-22 DIAGNOSIS — D225 Melanocytic nevi of trunk: Secondary | ICD-10-CM | POA: Diagnosis not present

## 2018-11-22 DIAGNOSIS — D2262 Melanocytic nevi of left upper limb, including shoulder: Secondary | ICD-10-CM | POA: Diagnosis not present

## 2018-11-22 DIAGNOSIS — L570 Actinic keratosis: Secondary | ICD-10-CM | POA: Diagnosis not present

## 2018-11-22 DIAGNOSIS — L821 Other seborrheic keratosis: Secondary | ICD-10-CM | POA: Diagnosis not present

## 2018-11-22 DIAGNOSIS — C4401 Basal cell carcinoma of skin of lip: Secondary | ICD-10-CM | POA: Diagnosis not present

## 2018-11-22 DIAGNOSIS — D2271 Melanocytic nevi of right lower limb, including hip: Secondary | ICD-10-CM | POA: Diagnosis not present

## 2018-11-22 DIAGNOSIS — D2261 Melanocytic nevi of right upper limb, including shoulder: Secondary | ICD-10-CM | POA: Diagnosis not present

## 2018-11-22 DIAGNOSIS — D2272 Melanocytic nevi of left lower limb, including hip: Secondary | ICD-10-CM | POA: Diagnosis not present

## 2019-02-12 DIAGNOSIS — C4401 Basal cell carcinoma of skin of lip: Secondary | ICD-10-CM | POA: Diagnosis not present

## 2019-02-12 DIAGNOSIS — Z85828 Personal history of other malignant neoplasm of skin: Secondary | ICD-10-CM | POA: Diagnosis not present

## 2019-02-14 HISTORY — PX: MOHS SURGERY: SUR867

## 2019-03-02 ENCOUNTER — Ambulatory Visit (HOSPITAL_COMMUNITY)
Admission: EM | Admit: 2019-03-02 | Discharge: 2019-03-02 | Disposition: A | Discharge status: Home / Self Care | Payer: Medicare HMO | Attending: Urgent Care | Admitting: Urgent Care

## 2019-03-02 ENCOUNTER — Encounter (HOSPITAL_COMMUNITY): Payer: Self-pay | Admitting: Emergency Medicine

## 2019-03-02 ENCOUNTER — Ambulatory Visit (INDEPENDENT_AMBULATORY_CARE_PROVIDER_SITE_OTHER): Payer: Medicare HMO

## 2019-03-02 ENCOUNTER — Other Ambulatory Visit: Payer: Self-pay

## 2019-03-02 DIAGNOSIS — M7989 Other specified soft tissue disorders: Secondary | ICD-10-CM | POA: Diagnosis not present

## 2019-03-02 DIAGNOSIS — M79644 Pain in right finger(s): Secondary | ICD-10-CM

## 2019-03-02 DIAGNOSIS — W230XXA Caught, crushed, jammed, or pinched between moving objects, initial encounter: Secondary | ICD-10-CM | POA: Diagnosis not present

## 2019-03-02 DIAGNOSIS — S62640A Nondisplaced fracture of proximal phalanx of right index finger, initial encounter for closed fracture: Secondary | ICD-10-CM | POA: Diagnosis not present

## 2019-03-02 DIAGNOSIS — S62610A Displaced fracture of proximal phalanx of right index finger, initial encounter for closed fracture: Secondary | ICD-10-CM | POA: Diagnosis not present

## 2019-03-02 MED ORDER — TRAMADOL-ACETAMINOPHEN 37.5-325 MG PO TABS: 1.0000 | ORAL_TABLET | Freq: Three times a day (TID) | ORAL | 0 refills | Status: DC | PRN

## 2019-03-02 NOTE — Discharge Instructions (Addendum)
You may take 500-650mg  Tylenol every 6 hours for pain and inflammation. For severe pain not controlled by Tylenol alone use Ultracet (Tylenol-tramadol) and then defer back to regular Tylenol once your pain is better.

## 2019-03-02 NOTE — ED Provider Notes (Signed)
MRN: [id number] DOB: [birth year]  Subjective:   Terri Mendoza is a 73 y.o. female presenting for crush injury of 2nd right finger. Patient was playing with her grandson and had finger crushed in between 2 brachi balls. She has since had significant swelling, severe pain. Has been icing, took APAP with some relief but still has severe pain.  She also is unable to bend her finger fully due to the pain.  No current facility-administered medications for this encounter.   Current Outpatient Medications:  .  Ascorbic Acid (VITAMIN C) 1000 MG tablet, Take 1,000 mg by mouth daily. , Disp: , Rfl:  .  azelastine (ASTELIN) 0.1 % nasal spray, Place 1 spray into both nostrils 2 (two) times daily. Use in each nostril as directed, Disp: 30 mL, Rfl: 12 .  diphenhydramine-acetaminophen (TYLENOL PM) 25-500 MG TABS, Take 0.5 tablets by mouth at bedtime as needed. , Disp: , Rfl:  .  EPINEPHrine (ADRENACLICK) 0.3 OI/7.1 mL IJ SOAJ injection, Inject 0.3 mLs (0.3 mg total) into the muscle once., Disp: 2 Device, Rfl: 0 .  loratadine (CLARITIN) 10 MG tablet, Take 1 tablet (10 mg total) by mouth daily., Disp: 30 tablet, Rfl: 11 .  meclizine (ANTIVERT) 25 MG tablet, Take 1 tablet (25 mg total) by mouth 3 (three) times daily as needed for dizziness., Disp: 30 tablet, Rfl: 0 .  Misc Natural Products (OSTEO BI-FLEX ADV TRIPLE ST) TABS, Take 1 tablet by mouth daily. , Disp: , Rfl:     Allergies  Allergen Reactions  . Iohexol Hives and Swelling  . Meloxicam Hives    Past Medical History:  Diagnosis Date  . Allergy   . Atypical chest pain 08/31/2014   persistent CP for 7 days, negative stress echo with EF 65% on 09/01/2014  . Heart murmur   . Migraines   . Osteopenia   . Pulmonary nodule 09/02/2014   68mm LUL pulmonary nodule seen on CT     Past Surgical History:  Procedure Laterality Date  . COLONOSCOPY      ROS Denies loss of sensation, discoloration, laceration.  Objective:   Vitals: BP 127/75 (BP  Location: Right Arm)   Pulse 72   Temp (!) 97.4 F (36.3 C) (Oral)   Resp 18   SpO2 97%   Physical Exam Constitutional:      General: She is not in acute distress.    Appearance: Normal appearance. She is well-developed. She is not ill-appearing.  HENT:     Head: Normocephalic and atraumatic.     Nose: Nose normal.     Mouth/Throat:     Mouth: Mucous membranes are moist.     Pharynx: Oropharynx is clear.  Eyes:     General: No scleral icterus.    Extraocular Movements: Extraocular movements intact.     Pupils: Pupils are equal, round, and reactive to light.  Cardiovascular:     Rate and Rhythm: Normal rate.  Pulmonary:     Effort: Pulmonary effort is normal.  Musculoskeletal:     Right hand: She exhibits decreased range of motion (Full flexion at the MCP of second finger), tenderness (Over area depicted), bony tenderness and swelling. She exhibits normal capillary refill and no laceration. Normal sensation noted. Normal strength noted.       Hands:  Skin:    General: Skin is warm and dry.  Neurological:     General: No focal deficit present.     Mental Status: She is alert and oriented to person,  place, and time.  Psychiatric:        Mood and Affect: Mood normal.        Behavior: Behavior normal.    Dg Finger Index Right  Result Date: 03/02/2019 CLINICAL DATA:  Blunt trauma EXAM: RIGHT INDEX FINGER 2+V COMPARISON:  None. FINDINGS: Oblique fracture through the midshaft proximal phalanx second digit. Dorsal angulation. Fracture does not enter the articular surfaces. IMPRESSION: Midshaft fracture proximal phalanx. Electronically Signed   By: Suzy Bouchard M.D.   On: 03/02/2019 15:29    Assessment and Plan :   Closed nondisplaced fracture of proximal phalanx of right index finger, initial encounter  Finger pain, right  Finger swelling  Case precepted with Dr. Joseph Art. Patient will be placed in radial gutter splint.  She refused hydrocodone for pain control.  She  was agreeable to using Ultracet as needed for severe pain.  Will schedule Tylenol otherwise.  Counseled that she needs to contact hand specialist ASAP. Counseled patient on potential for adverse effects with medications prescribed today, patient verbalized understanding. ER and return-to-clinic precautions discussed, patient verbalized understanding.    Jaynee Eagles, [place] 03/02/19 [id number]

## 2019-03-02 NOTE — Progress Notes (Signed)
Orthopedic Tech Progress Note Patient Details:  Terri Mendoza [birth year] [id number]  Ortho Devices Type of Ortho Device: Rad Gutter splint Ortho Device/Splint Location: right Ortho Device/Splint Interventions: Application   Post Interventions Patient Tolerated: Well Instructions Provided: Care of device   [name] 03/02/2019, 4:21 PM

## 2019-03-02 NOTE — ED Triage Notes (Signed)
Pt here after smashing 2nd digit on right hand between 2 brachi balls; swelling noted

## 2019-03-03 DIAGNOSIS — S62610A Displaced fracture of proximal phalanx of right index finger, initial encounter for closed fracture: Secondary | ICD-10-CM | POA: Diagnosis not present

## 2019-03-03 DIAGNOSIS — M79641 Pain in right hand: Secondary | ICD-10-CM | POA: Diagnosis not present

## 2019-03-07 DIAGNOSIS — S62610A Displaced fracture of proximal phalanx of right index finger, initial encounter for closed fracture: Secondary | ICD-10-CM | POA: Diagnosis not present

## 2019-03-07 DIAGNOSIS — X58XXXA Exposure to other specified factors, initial encounter: Secondary | ICD-10-CM | POA: Diagnosis not present

## 2019-03-08 HISTORY — PX: FINGER FRACTURE SURGERY: SHX638

## 2019-03-20 DIAGNOSIS — S62610D Displaced fracture of proximal phalanx of right index finger, subsequent encounter for fracture with routine healing: Secondary | ICD-10-CM | POA: Diagnosis not present

## 2019-03-20 DIAGNOSIS — M79641 Pain in right hand: Secondary | ICD-10-CM | POA: Diagnosis not present

## 2019-03-20 DIAGNOSIS — Z4789 Encounter for other orthopedic aftercare: Secondary | ICD-10-CM | POA: Diagnosis not present

## 2019-03-25 DIAGNOSIS — M79641 Pain in right hand: Secondary | ICD-10-CM | POA: Diagnosis not present

## 2019-03-31 DIAGNOSIS — M79641 Pain in right hand: Secondary | ICD-10-CM | POA: Diagnosis not present

## 2019-04-03 DIAGNOSIS — M79641 Pain in right hand: Secondary | ICD-10-CM | POA: Diagnosis not present

## 2019-04-03 DIAGNOSIS — S62610D Displaced fracture of proximal phalanx of right index finger, subsequent encounter for fracture with routine healing: Secondary | ICD-10-CM | POA: Diagnosis not present

## 2019-04-03 DIAGNOSIS — Z4789 Encounter for other orthopedic aftercare: Secondary | ICD-10-CM | POA: Diagnosis not present

## 2019-04-07 DIAGNOSIS — M79641 Pain in right hand: Secondary | ICD-10-CM | POA: Diagnosis not present

## 2019-04-14 DIAGNOSIS — M79641 Pain in right hand: Secondary | ICD-10-CM | POA: Diagnosis not present

## 2019-04-24 DIAGNOSIS — S62610D Displaced fracture of proximal phalanx of right index finger, subsequent encounter for fracture with routine healing: Secondary | ICD-10-CM | POA: Diagnosis not present

## 2019-04-24 DIAGNOSIS — Z4789 Encounter for other orthopedic aftercare: Secondary | ICD-10-CM | POA: Diagnosis not present

## 2019-04-24 DIAGNOSIS — M79641 Pain in right hand: Secondary | ICD-10-CM | POA: Diagnosis not present

## 2019-06-11 NOTE — Progress Notes (Signed)
Virtual Visit via Video Note  I connected with patient on 06/12/19 at  2:45 PM EDT by audio enabled telemedicine application and verified that I am speaking with the correct person using two identifiers.   THIS ENCOUNTER IS A VIRTUAL VISIT DUE TO COVID-19 - PATIENT WAS NOT SEEN IN THE OFFICE. PATIENT HAS CONSENTED TO VIRTUAL VISIT / TELEMEDICINE VISIT   Location of patient: home  Location of provider: office  I discussed the limitations of evaluation and management by telemedicine and the availability of in person appointments. The patient expressed understanding and agreed to proceed.   Subjective:   Terri Mendoza is a 73 y.o. female who presents for Medicare Annual (Subsequent) preventive examination.  Review of Systems:  Cardiac Risk Factors include: advanced age (>44men, >109 women) Home Safety/Smoke Alarms: Feels safe in home. Smoke alarms in place.    Female:   Mammo- 05/07/18      Dexa scan-  04/26/18  CCS-04/30/13. Recall 10 yrs     Objective:     Vitals: BP 107/62 Comment: pt reported  There is no height or weight on file to calculate BMI.  Advanced Directives 06/12/2019 06/04/2018 01/31/2016 05/10/2015 08/31/2014  Does Patient Have a Medical Advance Directive? Yes Yes Yes Yes No  Type of Paramedic of [place];Living will Living will [place];Living will [place];Living will -  Does patient want to make changes to medical advance directive? No - Patient declined - No - Patient declined - -  Copy of [place] in Chart? No - copy requested - No - copy requested - -  Would patient like information on creating a medical advance directive? - - - - No - patient declined information    Tobacco Social History   Tobacco Use  Smoking Status Never Smoker  Smokeless Tobacco Never Used     Counseling given: Not Answered   Clinical Intake:  Pain : No/denies pain    Past Medical History:   Diagnosis Date  . Allergy   . Atypical chest pain 08/31/2014   persistent CP for 7 days, negative stress echo with EF 65% on 09/01/2014  . Heart murmur   . Migraines   . Osteopenia   . Pulmonary nodule 09/02/2014   43mm LUL pulmonary nodule seen on CT   Past Surgical History:  Procedure Laterality Date  . COLONOSCOPY     Family History  Problem Relation Age of Onset  . Heart disease Mother 56       chf,  valve repair  . Cancer Mother 35       colon  . Colon cancer Mother 45  . Heart disease Father 77       chf  . CAD Father   . Factor V Leiden deficiency Daughter   . Heart failure Other        CHF  . Coronary artery disease Other    Social History   Socioeconomic History  . Marital status: Married    Spouse name: Not on file  . Number of children: Not on file  . Years of education: Not on file  . Highest education level: Not on file  Occupational History  . Not on file  Social Needs  . Financial resource strain: Not on file  . Food insecurity    Worry: Not on file    Inability: Not on file  . Transportation needs    Medical: Not on file  Non-medical: Not on file  Tobacco Use  . Smoking status: Never Smoker  . Smokeless tobacco: Never Used  Substance and Sexual Activity  . Alcohol use: Yes    Alcohol/week: 4.0 standard drinks    Types: 4 Glasses of wine per week  . Drug use: No  . Sexual activity: Yes    Partners: Male  Lifestyle  . Physical activity    Days per week: Not on file    Minutes per session: Not on file  . Stress: Not on file  Relationships  . Social Herbalist on phone: Not on file    Gets together: Not on file    Attends religious service: Not on file    Active member of club or organization: Not on file    Attends meetings of clubs or organizations: Not on file    Relationship status: Not on file  Other Topics Concern  . Not on file  Social History Narrative   Exercise every day    Outpatient Encounter Medications as  of 06/12/2019  Medication Sig  . Ascorbic Acid (VITAMIN C) 1000 MG tablet Take 1,000 mg by mouth daily.   Marland Kitchen azelastine (ASTELIN) 0.1 % nasal spray Place 1 spray into both nostrils 2 (two) times daily. Use in each nostril as directed  . diphenhydramine-acetaminophen (TYLENOL PM) 25-500 MG TABS Take 0.5 tablets by mouth at bedtime as needed.   Marland Kitchen EPINEPHrine (ADRENACLICK) 0.3 [id number] mL IJ SOAJ injection Inject 0.3 mLs (0.3 mg total) into the muscle once.  . loratadine (CLARITIN) 10 MG tablet Take 1 tablet (10 mg total) by mouth daily.  . meclizine (ANTIVERT) 25 MG tablet Take 1 tablet (25 mg total) by mouth 3 (three) times daily as needed for dizziness.  . Misc Natural Products (OSTEO BI-FLEX ADV TRIPLE ST) TABS Take 1 tablet by mouth daily.   . traMADol-acetaminophen (ULTRACET) 37.5-325 MG tablet Take 1 tablet by mouth every 8 (eight) hours as needed for severe pain. (Patient not taking: Reported on 06/12/2019)   No facility-administered encounter medications on file as of 06/12/2019.     Activities of Daily Living In your present state of health, do you have any difficulty performing the following activities: 06/12/2019  Hearing? N  Vision? N  Difficulty concentrating or making decisions? N  Walking or climbing stairs? N  Dressing or bathing? N  Doing errands, shopping? N  Preparing Food and eating ? N  Using the Toilet? N  In the past six months, have you accidently leaked urine? N  Do you have problems with loss of bowel control? N  Managing your Medications? N  Managing your Finances? N  Housekeeping or managing your Housekeeping? N  Some recent data might be hidden    Patient Care Team: Carollee Herter, Alferd Apa, DO as PCP - General Harriett Sine, MD as Consulting Physician (Dermatology) [facility])    Assessment:   This is a routine wellness examination for [name]. Physical assessment deferred to PCP.  Exercise Activities and Dietary recommendations Current  Exercise Habits: Home exercise routine, Type of exercise: walking(pool and tennis), Time (Minutes): > 60, Frequency (Times/Week): 7, Weekly Exercise (Minutes/Week): 0, Intensity: Moderate, Exercise limited by: None identified   Diet (meal preparation, eat out, water intake, caffeinated beverages, dairy products, fruits and vegetables): in general, a "healthy" diet  , well balanced Breakfast: yogurt, fruit, and granola Lunch: fruit, blt, and cheese Dinner:  Dance movement psychotherapist and veg Drinks a lot of water throughout  the day.     Goals    . Maintain healthy active lifestyle.       Fall Risk Fall Risk  06/12/2019 12/27/2017 01/31/2016 01/31/2016 08/31/2014  Falls in the past year? 0 No No No No     Depression Screen PHQ 2/9 Scores 06/12/2019 12/27/2017 01/31/2016 01/31/2016  PHQ - 2 Score 0 0 0 0     Cognitive Function Ad8 score reviewed for issues:  Issues making decisions:no  Less interest in hobbies / activities:no  Repeats questions, stories (family complaining):no  Trouble using ordinary gadgets (microwave, computer, phone):no  Forgets the month or year: no  Mismanaging finances: no  Remembering appts:no  Daily problems with thinking and/or memory:no Ad8 score is=0        Immunization History  Administered Date(s) Administered  . Influenza,inj,Quad PF,6+ Mos 07/02/2014  . Pneumococcal Conjugate-13 10/26/2014  . Pneumococcal Polysaccharide-23 01/31/2016  . Td 01/17/2006  . Zoster 08/07/2011   Screening Tests Health Maintenance  Topic Date Due  . TETANUS/TDAP  01/18/2016  . INFLUENZA VACCINE  05/03/2019  . MAMMOGRAM  05/08/2019  . COLONOSCOPY  05/01/2023  . DEXA SCAN  Completed  . Hepatitis C Screening  Completed  . PNA vac Low Risk Adult  Completed      Plan:   See you next year!  Keep up the great work!   I have personally reviewed and noted the following in the patient's chart:   . Medical and social history . Use of alcohol, tobacco or illicit drugs  .  Current medications and supplements . Functional ability and status . Nutritional status . Physical activity . Advanced directives . List of other physicians . Hospitalizations, surgeries, and ER visits in previous 12 months . Vitals . Screenings to include cognitive, depression, and falls . Referrals and appointments  In addition, I have reviewed and discussed with patient certain preventive protocols, quality metrics, and best practice recommendations. A written personalized care plan for preventive services as well as general preventive health recommendations were provided to patient.     Shela Nevin, [place]  06/12/2019

## 2019-06-12 ENCOUNTER — Ambulatory Visit (INDEPENDENT_AMBULATORY_CARE_PROVIDER_SITE_OTHER): Payer: Medicare HMO | Admitting: *Deleted

## 2019-06-12 ENCOUNTER — Other Ambulatory Visit: Payer: Self-pay

## 2019-06-12 ENCOUNTER — Encounter: Payer: Self-pay | Admitting: *Deleted

## 2019-06-12 VITALS — BP 107/62

## 2019-06-12 DIAGNOSIS — Z0000 Encounter for general adult medical examination without abnormal findings: Secondary | ICD-10-CM

## 2019-06-12 NOTE — Patient Instructions (Signed)
See you next year!  Keep up the great work!   Terri Mendoza , Thank you for taking time to come for your Medicare Wellness Visit. I appreciate your ongoing commitment to your health goals. Please review the following plan we discussed and let me know if I can assist you in the future.   These are the goals we discussed: Goals    . Maintain healthy active lifestyle.       This is a list of the screening recommended for you and due dates:  Health Maintenance  Topic Date Due  . Tetanus Vaccine  01/18/2016  . Flu Shot  05/03/2019  . Mammogram  05/08/2019  . Colon Cancer Screening  05/01/2023  . DEXA scan (bone density measurement)  Completed  .  Hepatitis C: One time screening is recommended by Center for Disease Control  (CDC) for  adults born from [birth year] through 1963.   Completed  . Pneumonia vaccines  Completed    Health Maintenance After Age 49 After age 18, you are at a higher risk for certain long-term diseases and infections as well as injuries from falls. Falls are a major cause of broken bones and head injuries in people who are older than age 68. Getting regular preventive care can help to keep you healthy and well. Preventive care includes getting regular testing and making lifestyle changes as recommended by your health care provider. Talk with your health care provider about:  Which screenings and tests you should have. A screening is a test that checks for a disease when you have no symptoms.  A diet and exercise plan that is right for you. What should I know about screenings and tests to prevent falls? Screening and testing are the best ways to find a health problem early. Early diagnosis and treatment give you the best chance of managing medical conditions that are common after age 74. Certain conditions and lifestyle choices may make you more likely to have a fall. Your health care provider may recommend:  Regular vision checks. Poor vision and conditions such as  cataracts can make you more likely to have a fall. If you wear glasses, make sure to get your prescription updated if your vision changes.  Medicine review. Work with your health care provider to regularly review all of the medicines you are taking, including over-the-counter medicines. Ask your health care provider about any side effects that may make you more likely to have a fall. Tell your health care provider if any medicines that you take make you feel dizzy or sleepy.  Osteoporosis screening. Osteoporosis is a condition that causes the bones to get weaker. This can make the bones weak and cause them to break more easily.  Blood pressure screening. Blood pressure changes and medicines to control blood pressure can make you feel dizzy.  Strength and balance checks. Your health care provider may recommend certain tests to check your strength and balance while standing, walking, or changing positions.  Foot health exam. Foot pain and numbness, as well as not wearing proper footwear, can make you more likely to have a fall.  Depression screening. You may be more likely to have a fall if you have a fear of falling, feel emotionally low, or feel unable to do activities that you used to do.  Alcohol use screening. Using too much alcohol can affect your balance and may make you more likely to have a fall. What actions can I take to lower my risk of falls?  General instructions  Talk with your health care provider about your risks for falling. Tell your health care provider if: ? You fall. Be sure to tell your health care provider about all falls, even ones that seem minor. ? You feel dizzy, sleepy, or off-balance.  Take over-the-counter and prescription medicines only as told by your health care provider. These include any supplements.  Eat a healthy diet and maintain a healthy weight. A healthy diet includes low-fat dairy products, low-fat (lean) meats, and fiber from whole grains, beans, and  lots of fruits and vegetables. Home safety  Remove any tripping hazards, such as rugs, cords, and clutter.  Install safety equipment such as grab bars in bathrooms and safety rails on stairs.  Keep rooms and walkways well-lit. Activity   Follow a regular exercise program to stay fit. This will help you maintain your balance. Ask your health care provider what types of exercise are appropriate for you.  If you need a cane or walker, use it as recommended by your health care provider.  Wear supportive shoes that have nonskid soles. Lifestyle  Do not drink alcohol if your health care provider tells you not to drink.  If you drink alcohol, limit how much you have: ? 0-1 drink a day for women. ? 0-2 drinks a day for men.  Be aware of how much alcohol is in your drink. In the [place] one drink equals one typical bottle of beer (12 oz), one-half glass of wine (5 oz), or one shot of hard liquor (1 oz).  Do not use any products that contain nicotine or tobacco, such as cigarettes and e-cigarettes. If you need help quitting, ask your health care provider. Summary  Having a healthy lifestyle and getting preventive care can help to protect your health and wellness after age 26.  Screening and testing are the best way to find a health problem early and help you avoid having a fall. Early diagnosis and treatment give you the best chance for managing medical conditions that are more common for people who are older than age 70.  Falls are a major cause of broken bones and head injuries in people who are older than age 61. Take precautions to prevent a fall at home.  Work with your health care provider to learn what changes you can make to improve your health and wellness and to prevent falls. This information is not intended to replace advice given to you by your health care provider. Make sure you discuss any questions you have with your health care provider. Document Released: 08/01/2017  Document Revised: 01/09/2019 Document Reviewed: 08/01/2017 Elsevier Patient Education  2020 [employer].

## 2019-06-16 ENCOUNTER — Other Ambulatory Visit: Payer: Self-pay

## 2019-06-16 ENCOUNTER — Ambulatory Visit (INDEPENDENT_AMBULATORY_CARE_PROVIDER_SITE_OTHER): Payer: Medicare HMO | Admitting: Family Medicine

## 2019-06-16 ENCOUNTER — Encounter: Payer: Self-pay | Admitting: Family Medicine

## 2019-06-16 VITALS — BP 110/75 | HR 75 | Temp 97.1°F | Resp 18 | Ht 65.5 in | Wt 170.4 lb

## 2019-06-16 DIAGNOSIS — R739 Hyperglycemia, unspecified: Secondary | ICD-10-CM

## 2019-06-16 DIAGNOSIS — E2839 Other primary ovarian failure: Secondary | ICD-10-CM | POA: Diagnosis not present

## 2019-06-16 DIAGNOSIS — Z0000 Encounter for general adult medical examination without abnormal findings: Secondary | ICD-10-CM | POA: Diagnosis not present

## 2019-06-16 NOTE — Patient Instructions (Signed)
Preventive Care 38 Years and Older, Female Preventive care refers to lifestyle choices and visits with your health care provider that can promote health and wellness. This includes:  A yearly physical exam. This is also called an annual well check.  Regular dental and eye exams.  Immunizations.  Screening for certain conditions.  Healthy lifestyle choices, such as diet and exercise. What can I expect for my preventive care visit? Physical exam Your health care provider will check:  Height and weight. These may be used to calculate body mass index (BMI), which is a measurement that tells if you are at a healthy weight.  Heart rate and blood pressure.  Your skin for abnormal spots. Counseling Your health care provider may ask you questions about:  Alcohol, tobacco, and drug use.  Emotional well-being.  Home and relationship well-being.  Sexual activity.  Eating habits.  History of falls.  Memory and ability to understand (cognition).  Work and work Statistician.  Pregnancy and menstrual history. What immunizations do I need?  Influenza (flu) vaccine  This is recommended every year. Tetanus, diphtheria, and pertussis (Tdap) vaccine  You may need a Td booster every 10 years. Varicella (chickenpox) vaccine  You may need this vaccine if you have not already been vaccinated. Zoster (shingles) vaccine  You may need this after age 33. Pneumococcal conjugate (PCV13) vaccine  One dose is recommended after age 33. Pneumococcal polysaccharide (PPSV23) vaccine  One dose is recommended after age 72. Measles, mumps, and rubella (MMR) vaccine  You may need at least one dose of MMR if you were born in 1957 or later. You may also need a second dose. Meningococcal conjugate (MenACWY) vaccine  You may need this if you have certain conditions. Hepatitis A vaccine  You may need this if you have certain conditions or if you travel or work in places where you may be exposed  to hepatitis A. Hepatitis B vaccine  You may need this if you have certain conditions or if you travel or work in places where you may be exposed to hepatitis B. Haemophilus influenzae type b (Hib) vaccine  You may need this if you have certain conditions. You may receive vaccines as individual doses or as more than one vaccine together in one shot (combination vaccines). Talk with your health care provider about the risks and benefits of combination vaccines. What tests do I need? Blood tests  Lipid and cholesterol levels. These may be checked every 5 years, or more frequently depending on your overall health.  Hepatitis C test.  Hepatitis B test. Screening  Lung cancer screening. You may have this screening every year starting at age 39 if you have a 30-pack-year history of smoking and currently smoke or have quit within the past 15 years.  Colorectal cancer screening. All adults should have this screening starting at age 36 and continuing until age 15. Your health care provider may recommend screening at age 23 if you are at increased risk. You will have tests every 1-10 years, depending on your results and the type of screening test.  Diabetes screening. This is done by checking your blood sugar (glucose) after you have not eaten for a while (fasting). You may have this done every 1-3 years.  Mammogram. This may be done every 1-2 years. Talk with your health care provider about how often you should have regular mammograms.  BRCA-related cancer screening. This may be done if you have a family history of breast, ovarian, tubal, or peritoneal cancers.  Other tests  Sexually transmitted disease (STD) testing.  Bone density scan. This is done to screen for osteoporosis. You may have this done starting at age 55. Follow these instructions at home: Eating and drinking  Eat a diet that includes fresh fruits and vegetables, whole grains, lean protein, and low-fat dairy products. Limit  your intake of foods with high amounts of sugar, saturated fats, and salt.  Take vitamin and mineral supplements as recommended by your health care provider.  Do not drink alcohol if your health care provider tells you not to drink.  If you drink alcohol: ? Limit how much you have to 0-1 drink a day. ? Be aware of how much alcohol is in your drink. In the [place] one drink equals one 12 oz bottle of beer (355 mL), one 5 oz glass of wine (148 mL), or one 1 oz glass of hard liquor (44 mL). Lifestyle  Take daily care of your teeth and gums.  Stay active. Exercise for at least 30 minutes on 5 or more days each week.  Do not use any products that contain nicotine or tobacco, such as cigarettes, e-cigarettes, and chewing tobacco. If you need help quitting, ask your health care provider.  If you are sexually active, practice safe sex. Use a condom or other form of protection in order to prevent STIs (sexually transmitted infections).  Talk with your health care provider about taking a low-dose aspirin or statin. What's next?  Go to your health care provider once a year for a well check visit.  Ask your health care provider how often you should have your eyes and teeth checked.  Stay up to date on all vaccines. This information is not intended to replace advice given to you by your health care provider. Make sure you discuss any questions you have with your health care provider. Document Released: 10/15/2015 Document Revised: 09/12/2018 Document Reviewed: 09/12/2018 Elsevier Patient Education  2020 [employer].

## 2019-06-16 NOTE — Progress Notes (Signed)
Subjective:     Terri Mendoza is a 73 y.o. female and is here for a comprehensive physical exam. The patient reports no problems.  Social History   Socioeconomic History  . Marital status: Married    Spouse name: Not on file  . Number of children: Not on file  . Years of education: Not on file  . Highest education level: Not on file  Occupational History  . Not on file  Social Needs  . Financial resource strain: Not on file  . Food insecurity    Worry: Not on file    Inability: Not on file  . Transportation needs    Medical: Not on file    Non-medical: Not on file  Tobacco Use  . Smoking status: Never Smoker  . Smokeless tobacco: Never Used  Substance and Sexual Activity  . Alcohol use: Yes    Alcohol/week: 4.0 standard drinks    Types: 4 Glasses of wine per week  . Drug use: No  . Sexual activity: Yes    Partners: Male  Lifestyle  . Physical activity    Days per week: Not on file    Minutes per session: Not on file  . Stress: Not on file  Relationships  . Social Herbalist on phone: Not on file    Gets together: Not on file    Attends religious service: Not on file    Active member of club or organization: Not on file    Attends meetings of clubs or organizations: Not on file    Relationship status: Not on file  . Intimate partner violence    Fear of current or ex partner: Not on file    Emotionally abused: Not on file    Physically abused: Not on file    Forced sexual activity: Not on file  Other Topics Concern  . Not on file  Social History Narrative   Exercise every day   Health Maintenance  Topic Date Due  . Samul Dada  01/18/2016  . MAMMOGRAM  05/08/2019  . INFLUENZA VACCINE  12/31/2019 (Originally 05/03/2019)  . COLONOSCOPY  05/01/2023  . DEXA SCAN  Completed  . Hepatitis C Screening  Completed  . PNA vac Low Risk Adult  Completed    The following portions of the patient's history were reviewed and updated as appropriate: She   has a past medical history of Allergy, Atypical chest pain (08/31/2014), Heart murmur, Migraines, Osteopenia, and Pulmonary nodule (09/02/2014). She does not have any pertinent problems on file. She  has a past surgical history that includes Colonoscopy; Mohs surgery (02/14/2019); and Finger fracture surgery (Right, 03/08/2019). Her family history includes CAD in her father; Cancer (age of onset: 35) in her mother; Colon cancer (age of onset: 44) in her mother; Coronary artery disease in an other family member; Factor V Leiden deficiency in her daughter; Heart disease (age of onset: 1) in her father; Heart disease (age of onset: 36) in her mother; Heart failure in an other family member. She  reports that she has never smoked. She has never used smokeless tobacco. She reports current alcohol use of about 4.0 standard drinks of alcohol per week. She reports that she does not use drugs. She has a current medication list which includes the following prescription(s): vitamin c, azelastine, diphenhydramine-acetaminophen, epinephrine, loratadine, meclizine, osteo bi-flex adv triple st, and tramadol-acetaminophen. Current Outpatient Medications on File Prior to Visit  Medication Sig Dispense Refill  . Ascorbic Acid (VITAMIN C)  1000 MG tablet Take 1,000 mg by mouth daily.     Marland Kitchen azelastine (ASTELIN) 0.1 % nasal spray Place 1 spray into both nostrils 2 (two) times daily. Use in each nostril as directed 30 mL 12  . diphenhydramine-acetaminophen (TYLENOL PM) 25-500 MG TABS Take 0.5 tablets by mouth at bedtime as needed.     Marland Kitchen EPINEPHrine (ADRENACLICK) 0.3 [id number] mL IJ SOAJ injection Inject 0.3 mLs (0.3 mg total) into the muscle once. 2 Device 0  . loratadine (CLARITIN) 10 MG tablet Take 1 tablet (10 mg total) by mouth daily. 30 tablet 11  . meclizine (ANTIVERT) 25 MG tablet Take 1 tablet (25 mg total) by mouth 3 (three) times daily as needed for dizziness. 30 tablet 0  . Misc Natural Products (OSTEO BI-FLEX ADV  TRIPLE ST) TABS Take 1 tablet by mouth daily.     . traMADol-acetaminophen (ULTRACET) 37.5-325 MG tablet Take 1 tablet by mouth every 8 (eight) hours as needed for severe pain. 30 tablet 0   No current facility-administered medications on file prior to visit.    She is allergic to iohexol and meloxicam..  Review of Systems Review of Systems  Constitutional: Negative for activity change, appetite change and fatigue.  HENT: Negative for hearing loss, congestion, tinnitus and ear discharge.  dentist q68m Eyes: Negative for visual disturbance (see optho q1y -- vision corrected to 20/20 with glasses).  Respiratory: Negative for cough, chest tightness and shortness of breath.   Cardiovascular: Negative for chest pain, palpitations and leg swelling.  Gastrointestinal: Negative for abdominal pain, diarrhea, constipation and abdominal distention.  Genitourinary: Negative for urgency, frequency, decreased urine volume and difficulty urinating.  Musculoskeletal: Negative for back pain, artd and gait problem. + stiffness and tenderness R index finger-- other fingers on R hand swollen Skin: Negative for color change, pallor and rash.  Neurological: Negative for dizziness, light-headedness, numbness and headaches.  Hematological: Negative for adenopathy. Does not bruise/bleed easily.  Psychiatric/Behavioral: Negative for suicidal ideas, confusion, sleep disturbance, self-injury, dysphoric mood, decreased concentration and agitation.       Objective:    BP 110/75 (BP Location: Left Arm, Patient Position: Sitting, Cuff Size: Normal)   Pulse 75   Temp (!) 97.1 F (36.2 C) (Temporal)   Resp 18   Ht 5' 5.5" (1.664 m)   Wt 170 lb 6.4 oz (77.3 kg)   SpO2 96%   BMI 27.92 kg/m  General appearance: alert, cooperative, appears stated age and no distress Head: Normocephalic, without obvious abnormality, atraumatic Eyes: conjunctivae/corneas clear. PERRL, EOM's intact. Fundi benign. Ears: normal TM's  and external ear canals both ears Nose: Nares normal. Septum midline. Mucosa normal. No drainage or sinus tenderness. Throat: lips, mucosa, and tongue normal; teeth and gums normal Neck: no adenopathy, no carotid bruit, no JVD, supple, symmetrical, trachea midline and thyroid not enlarged, symmetric, no tenderness/mass/nodules Back: symmetric, no curvature. ROM normal. No CVA tenderness. Lungs: clear to auscultation bilaterally Breasts: normal appearance, no masses or tenderness Heart: regular rate and rhythm, S1, S2 normal, no murmur, click, rub or gallop Abdomen: soft, non-tender; bowel sounds normal; no masses,  no organomegaly Pelvic: not indicated; post-menopausal, no abnormal Pap smears in past Extremities: extremities normal, atraumatic, no cyanosis or edema----  Had fx R index finger-- still under ortho care--still has swelling / stiffness R hand  Pulses: 2+ and symmetric Skin: Skin color, texture, turgor normal. No rashes or lesions Lymph nodes: Cervical, supraclavicular, and axillary nodes normal. Neurologic: Alert and oriented X 3, normal strength  and tone. Normal symmetric reflexes. Normal coordination and gait    Assessment:    Healthy female exam.      Plan:    ghm utd Pt refused flu shot Check labs See After Visit Summary for Counseling Recommendations    1. Estrogen deficiency  - DG Bone Density; Future  2. Hyperglycemia Check labs  - Lipid panel - Hemoglobin A1c - Comprehensive metabolic panel  3. Preventative health care See above Flu shot refused

## 2019-06-17 LAB — LIPID PANEL
Cholesterol: 199 mg/dL (ref 0–200)
HDL: 62 mg/dL (ref >39.00)
LDL Cholesterol: 112 mg/dL — ABNORMAL HIGH (ref 0–99)
NonHDL: 136.73
Total CHOL/HDL Ratio: 3
Triglycerides: 126 mg/dL (ref 0.0–149.0)
VLDL: 25.2 mg/dL (ref 0.0–40.0)

## 2019-06-17 LAB — COMPREHENSIVE METABOLIC PANEL
ALT: 13 U/L (ref 0–35)
AST: 19 U/L (ref 0–37)
Albumin: 4.3 g/dL (ref 3.5–5.2)
Alkaline Phosphatase: 70 U/L (ref 39–117)
BUN: 19 mg/dL (ref 6–23)
CO2: 30 mEq/L (ref 19–32)
Calcium: 9.4 mg/dL (ref 8.4–10.5)
Chloride: 105 mEq/L (ref 96–112)
Creatinine, Ser: 0.54 mg/dL (ref 0.40–1.20)
GFR: 111.3 mL/min (ref >60.00)
Glucose, Bld: 94 mg/dL (ref 70–99)
Potassium: 3.8 mEq/L (ref 3.5–5.1)
Sodium: 143 mEq/L (ref 135–145)
Total Bilirubin: 0.4 mg/dL (ref 0.2–1.2)
Total Protein: 6.5 g/dL (ref 6.0–8.3)

## 2019-06-17 LAB — HEMOGLOBIN A1C: Hgb A1c MFr Bld: 5.7 % (ref 4.6–6.5)

## 2019-06-19 DIAGNOSIS — S62610D Displaced fracture of proximal phalanx of right index finger, subsequent encounter for fracture with routine healing: Secondary | ICD-10-CM | POA: Diagnosis not present

## 2019-06-19 DIAGNOSIS — Z5189 Encounter for other specified aftercare: Secondary | ICD-10-CM | POA: Diagnosis not present

## 2019-07-09 ENCOUNTER — Other Ambulatory Visit: Payer: Self-pay

## 2019-07-09 ENCOUNTER — Encounter: Payer: Self-pay | Admitting: Family Medicine

## 2019-07-09 ENCOUNTER — Ambulatory Visit (INDEPENDENT_AMBULATORY_CARE_PROVIDER_SITE_OTHER): Payer: Medicare HMO | Admitting: Family Medicine

## 2019-07-09 DIAGNOSIS — R300 Dysuria: Secondary | ICD-10-CM | POA: Diagnosis not present

## 2019-07-09 MED ORDER — CEPHALEXIN 500 MG PO CAPS: 500.0000 mg | ORAL_CAPSULE | Freq: Two times a day (BID) | ORAL | 0 refills | Status: DC

## 2019-07-09 NOTE — Progress Notes (Signed)
Virtual Visit via Video Note  I connected with Terri Mendoza on 07/09/19 at 10:20 AM EDT by a video enabled telemedicine application and verified that I am speaking with the correct person using two identifiers.  Location: Patient: home  Provider: home    I discussed the limitations of evaluation and management by telemedicine and the availability of in person appointments. The patient expressed understanding and agreed to proceed.  History of Present Illness: Pt is home c/o urine with odor, dysuria and dark and cloudy     Symptoms started yesterday   Observations/Objective:l Afebrile --- no other vitals obtained  Pt is in nad   Assessment and Plan: 1. Dysuria If symptoms are no better -- we will check ua and culture on Friday  - cephALEXin (KEFLEX) 500 MG capsule; Take 1 capsule (500 mg total) by mouth 2 (two) times daily.  Dispense: 14 capsule; Refill: 0   Follow Up Instructions:    I discussed the assessment and treatment plan with the patient. The patient was provided an opportunity to ask questions and all were answered. The patient agreed with the plan and demonstrated an understanding of the instructions.   The patient was advised to call back or seek an in-person evaluation if the symptoms worsen or if the condition fails to improve as anticipated.  I provided 15 minutes of non-face-to-face time during this encounter.   [name], DO

## 2019-07-21 DIAGNOSIS — Z5189 Encounter for other specified aftercare: Secondary | ICD-10-CM | POA: Diagnosis not present

## 2019-07-21 DIAGNOSIS — S62610D Displaced fracture of proximal phalanx of right index finger, subsequent encounter for fracture with routine healing: Secondary | ICD-10-CM | POA: Diagnosis not present

## 2019-07-21 DIAGNOSIS — M79641 Pain in right hand: Secondary | ICD-10-CM | POA: Diagnosis not present

## 2019-07-23 DIAGNOSIS — R69 Illness, unspecified: Secondary | ICD-10-CM | POA: Diagnosis not present

## 2019-08-18 DIAGNOSIS — R69 Illness, unspecified: Secondary | ICD-10-CM | POA: Diagnosis not present

## 2019-10-23 ENCOUNTER — Ambulatory Visit: Payer: Medicare HMO | Attending: Internal Medicine

## 2019-10-23 DIAGNOSIS — Z23 Encounter for immunization: Secondary | ICD-10-CM | POA: Insufficient documentation

## 2019-10-23 NOTE — Progress Notes (Signed)
   Covid-19 Vaccination Clinic  Name:  Terri Mendoza    MRN: [id number] DOB: [birth year]  10/23/2019  Terri Mendoza was observed post Covid-19 immunization for 30 minutes based on pre-vaccination screening without incidence. She was provided with Vaccine Information Sheet and instruction to access the V-Safe system.   Terri Mendoza was instructed to call 911 with any severe reactions post vaccine: Marland Kitchen Difficulty breathing  . Swelling of your face and throat  . A fast heartbeat  . A bad rash all over your body  . Dizziness and weakness    Immunizations Administered    Name Date Dose VIS Date Route   Pfizer COVID-19 Vaccine 10/23/2019  5:03 PM 0.3 mL 09/12/2019 Intramuscular   Manufacturer: [place]   Lot: [id number]   [place]: [id number]

## 2019-10-28 ENCOUNTER — Other Ambulatory Visit: Payer: Self-pay | Admitting: Family Medicine

## 2019-10-28 DIAGNOSIS — Z1231 Encounter for screening mammogram for malignant neoplasm of breast: Secondary | ICD-10-CM

## 2019-11-12 ENCOUNTER — Ambulatory Visit: Payer: Medicare HMO | Attending: Internal Medicine

## 2019-11-12 DIAGNOSIS — Z23 Encounter for immunization: Secondary | ICD-10-CM

## 2019-11-12 NOTE — Progress Notes (Signed)
   Covid-19 Vaccination Clinic  Name:  TEREZA MORSCH    MRN: [id number] DOB: [birth year]  11/12/2019  Ms. Winograd was observed post Covid-19 immunization for 30 minutes based on pre-vaccination screening without incidence. She was provided with Vaccine Information Sheet and instruction to access the V-Safe system.   Ms. Vitatoe was instructed to call 911 with any severe reactions post vaccine: Marland Kitchen Difficulty breathing  . Swelling of your face and throat  . A fast heartbeat  . A bad rash all over your body  . Dizziness and weakness    Immunizations Administered    Name Date Dose VIS Date Route   Pfizer COVID-19 Vaccine 11/12/2019  9:59 AM 0.3 mL 09/12/2019 Intramuscular   Manufacturer: [employer]   Lot: [id number]   [place]: [id number]

## 2019-11-24 DIAGNOSIS — Z85828 Personal history of other malignant neoplasm of skin: Secondary | ICD-10-CM | POA: Diagnosis not present

## 2019-11-24 DIAGNOSIS — L814 Other melanin hyperpigmentation: Secondary | ICD-10-CM | POA: Diagnosis not present

## 2019-11-24 DIAGNOSIS — L570 Actinic keratosis: Secondary | ICD-10-CM | POA: Diagnosis not present

## 2019-11-24 DIAGNOSIS — L820 Inflamed seborrheic keratosis: Secondary | ICD-10-CM | POA: Diagnosis not present

## 2019-11-24 DIAGNOSIS — D225 Melanocytic nevi of trunk: Secondary | ICD-10-CM | POA: Diagnosis not present

## 2019-11-24 DIAGNOSIS — D2272 Melanocytic nevi of left lower limb, including hip: Secondary | ICD-10-CM | POA: Diagnosis not present

## 2019-11-24 DIAGNOSIS — D1801 Hemangioma of skin and subcutaneous tissue: Secondary | ICD-10-CM | POA: Diagnosis not present

## 2019-11-24 DIAGNOSIS — L821 Other seborrheic keratosis: Secondary | ICD-10-CM | POA: Diagnosis not present

## 2019-12-01 DIAGNOSIS — H524 Presbyopia: Secondary | ICD-10-CM | POA: Diagnosis not present

## 2019-12-01 DIAGNOSIS — H40019 Open angle with borderline findings, low risk, unspecified eye: Secondary | ICD-10-CM | POA: Diagnosis not present

## 2019-12-10 ENCOUNTER — Other Ambulatory Visit: Payer: Self-pay

## 2019-12-10 ENCOUNTER — Ambulatory Visit
Admission: RE | Admit: 2019-12-10 | Discharge: 2019-12-10 | Disposition: A | Discharge status: Home / Self Care | Payer: Medicare HMO | Source: Ambulatory Visit | Attending: Family Medicine | Admitting: Family Medicine

## 2019-12-10 DIAGNOSIS — Z1231 Encounter for screening mammogram for malignant neoplasm of breast: Secondary | ICD-10-CM

## 2020-01-08 ENCOUNTER — Other Ambulatory Visit: Payer: Self-pay | Admitting: Family Medicine

## 2020-01-08 DIAGNOSIS — J069 Acute upper respiratory infection, unspecified: Secondary | ICD-10-CM

## 2020-01-09 DIAGNOSIS — Z0100 Encounter for examination of eyes and vision without abnormal findings: Secondary | ICD-10-CM | POA: Diagnosis not present

## 2020-01-19 IMAGING — MG DIGITAL SCREENING BILATERAL MAMMOGRAM WITH TOMO AND CAD
8 series · 9 of 24 positions shown · non-contrast
Comparison: Previous exam(s).

CLINICAL DATA: Screening.

EXAM:
DIGITAL SCREENING BILATERAL MAMMOGRAM WITH TOMO AND CAD

[R CC synth-2D]
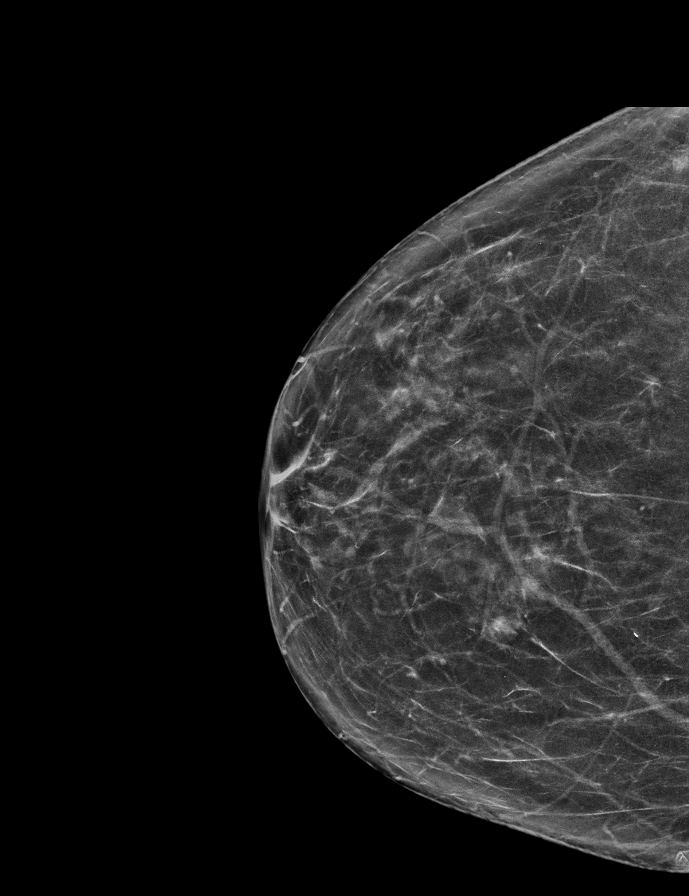

[R MLO synth-2D]
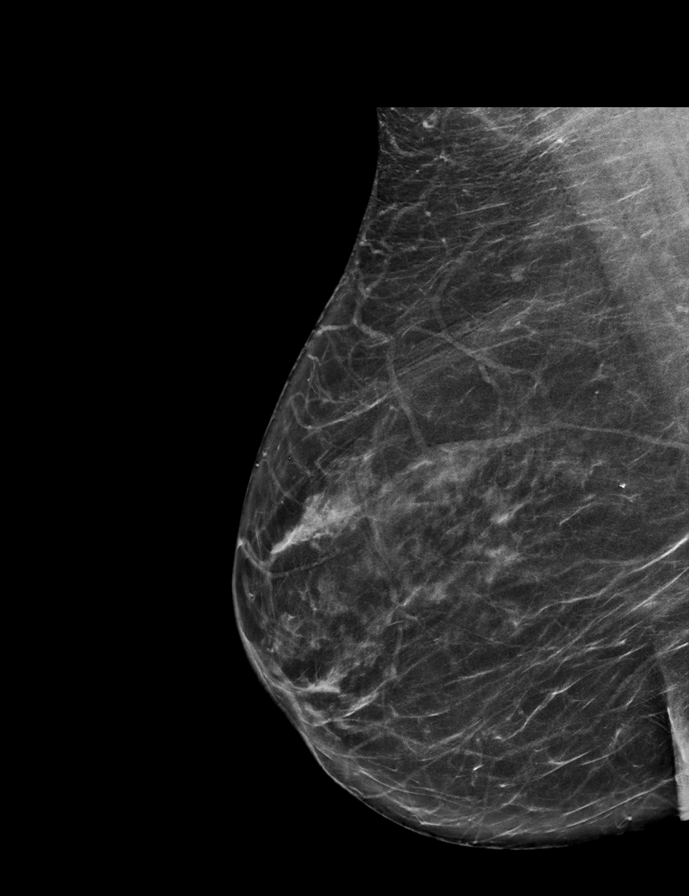

[L MLO synth-2D]
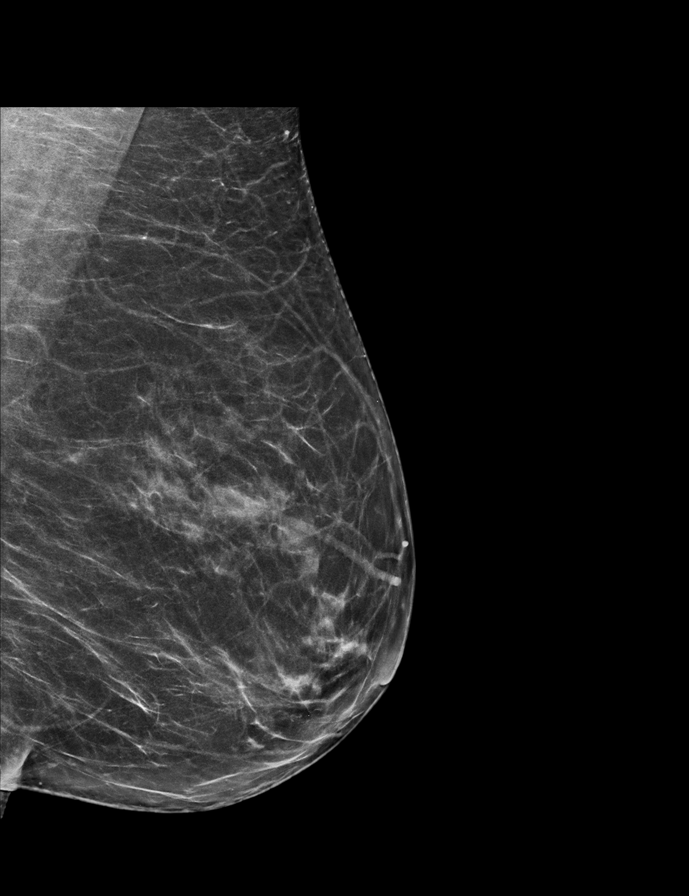

[L CC synth-2D]
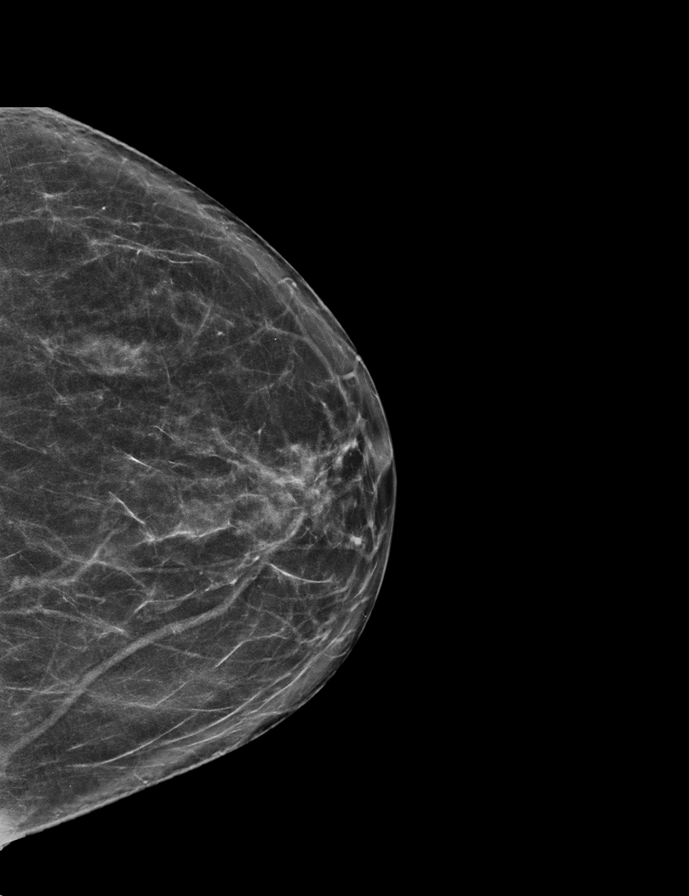

[R CC tomo · 2 of 64 frames shown]
[frame 21/64]
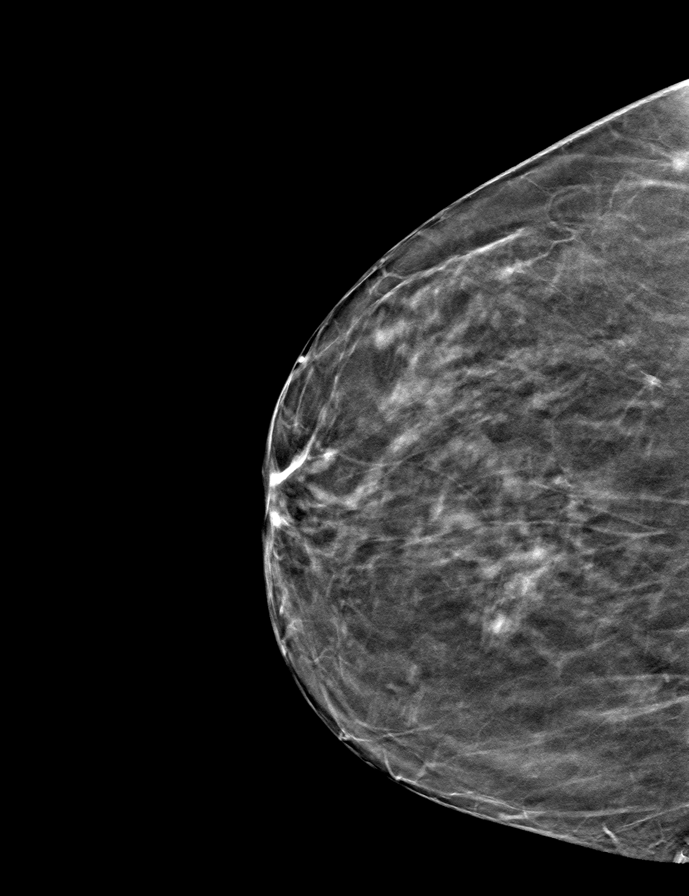
[frame 33/64]
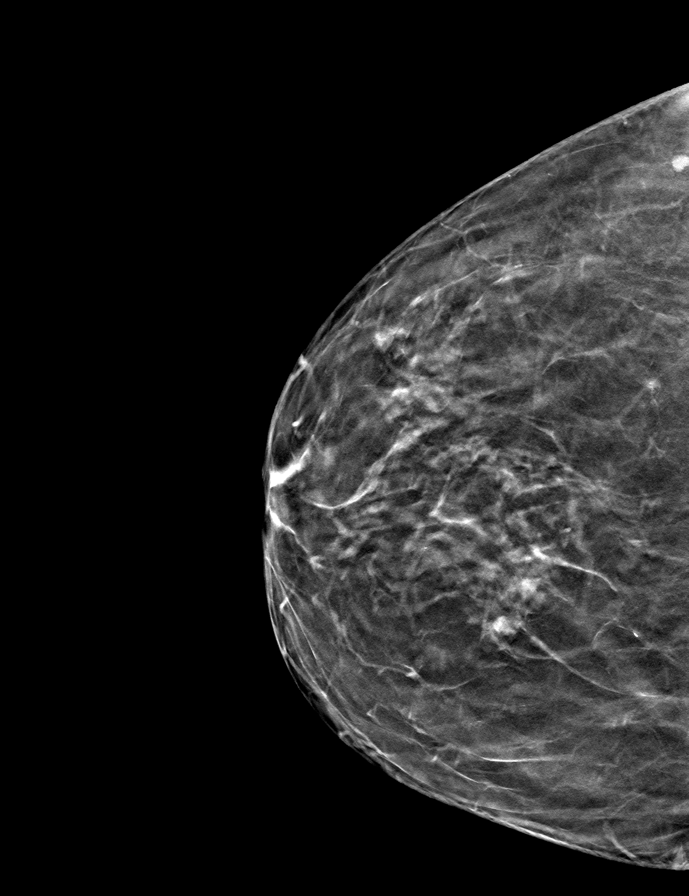

[R MLO tomo · tomo slice 37/72.0]
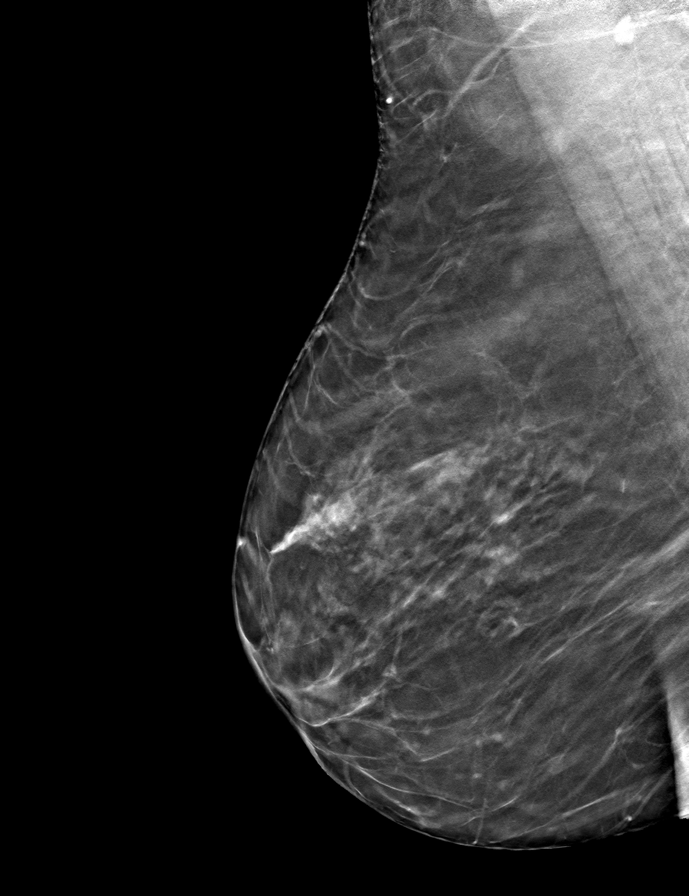

[L CC tomo · tomo slice 33/66.0]
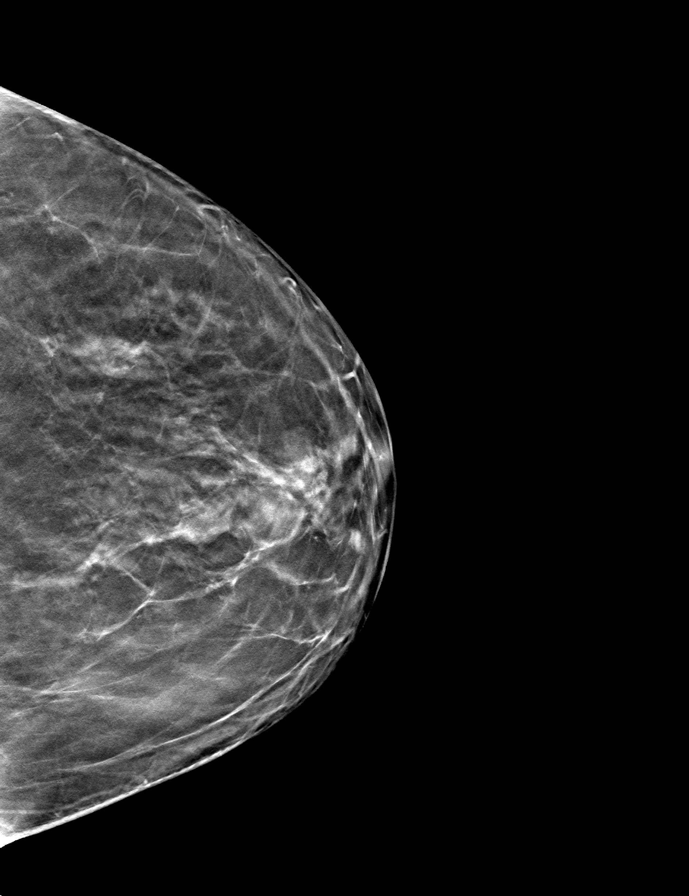

[L MLO tomo · tomo slice 35/70.0]
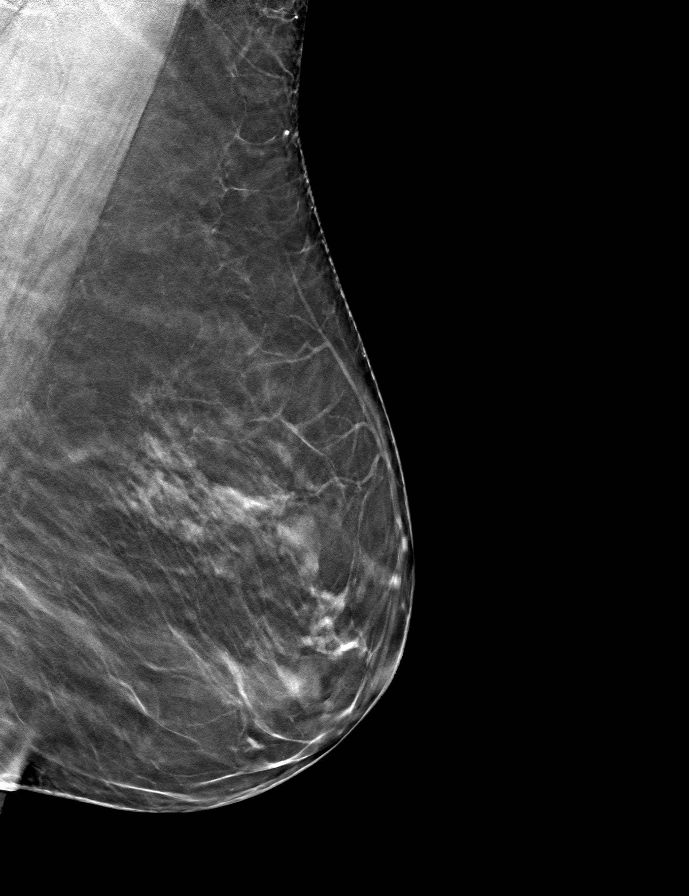

[9 of 24 positions shown; findings below may reference images not displayed]

ACR Breast Density Category b: There are scattered areas of
fibroglandular density.
FINDINGS: There are no findings suspicious for malignancy. Images were
processed with CAD.
IMPRESSION: No mammographic evidence of malignancy. A result letter of this
screening mammogram will be mailed directly to the patient.

RECOMMENDATION:
Screening mammogram in one year. (Code:[id number])

BI-RADS CATEGORY  1: Negative.

## 2020-02-26 DIAGNOSIS — R69 Illness, unspecified: Secondary | ICD-10-CM | POA: Diagnosis not present

## 2020-11-25 DIAGNOSIS — Z85828 Personal history of other malignant neoplasm of skin: Secondary | ICD-10-CM | POA: Diagnosis not present

## 2020-11-25 DIAGNOSIS — L814 Other melanin hyperpigmentation: Secondary | ICD-10-CM | POA: Diagnosis not present

## 2020-11-25 DIAGNOSIS — D2261 Melanocytic nevi of right upper limb, including shoulder: Secondary | ICD-10-CM | POA: Diagnosis not present

## 2020-11-25 DIAGNOSIS — D2272 Melanocytic nevi of left lower limb, including hip: Secondary | ICD-10-CM | POA: Diagnosis not present

## 2020-11-25 DIAGNOSIS — L821 Other seborrheic keratosis: Secondary | ICD-10-CM | POA: Diagnosis not present

## 2020-11-25 DIAGNOSIS — D225 Melanocytic nevi of trunk: Secondary | ICD-10-CM | POA: Diagnosis not present

## 2020-12-07 ENCOUNTER — Other Ambulatory Visit: Payer: Self-pay | Admitting: Family Medicine

## 2020-12-07 DIAGNOSIS — Z1231 Encounter for screening mammogram for malignant neoplasm of breast: Secondary | ICD-10-CM

## 2020-12-23 ENCOUNTER — Ambulatory Visit (INDEPENDENT_AMBULATORY_CARE_PROVIDER_SITE_OTHER): Payer: Medicare HMO | Admitting: Family Medicine

## 2020-12-23 ENCOUNTER — Other Ambulatory Visit: Payer: Self-pay

## 2020-12-23 ENCOUNTER — Encounter: Payer: Self-pay | Admitting: Family Medicine

## 2020-12-23 VITALS — BP 110/70 | HR 77 | Temp 97.8°F | Ht 65.5 in | Wt 165.0 lb

## 2020-12-23 DIAGNOSIS — Z0000 Encounter for general adult medical examination without abnormal findings: Secondary | ICD-10-CM | POA: Diagnosis not present

## 2020-12-23 DIAGNOSIS — E2839 Other primary ovarian failure: Secondary | ICD-10-CM

## 2020-12-23 DIAGNOSIS — Z136 Encounter for screening for cardiovascular disorders: Secondary | ICD-10-CM

## 2020-12-23 NOTE — Patient Instructions (Signed)
Preventive Care 73 Years and Older, Female Preventive care refers to lifestyle choices and visits with your health care provider that can promote health and wellness. This includes:  A yearly physical exam. This is also called an annual wellness visit.  Regular dental and eye exams.  Immunizations.  Screening for certain conditions.  Healthy lifestyle choices, such as: ? Eating a healthy diet. ? Getting regular exercise. ? Not using drugs or products that contain nicotine and tobacco. ? Limiting alcohol use. What can I expect for my preventive care visit? Physical exam Your health care provider will check your:  Height and weight. These may be used to calculate your BMI (body mass index). BMI is a measurement that tells if you are at a healthy weight.  Heart rate and blood pressure.  Body temperature.  Skin for abnormal spots. Counseling Your health care provider may ask you questions about your:  Past medical problems.  Family's medical history.  Alcohol, tobacco, and drug use.  Emotional well-being.  Home life and relationship well-being.  Sexual activity.  Diet, exercise, and sleep habits.  History of falls.  Memory and ability to understand (cognition).  Work and work Statistician.  Pregnancy and menstrual history.  Access to firearms. What immunizations do I need? Vaccines are usually given at various ages, according to a schedule. Your health care provider will recommend vaccines for you based on your age, medical history, and lifestyle or other factors, such as travel or where you work.   What tests do I need? Blood tests  Lipid and cholesterol levels. These may be checked every 5 years, or more often depending on your overall health.  Hepatitis C test.  Hepatitis B test. Screening  Lung cancer screening. You may have this screening every year starting at age 73 if you have a 30-pack-year history of smoking and currently smoke or have quit within  the past 15 years.  Colorectal cancer screening. ? All adults should have this screening starting at age 73 and continuing until age 73. ? Your health care provider may recommend screening at age 73 if you are at increased risk. ? You will have tests every 1-10 years, depending on your results and the type of screening test.  Diabetes screening. ? This is done by checking your blood sugar (glucose) after you have not eaten for a while (fasting). ? You may have this done every 1-3 years.  Mammogram. ? This may be done every 1-2 years. ? Talk with your health care provider about how often you should have regular mammograms.  Abdominal aortic aneurysm (AAA) screening. You may need this if you are a current or former smoker.  BRCA-related cancer screening. This may be done if you have a family history of breast, ovarian, tubal, or peritoneal cancers. Other tests  STD (sexually transmitted disease) testing, if you are at risk.  Bone density scan. This is done to screen for osteoporosis. You may have this done starting at age 73. Talk with your health care provider about your test results, treatment options, and if necessary, the need for more tests. Follow these instructions at home: Eating and drinking  Eat a diet that includes fresh fruits and vegetables, whole grains, lean protein, and low-fat dairy products. Limit your intake of foods with high amounts of sugar, saturated fats, and salt.  Take vitamin and mineral supplements as recommended by your health care provider.  Do not drink alcohol if your health care provider tells you not to drink.  If you drink alcohol: ? Limit how much you have to 0-1 drink a day. ? Be aware of how much alcohol is in your drink. In the [place] one drink equals one 12 oz bottle of beer (355 mL), one 5 oz glass of wine (148 mL), or one 1 oz glass of hard liquor (44 mL).   Lifestyle  Take daily care of your teeth and gums. Brush your teeth every morning  and night with fluoride toothpaste. Floss one time each day.  Stay active. Exercise for at least 30 minutes 5 or more days each week.  Do not use any products that contain nicotine or tobacco, such as cigarettes, e-cigarettes, and chewing tobacco. If you need help quitting, ask your health care provider.  Do not use drugs.  If you are sexually active, practice safe sex. Use a condom or other form of protection in order to prevent STIs (sexually transmitted infections).  Talk with your health care provider about taking a low-dose aspirin or statin.  Find healthy ways to cope with stress, such as: ? Meditation, yoga, or listening to music. ? Journaling. ? Talking to a trusted person. ? Spending time with friends and family. Safety  Always wear your seat belt while driving or riding in a vehicle.  Do not drive: ? If you have been drinking alcohol. Do not ride with someone who has been drinking. ? When you are tired or distracted. ? While texting.  Wear a helmet and other protective equipment during sports activities.  If you have firearms in your house, make sure you follow all gun safety procedures. What's next?  Visit your health care provider once a year for an annual wellness visit.  Ask your health care provider how often you should have your eyes and teeth checked.  Stay up to date on all vaccines. This information is not intended to replace advice given to you by your health care provider. Make sure you discuss any questions you have with your health care provider. Document Revised: 09/08/2020 Document Reviewed: 09/12/2018 Elsevier Patient Education  2021 Elsevier Inc.  

## 2020-12-23 NOTE — Progress Notes (Signed)
Subjective:     Terri Mendoza is a 75 y.o. female and is here for a comprehensive physical exam. The patient reports no problems.  Social History   Socioeconomic History  . Marital status: Married    Spouse name: Not on file  . Number of children: Not on file  . Years of education: Not on file  . Highest education level: Not on file  Occupational History  . Not on file  Tobacco Use  . Smoking status: Never Smoker  . Smokeless tobacco: Never Used  Substance and Sexual Activity  . Alcohol use: Yes    Alcohol/week: 4.0 standard drinks    Types: 4 Glasses of wine per week  . Drug use: No  . Sexual activity: Yes    Partners: Male  Other Topics Concern  . Not on file  Social History Narrative   Exercise every day   Social Determinants of Health   Financial Resource Strain: Not on file  Food Insecurity: Not on file  Transportation Needs: Not on file  Physical Activity: Not on file  Stress: Not on file  Social Connections: Not on file  Intimate Partner Violence: Not on file   Health Maintenance  Topic Date Due  . Samul Dada  01/18/2016  . COVID-19 Vaccine (3 - Booster for Pfizer series) 05/11/2020  . MAMMOGRAM  12/09/2020  . INFLUENZA VACCINE  12/30/2020 (Originally 05/02/2020)  . COLONOSCOPY (Pts 45-85yrs Insurance coverage will need to be confirmed)  05/01/2023  . DEXA SCAN  Completed  . Hepatitis C Screening  Completed  . PNA vac Low Risk Adult  Completed  . HPV VACCINES  Aged Out    The following portions of the patient's history were reviewed and updated as appropriate:  She  has a past medical history of Allergy, Atypical chest pain (08/31/2014), Heart murmur, Migraines, Osteopenia, and Pulmonary nodule (09/02/2014). She does not have any pertinent problems on file. She  has a past surgical history that includes Colonoscopy; Mohs surgery (02/14/2019); and Finger fracture surgery (Right, 03/08/2019). Her family history includes CAD in her father; Cancer (age  of onset: 74) in her mother; Colon cancer (age of onset: 59) in her mother; Coronary artery disease in an other family member; Factor V Leiden deficiency in her daughter; Heart disease (age of onset: 38) in her father; Heart disease (age of onset: 75) in her mother; Heart failure in an other family member. She  reports that she has never smoked. She has never used smokeless tobacco. She reports current alcohol use of about 4.0 standard drinks of alcohol per week. She reports that she does not use drugs. She has a current medication list which includes the following prescription(s): vitamin c, azelastine, diphenhydramine-acetaminophen, epinephrine, loratadine, meclizine, osteo bi-flex adv triple st, and tramadol-acetaminophen. Current Outpatient Medications on File Prior to Visit  Medication Sig Dispense Refill  . Ascorbic Acid (VITAMIN C) 1000 MG tablet Take 1,000 mg by mouth daily.    Marland Kitchen azelastine (ASTELIN) 0.1 % nasal spray Place 1 spray into both nostrils 2 (two) times daily. Use in each nostril as directed 30 mL 12  . diphenhydramine-acetaminophen (TYLENOL PM) 25-500 MG TABS Take 0.5 tablets by mouth at bedtime as needed.     Marland Kitchen EPINEPHrine (ADRENACLICK) 0.3 MC/9.4 mL IJ SOAJ injection Inject 0.3 mLs (0.3 mg total) into the muscle once. 2 Device 0  . loratadine (CLARITIN) 10 MG tablet Take 1 tablet by mouth once daily 30 tablet 0  . meclizine (ANTIVERT) 25 MG tablet  Take 1 tablet (25 mg total) by mouth 3 (three) times daily as needed for dizziness. 30 tablet 0  . Misc Natural Products (OSTEO BI-FLEX ADV TRIPLE ST) TABS Take 1 tablet by mouth daily.    . traMADol-acetaminophen (ULTRACET) 37.5-325 MG tablet Take 1 tablet by mouth every 8 (eight) hours as needed for severe pain. 30 tablet 0   No current facility-administered medications on file prior to visit.   She is allergic to iohexol and meloxicam..  Review of Systems Review of Systems  Constitutional: Negative for activity change, appetite  change and fatigue.  HENT: Negative for hearing loss, congestion, tinnitus and ear discharge.  dentist q80m Eyes: Negative for visual disturbance (see optho q1y -- vision corrected to 20/20 with glasses).  Respiratory: Negative for cough, chest tightness and shortness of breath.   Cardiovascular: Negative for chest pain, palpitations and leg swelling.  Gastrointestinal: Negative for abdominal pain, diarrhea, constipation and abdominal distention.  Genitourinary: Negative for urgency, frequency, decreased urine volume and difficulty urinating.  Musculoskeletal: Negative for back pain, arthralgias and gait problem.  Skin: Negative for color change, pallor and rash.  Neurological: Negative for dizziness, light-headedness, numbness and headaches.  Hematological: Negative for adenopathy. Does not bruise/bleed easily.  Psychiatric/Behavioral: Negative for suicidal ideas, confusion, sleep disturbance, self-injury, dysphoric mood, decreased concentration and agitation.       Objective:    BP 110/70 (BP Location: Right Arm, Patient Position: Sitting, Cuff Size: Normal)   Pulse 77   Temp 97.8 F (36.6 C) (Oral)   Ht 5' 5.5" (1.664 m)   Wt 165 lb (74.8 kg)   SpO2 97%   BMI 27.04 kg/m  General appearance: alert, cooperative, appears stated age and no distress Head: Normocephalic, without obvious abnormality, atraumatic Eyes: conjunctivae/corneas clear. PERRL, EOM's intact. Fundi benign. Ears: normal TM's and external ear canals both ears Nose: Nares normal. Septum midline. Mucosa normal. No drainage or sinus tenderness. Throat: lips, mucosa, and tongue normal; teeth and gums normal Neck: no adenopathy, no carotid bruit, no JVD, supple, symmetrical, trachea midline and thyroid not enlarged, symmetric, no tenderness/mass/nodules Back: symmetric, no curvature. ROM normal. No CVA tenderness. Lungs: clear to auscultation bilaterally Heart: regular rate and rhythm, S1, S2 normal, no murmur, click,  rub or gallop Abdomen: soft, non-tender; bowel sounds normal; no masses,  no organomegaly Pelvic: not indicated; post-menopausal, no abnormal Pap smears in past Extremities: extremities normal, atraumatic, no cyanosis or edema Pulses: 2+ and symmetric Skin: Skin color, texture, turgor normal. No rashes or lesions Lymph nodes: Cervical, supraclavicular, and axillary nodes normal. Neurologic: Alert and oriented X 3, normal strength and tone. Normal symmetric reflexes. Normal coordination and gait    Assessment:    Healthy female exam.      Plan:    ghm utd Check labs See After Visit Summary for Counseling Recommendations    1. Estrogen deficiency  - DG Bone Density; Future - Comprehensive metabolic panel  2. Ischemic heart disease screen  - Lipid panel - CBC with Differential/Platelet - Comprehensive metabolic panel  3. Preventative health care ghm utd check labs

## 2020-12-24 LAB — COMPREHENSIVE METABOLIC PANEL
ALT: 18 U/L (ref 0–35)
AST: 22 U/L (ref 0–37)
Albumin: 4.7 g/dL (ref 3.5–5.2)
Alkaline Phosphatase: 61 U/L (ref 39–117)
BUN: 25 mg/dL — ABNORMAL HIGH (ref 6–23)
CO2: 28 mEq/L (ref 19–32)
Calcium: 9.6 mg/dL (ref 8.4–10.5)
Chloride: 103 mEq/L (ref 96–112)
Creatinine, Ser: 0.54 mg/dL (ref 0.40–1.20)
GFR: 91.94 mL/min (ref >60.00)
Glucose, Bld: 105 mg/dL — ABNORMAL HIGH (ref 70–99)
Potassium: 4 mEq/L (ref 3.5–5.1)
Sodium: 141 mEq/L (ref 135–145)
Total Bilirubin: 0.5 mg/dL (ref 0.2–1.2)
Total Protein: 7 g/dL (ref 6.0–8.3)

## 2020-12-24 LAB — CBC WITH DIFFERENTIAL/PLATELET
Basophils Absolute: 0.1 10*3/uL (ref 0.0–0.1)
Basophils Relative: 0.9 % (ref 0.0–3.0)
Eosinophils Absolute: 0.1 10*3/uL (ref 0.0–0.7)
Eosinophils Relative: 1.5 % (ref 0.0–5.0)
HCT: 42.3 % (ref 36.0–46.0)
Hemoglobin: 14.1 g/dL (ref 12.0–15.0)
Lymphocytes Relative: 24.1 % (ref 12.0–46.0)
Lymphs Abs: 1.6 10*3/uL (ref 0.7–4.0)
MCHC: 33.4 g/dL (ref 30.0–36.0)
MCV: 93 fl (ref 78.0–100.0)
Monocytes Absolute: 0.5 10*3/uL (ref 0.1–1.0)
Monocytes Relative: 8.2 % (ref 3.0–12.0)
Neutro Abs: 4.2 10*3/uL (ref 1.4–7.7)
Neutrophils Relative %: 65.3 % (ref 43.0–77.0)
Platelets: 185 10*3/uL (ref 150.0–400.0)
RBC: 4.55 Mil/uL (ref 3.87–5.11)
RDW: 13.7 % (ref 11.5–15.5)
WBC: 6.5 10*3/uL (ref 4.0–10.5)

## 2020-12-24 LAB — LIPID PANEL
Cholesterol: 199 mg/dL (ref 0–200)
HDL: 61.4 mg/dL (ref >39.00)
LDL Cholesterol: 120 mg/dL — ABNORMAL HIGH (ref 0–99)
NonHDL: 138.09
Total CHOL/HDL Ratio: 3
Triglycerides: 89 mg/dL (ref 0.0–149.0)
VLDL: 17.8 mg/dL (ref 0.0–40.0)

## 2020-12-26 ENCOUNTER — Other Ambulatory Visit: Payer: Self-pay | Admitting: Family Medicine

## 2020-12-26 DIAGNOSIS — E785 Hyperlipidemia, unspecified: Secondary | ICD-10-CM

## 2020-12-26 DIAGNOSIS — R739 Hyperglycemia, unspecified: Secondary | ICD-10-CM

## 2021-01-03 DIAGNOSIS — H40019 Open angle with borderline findings, low risk, unspecified eye: Secondary | ICD-10-CM | POA: Diagnosis not present

## 2021-01-03 DIAGNOSIS — H524 Presbyopia: Secondary | ICD-10-CM | POA: Diagnosis not present

## 2021-01-18 ENCOUNTER — Other Ambulatory Visit: Payer: Self-pay | Admitting: Family Medicine

## 2021-01-18 DIAGNOSIS — E2839 Other primary ovarian failure: Secondary | ICD-10-CM

## 2021-01-24 DIAGNOSIS — Z0100 Encounter for examination of eyes and vision without abnormal findings: Secondary | ICD-10-CM | POA: Diagnosis not present

## 2021-01-28 ENCOUNTER — Other Ambulatory Visit: Payer: Self-pay

## 2021-01-28 ENCOUNTER — Ambulatory Visit
Admission: RE | Admit: 2021-01-28 | Discharge: 2021-01-28 | Disposition: A | Discharge status: Home / Self Care | Payer: Medicare HMO | Source: Ambulatory Visit | Attending: Family Medicine | Admitting: Family Medicine

## 2021-01-28 DIAGNOSIS — Z1231 Encounter for screening mammogram for malignant neoplasm of breast: Secondary | ICD-10-CM

## 2021-02-01 ENCOUNTER — Other Ambulatory Visit: Payer: Self-pay | Admitting: Family Medicine

## 2021-02-01 DIAGNOSIS — R928 Other abnormal and inconclusive findings on diagnostic imaging of breast: Secondary | ICD-10-CM

## 2021-02-22 ENCOUNTER — Other Ambulatory Visit: Payer: Self-pay

## 2021-02-22 ENCOUNTER — Ambulatory Visit
Admission: RE | Admit: 2021-02-22 | Discharge: 2021-02-22 | Disposition: A | Discharge status: Home / Self Care | Payer: Medicare HMO | Source: Ambulatory Visit | Attending: Family Medicine | Admitting: Family Medicine

## 2021-02-22 DIAGNOSIS — R928 Other abnormal and inconclusive findings on diagnostic imaging of breast: Secondary | ICD-10-CM | POA: Diagnosis not present

## 2021-02-22 DIAGNOSIS — N6324 Unspecified lump in the left breast, lower inner quadrant: Secondary | ICD-10-CM | POA: Diagnosis not present

## 2021-06-30 ENCOUNTER — Telehealth: Payer: Self-pay | Admitting: Family Medicine

## 2021-06-30 NOTE — Telephone Encounter (Signed)
Left message for patient to call back and schedule Medicare Annual Wellness Visit (AWV) in office.   If not able to come in office, please offer to do virtually or by telephone.  Left office number and my jabber [phone].  Last AWV:06/12/2019  Please schedule at anytime with Nurse Health Advisor.

## 2021-07-04 ENCOUNTER — Other Ambulatory Visit: Payer: Self-pay

## 2021-07-04 ENCOUNTER — Ambulatory Visit
Admission: RE | Admit: 2021-07-04 | Discharge: 2021-07-04 | Disposition: A | Discharge status: Home / Self Care | Payer: Medicare HMO | Source: Ambulatory Visit | Attending: Family Medicine | Admitting: Family Medicine

## 2021-07-04 DIAGNOSIS — E2839 Other primary ovarian failure: Secondary | ICD-10-CM

## 2021-07-04 DIAGNOSIS — Z780 Asymptomatic menopausal state: Secondary | ICD-10-CM | POA: Diagnosis not present

## 2021-07-04 DIAGNOSIS — M8589 Other specified disorders of bone density and structure, multiple sites: Secondary | ICD-10-CM | POA: Diagnosis not present

## 2021-07-06 ENCOUNTER — Encounter: Payer: Self-pay | Admitting: Family Medicine

## 2021-07-06 MED ORDER — ALENDRONATE SODIUM 70 MG PO TABS: 70.0000 mg | ORAL_TABLET | ORAL | 3 refills | Status: DC

## 2021-08-13 DIAGNOSIS — M25551 Pain in right hip: Secondary | ICD-10-CM | POA: Diagnosis not present

## 2021-08-13 DIAGNOSIS — M25521 Pain in right elbow: Secondary | ICD-10-CM | POA: Diagnosis not present

## 2021-08-13 DIAGNOSIS — M25511 Pain in right shoulder: Secondary | ICD-10-CM | POA: Diagnosis not present

## 2021-08-15 DIAGNOSIS — M25511 Pain in right shoulder: Secondary | ICD-10-CM | POA: Diagnosis not present

## 2021-08-15 DIAGNOSIS — M25521 Pain in right elbow: Secondary | ICD-10-CM | POA: Diagnosis not present

## 2021-09-14 DIAGNOSIS — M25511 Pain in right shoulder: Secondary | ICD-10-CM | POA: Diagnosis not present

## 2021-09-14 DIAGNOSIS — S42291D Other displaced fracture of upper end of right humerus, subsequent encounter for fracture with routine healing: Secondary | ICD-10-CM | POA: Diagnosis not present

## 2021-10-04 DIAGNOSIS — M25611 Stiffness of right shoulder, not elsewhere classified: Secondary | ICD-10-CM | POA: Diagnosis not present

## 2021-10-05 ENCOUNTER — Encounter: Payer: Self-pay | Admitting: Family Medicine

## 2021-10-24 DIAGNOSIS — S42291D Other displaced fracture of upper end of right humerus, subsequent encounter for fracture with routine healing: Secondary | ICD-10-CM | POA: Diagnosis not present

## 2021-11-03 ENCOUNTER — Encounter: Payer: Self-pay | Admitting: Family Medicine

## 2021-11-15 ENCOUNTER — Ambulatory Visit (INDEPENDENT_AMBULATORY_CARE_PROVIDER_SITE_OTHER): Payer: Medicare HMO | Admitting: Family Medicine

## 2021-11-15 ENCOUNTER — Encounter: Payer: Self-pay | Admitting: Family Medicine

## 2021-11-15 VITALS — BP 110/70 | HR 71 | Temp 98.1°F | Resp 18 | Ht 65.5 in | Wt 172.8 lb

## 2021-11-15 DIAGNOSIS — R635 Abnormal weight gain: Secondary | ICD-10-CM | POA: Diagnosis not present

## 2021-11-15 NOTE — Patient Instructions (Signed)
Preventing Unhealthy Weight Gain, Adult Staying at a healthy weight is important to your overall health. When fat builds up in your body, you may become overweight or obese. Being overweight or obese increases your risk of developing various health problems. Unhealthy weight gain is often the result of making unhealthy food choices or not getting enough exercise. You can make changes to your lifestyle to prevent obesity and stay as healthy as possible. How can unhealthy weight gain affect me? Being overweight or obese can cause you to develop joint or bone problems, which can make it hard for you to stay active or do activities you enjoy. Being overweight also puts stress on your heart and lungs and can lead to health problems such as: Diabetes. Heart disease. Some types of cancer. Stroke. Eating healthy, staying active, and having healthy habits can help to prevent unhealthy weight gain and lower your risk for health problems. These lifestyle changes will also help you manage stress and emotions, improve your self-esteem, and connect with friends and family. What can increase my risk? In addition to certain eating and lifestyle choices, some other factors that may make you more likely to have unhealthy weight gain include: Having a family history of obesity. Living in an area with limited access to: [place], recreation centers, or sidewalks. Healthy food choices, such as grocery stores and farmers' markets. What actions can I take to prevent unhealthy weight gain? Nutrition  Eat only as much as your body needs. To do this: Pay attention to signs that you are hungry or full. Stop eating as soon as you feel full. If you feel hungry, try drinking water first before eating. Drink enough water so your urine is pale yellow. Eat smaller portions. Pay attention to portion sizes when eating out. Look at serving sizes on food labels. Most foods contain more than one serving per container. Eat the  recommended number of calories for your gender and activity level. For most active people, a daily total of 2,000 calories is appropriate. If you are trying to lose weight or are not very active, you may need to eat fewer calories. Talk with your health care provider or a dietitian about how many calories you need each day. Choose healthy foods, such as: Fruits and vegetables. At each meal, try to fill at least half of your plate with fruits and vegetables. Whole grains, such as whole-wheat bread, brown rice, and quinoa. Lean meats, such as chicken or fish. Other healthy proteins, such as beans, eggs, or tofu. Healthy fats, such as nuts, seeds, fatty fish, and olive oil. Low-fat or fat-free dairy products. Check food labels, and avoid food and drinks that: Are high in calories. Have added sugar. Are high in sodium. Have saturated fats or trans fats. Cook foods in healthier ways, such as by baking, broiling, or grilling. Make a meal plan for the week, and shop with a grocery list to help you stay on track with your purchases. Try to avoid going to the grocery store when you are hungry. When grocery shopping, try to shop around the outside of the store first, where the fresh foods are. Doing this helps you avoid prepackaged foods, which can be high in sugar, salt (sodium), and fat. Lifestyle  Exercise for 30 or more minutes on 5 or more days each week. Exercising may include brisk walking, yard work, biking, running, swimming, and team sports like basketball and soccer. Ask your health care provider which exercises are safe for you. Do activities  that strengthen the muscles, such as lifting weights or using resistance bands, on 2 or more days a week. Do not use any products that contain nicotine or tobacco. These products include cigarettes, chewing tobacco, and vaping devices, such as e-cigarettes. If you need help quitting, ask your health care provider. If you drink alcohol: Limit how much you  have to: 0-1 drink a day for women who are not pregnant. 0-2 drinks a day for men. Know how much alcohol is in a drink. In the [place] one drink equals one 12 oz bottle of beer (355 mL), one 5 oz glass of wine (148 mL), or one 1 oz glass of hard liquor (44 mL). Try to get 7-9 hours of sleep each night. Other changes Keep a food and activity journal to keep track of: What you ate and how many calories you had. Remember to count the calories in sauces, dressings, and side dishes. Whether you were active, and what exercises you did. Your calorie, weight, and activity goals. Check your weight regularly. Track any changes. If you notice that you have gained weight, make changes to your diet or activity routine. Avoid taking weight-loss medicines or supplements. Talk to your health care provider before starting any new medicine or supplement. Talk to your health care provider before trying any new diet or exercise plan. Where to find more information Talk with your health care provider or a dietitian about healthy eating and healthy lifestyle choices. You may also find information from: [place] Department of Agriculture, MyPlate: FormerBoss.no American Heart Association: www.heart.org Centers for Disease Control and Prevention: http://www.wolf.info/ Summary Eating healthy, staying active, and having healthy habits can help to prevent unhealthy weight gain and lower your risk for health problems such as heart disease, diabetes, some types of cancer, and stroke. Being overweight or obese can cause you to develop joint or bone problems, which can make it hard for you to stay active or do activities you enjoy. You can prevent unhealthy weight gain by eating a healthy diet, exercising regularly, not smoking, limiting alcohol, and getting enough sleep. Talk with your health care provider or a dietitian for guidance about healthy eating and healthy lifestyle choices. This information is not intended to replace  advice given to you by your health care provider. Make sure you discuss any questions you have with your health care provider. Document Revised: 04/15/2021 Document Reviewed: 04/15/2021 Elsevier Patient Education  [place].

## 2021-11-15 NOTE — Progress Notes (Signed)
Subjective:   By signing my name below, I, Terri Mendoza, attest that this documentation has been prepared under the direction and in the presence of Terri Held, Terri Mendoza  11/15/2021    Patient ID: Terri Mendoza, female    DOB: [birth year], 76 y.o.   MRN: [id number]  Chief Complaint  Patient presents with   Medication Management    Pt states gaining a lot of weight recently and believes it may be related to taking Fosamax.     HPI Patient is in today for a office visit.  She complains of weight gain. She is exercising regularly by walking at least 20,000 steps daily, playing in the pool, playing pickle ball and tennis. She typically weighs 160 lb's and found she gained 10 lb's. She is struggling fitting in her normal clothing and reports her clothes feel tight around her arms and legs. She reports taking 70 mg fosamax every week since October 2022 to manage her osteopenia and reports no new issues while taking it. She also reports her daughter and granddaughter have a history of hashimoto disease and so she thinks she might have a thyroid issue.  Wt Readings from Last 3 Encounters:  11/15/21 172 lb 12.8 oz (78.4 kg)  12/23/20 165 lb (74.8 kg)  06/16/19 170 lb 6.4 oz (77.3 kg)   She is UTD on flu vaccines this year and received it at her pharmacy. She is UTD on shingles vaccines and received it at her pharmacy.     Past Medical History:  Diagnosis Date   Allergy    Atypical chest pain 08/31/2014   persistent CP for 7 days, negative stress echo with EF 65% on 09/01/2014   Heart murmur    Migraines    Osteopenia    Pulmonary nodule 09/02/2014   17mm LUL pulmonary nodule seen on CT    Past Surgical History:  Procedure Laterality Date   COLONOSCOPY     FINGER FRACTURE SURGERY Right 03/08/2019   MOHS SURGERY  02/14/2019   lip--- basal cell    Family History  Problem Relation Age of Onset   Heart disease Mother 39       chf,  valve repair   Cancer Mother 80        colon   Colon cancer Mother 17   Heart disease Father 50       chf   CAD Father    Factor V Leiden deficiency Daughter    Hashimoto's thyroiditis Daughter    Heart failure Other        CHF   Coronary artery disease Other    Hashimoto's thyroiditis Granddaughter     Social History   Socioeconomic History   Marital status: Married    Spouse name: Not on file   Number of children: Not on file   Years of education: Not on file   Highest education level: Not on file  Occupational History   Not on file  Tobacco Use   Smoking status: Never   Smokeless tobacco: Never  Substance and Sexual Activity   Alcohol use: Yes    Alcohol/week: 4.0 standard drinks    Types: 4 Glasses of wine per week   Drug use: No   Sexual activity: Yes    Partners: Male  Other Topics Concern   Not on file  Social History Narrative   Exercise every day   Social Determinants of Health   Financial Resource Strain: Not on file  Food Insecurity:  Not on file  Transportation Needs: Not on file  Physical Activity: Not on file  Stress: Not on file  Social Connections: Not on file  Intimate Partner Violence: Not on file    Outpatient Medications Prior to Visit  Medication Sig Dispense Refill   alendronate (FOSAMAX) 70 MG tablet Take 1 tablet (70 mg total) by mouth once a week. Take with a full glass of water on an empty stomach. 12 tablet 3   Ascorbic Acid (VITAMIN C) 1000 MG tablet Take 1,000 mg by mouth daily.     diphenhydramine-acetaminophen (TYLENOL PM) 25-500 MG TABS Take 0.5 tablets by mouth at bedtime as needed.      EPINEPHrine (ADRENACLICK) 0.3 IR/4.4 mL IJ SOAJ injection Inject 0.3 mLs (0.3 mg total) into the muscle once. 2 Device 0   meclizine (ANTIVERT) 25 MG tablet Take 1 tablet (25 mg total) by mouth 3 (three) times daily as needed for dizziness. 30 tablet 0   Misc Natural Products (OSTEO BI-FLEX ADV TRIPLE ST) TABS Take 1 tablet by mouth daily.     azelastine (ASTELIN) 0.1 % nasal spray  Place 1 spray into both nostrils 2 (two) times daily. Use in each nostril as directed (Patient not taking: Reported on 11/15/2021) 30 mL 12   loratadine (CLARITIN) 10 MG tablet Take 1 tablet by mouth once daily (Patient not taking: Reported on 11/15/2021) 30 tablet 0   traMADol-acetaminophen (ULTRACET) 37.5-325 MG tablet Take 1 tablet by mouth every 8 (eight) hours as needed for severe pain. (Patient not taking: Reported on 11/15/2021) 30 tablet 0   No facility-administered medications prior to visit.    Allergies  Allergen Reactions   Iohexol Hives and Swelling   Meloxicam Hives    Review of Systems  Constitutional:  Negative for fever and malaise/fatigue.  HENT:  Negative for congestion.   Eyes:  Negative for blurred vision.  Respiratory:  Negative for shortness of breath.   Cardiovascular:  Negative for chest pain, palpitations and leg swelling.  Gastrointestinal:  Negative for abdominal pain, blood in stool and nausea.  Genitourinary:  Negative for dysuria and frequency.  Musculoskeletal:  Negative for falls.  Skin:  Negative for rash.  Neurological:  Negative for dizziness, loss of consciousness and headaches.  Endo/Heme/Allergies:  Negative for environmental allergies.  Psychiatric/Behavioral:  Negative for depression. The patient is not nervous/anxious.       Objective:    Physical Exam Vitals and nursing note reviewed.  Constitutional:      General: She is not in acute distress.    Appearance: Normal appearance. She is not ill-appearing.  HENT:     Head: Normocephalic and atraumatic.     Right Ear: External ear normal.     Left Ear: External ear normal.  Eyes:     Extraocular Movements: Extraocular movements intact.     Pupils: Pupils are equal, round, and reactive to light.  Cardiovascular:     Rate and Rhythm: Normal rate and regular rhythm.     Heart sounds: Normal heart sounds. No murmur heard.   No gallop.  Pulmonary:     Effort: Pulmonary effort is normal. No  respiratory distress.     Breath sounds: Normal breath sounds. No wheezing or rales.  Musculoskeletal:        General: No swelling.  Skin:    General: Skin is warm and dry.  Neurological:     Mental Status: She is alert and oriented to person, place, and time.  Psychiatric:  Behavior: Behavior normal.    BP 110/70 (BP Location: Left Arm, Patient Position: Sitting, Cuff Size: Normal)    Pulse 71    Temp 98.1 F (36.7 C) (Oral)    Resp 18    Ht 5' 5.5" (1.664 m)    Wt 172 lb 12.8 oz (78.4 kg)    SpO2 97%    BMI 28.32 kg/m  Wt Readings from Last 3 Encounters:  11/15/21 172 lb 12.8 oz (78.4 kg)  12/23/20 165 lb (74.8 kg)  06/16/19 170 lb 6.4 oz (77.3 kg)    Diabetic Foot Exam - Simple   No data filed    Lab Results  Component Value Date   WBC 6.5 12/23/2020   HGB 14.1 12/23/2020   HCT 42.3 12/23/2020   PLT 185.0 12/23/2020   GLUCOSE 117 (H) 11/15/2021   CHOL 208 (H) 11/15/2021   TRIG 128.0 11/15/2021   HDL 67.40 11/15/2021   LDLCALC 115 (H) 11/15/2021   ALT 20 11/15/2021   AST 30 11/15/2021   NA 139 11/15/2021   K 4.1 11/15/2021   CL 102 11/15/2021   CREATININE 0.51 11/15/2021   BUN 18 11/15/2021   CO2 28 11/15/2021   TSH 2.56 09/20/2018   INR 1.11 09/01/2014   HGBA1C 5.7 06/16/2019    Lab Results  Component Value Date   TSH 2.56 09/20/2018   Lab Results  Component Value Date   WBC 6.5 12/23/2020   HGB 14.1 12/23/2020   HCT 42.3 12/23/2020   MCV 93.0 12/23/2020   PLT 185.0 12/23/2020   Lab Results  Component Value Date   NA 139 11/15/2021   K 4.1 11/15/2021   CO2 28 11/15/2021   GLUCOSE 117 (H) 11/15/2021   BUN 18 11/15/2021   CREATININE 0.51 11/15/2021   BILITOT 0.4 11/15/2021   ALKPHOS 56 11/15/2021   AST 30 11/15/2021   ALT 20 11/15/2021   PROT 7.8 11/15/2021   ALBUMIN 4.7 11/15/2021   CALCIUM 10.0 11/15/2021   ANIONGAP 13 09/01/2014   GFR 92.63 11/15/2021   Lab Results  Component Value Date   CHOL 208 (H) 11/15/2021   Lab  Results  Component Value Date   HDL 67.40 11/15/2021   Lab Results  Component Value Date   LDLCALC 115 (H) 11/15/2021   Lab Results  Component Value Date   TRIG 128.0 11/15/2021   Lab Results  Component Value Date   CHOLHDL 3 11/15/2021   Lab Results  Component Value Date   HGBA1C 5.7 06/16/2019       Assessment & Plan:   Problem List Items Addressed This Visit       Unprioritized   Weight gain - Primary    Doubt its the fosamax Check labs including thyroid        Relevant Orders   Comprehensive metabolic panel (Completed)   Lipid panel (Completed)   Hemoglobin A1c   CBC with Differential/Platelet   Thyroid Panel With TSH   Thyroid peroxidase antibody     No orders of the defined types were placed in this encounter.   I, Terri Held, Terri Mendoza, personally preformed the services described in this documentation.  All medical record entries made by the scribe were at my direction and in my presence.  I have reviewed the chart and discharge instructions (if applicable) and agree that the record reflects my personal performance and is accurate and complete. 11/15/2021   I,Terri Mendoza,acting as a scribe for [employer], Terri Mendoza.,have  documented all relevant documentation on the behalf of Terri Held, Terri Mendoza,as directed by  Terri Held, Terri Mendoza while in the presence of Terri Held, Terri Mendoza.   Terri Held, Terri Mendoza

## 2021-11-16 DIAGNOSIS — R635 Abnormal weight gain: Secondary | ICD-10-CM | POA: Insufficient documentation

## 2021-11-16 LAB — LIPID PANEL
Cholesterol: 208 mg/dL — ABNORMAL HIGH (ref 0–200)
HDL: 67.4 mg/dL (ref >39.00)
LDL Cholesterol: 115 mg/dL — ABNORMAL HIGH (ref 0–99)
NonHDL: 140.48
Total CHOL/HDL Ratio: 3
Triglycerides: 128 mg/dL (ref 0.0–149.0)
VLDL: 25.6 mg/dL (ref 0.0–40.0)

## 2021-11-16 LAB — COMPREHENSIVE METABOLIC PANEL
ALT: 20 U/L (ref 0–35)
AST: 30 U/L (ref 0–37)
Albumin: 4.7 g/dL (ref 3.5–5.2)
Alkaline Phosphatase: 56 U/L (ref 39–117)
BUN: 18 mg/dL (ref 6–23)
CO2: 28 mEq/L (ref 19–32)
Calcium: 10 mg/dL (ref 8.4–10.5)
Chloride: 102 mEq/L (ref 96–112)
Creatinine, Ser: 0.51 mg/dL (ref 0.40–1.20)
GFR: 92.63 mL/min (ref >60.00)
Glucose, Bld: 117 mg/dL — ABNORMAL HIGH (ref 70–99)
Potassium: 4.1 mEq/L (ref 3.5–5.1)
Sodium: 139 mEq/L (ref 135–145)
Total Bilirubin: 0.4 mg/dL (ref 0.2–1.2)
Total Protein: 7.8 g/dL (ref 6.0–8.3)

## 2021-11-16 NOTE — Assessment & Plan Note (Signed)
Doubt its the fosamax Check labs including thyroid

## 2021-11-17 ENCOUNTER — Other Ambulatory Visit: Payer: Medicare HMO

## 2021-11-22 ENCOUNTER — Other Ambulatory Visit: Payer: Self-pay | Admitting: *Deleted

## 2021-11-22 ENCOUNTER — Other Ambulatory Visit (INDEPENDENT_AMBULATORY_CARE_PROVIDER_SITE_OTHER): Payer: Medicare HMO

## 2021-11-22 DIAGNOSIS — R635 Abnormal weight gain: Secondary | ICD-10-CM | POA: Diagnosis not present

## 2021-11-22 NOTE — Progress Notes (Signed)
Pt here for redraw. No charge.

## 2021-11-23 LAB — CBC WITH DIFFERENTIAL/PLATELET
Basophils Absolute: 0 10*3/uL (ref 0.0–0.1)
Basophils Relative: 0.7 % (ref 0.0–3.0)
Eosinophils Absolute: 0.1 10*3/uL (ref 0.0–0.7)
Eosinophils Relative: 0.9 % (ref 0.0–5.0)
HCT: 42.8 % (ref 36.0–46.0)
Hemoglobin: 14.1 g/dL (ref 12.0–15.0)
Lymphocytes Relative: 16.8 % (ref 12.0–46.0)
Lymphs Abs: 1.1 10*3/uL (ref 0.7–4.0)
MCHC: 32.8 g/dL (ref 30.0–36.0)
MCV: 94 fl (ref 78.0–100.0)
Monocytes Absolute: 0.5 10*3/uL (ref 0.1–1.0)
Monocytes Relative: 7.6 % (ref 3.0–12.0)
Neutro Abs: 4.9 10*3/uL (ref 1.4–7.7)
Neutrophils Relative %: 74 % (ref 43.0–77.0)
Platelets: 187 10*3/uL (ref 150.0–400.0)
RBC: 4.56 Mil/uL (ref 3.87–5.11)
RDW: 13.6 % (ref 11.5–15.5)
WBC: 6.6 10*3/uL (ref 4.0–10.5)

## 2021-11-23 LAB — HEMOGLOBIN A1C: Hgb A1c MFr Bld: 5.8 % (ref 4.6–6.5)

## 2021-11-23 LAB — THYROID PANEL WITH TSH
Free Thyroxine Index: 2.1 (ref 1.4–3.8)
T3 Uptake: 29 % (ref 22–35)
T4, Total: 7.3 ug/dL (ref 5.1–11.9)
TSH: 2.34 mIU/L (ref 0.40–4.50)

## 2021-11-23 LAB — THYROID PEROXIDASE ANTIBODY: Thyroperoxidase Ab SerPl-aCnc: <1 IU/mL (ref <9)

## 2021-11-25 ENCOUNTER — Encounter: Payer: Self-pay | Admitting: Family Medicine

## 2021-11-28 DIAGNOSIS — D1801 Hemangioma of skin and subcutaneous tissue: Secondary | ICD-10-CM | POA: Diagnosis not present

## 2021-11-28 DIAGNOSIS — Z85828 Personal history of other malignant neoplasm of skin: Secondary | ICD-10-CM | POA: Diagnosis not present

## 2021-11-28 DIAGNOSIS — L820 Inflamed seborrheic keratosis: Secondary | ICD-10-CM | POA: Diagnosis not present

## 2021-11-28 DIAGNOSIS — L738 Other specified follicular disorders: Secondary | ICD-10-CM | POA: Diagnosis not present

## 2021-11-28 DIAGNOSIS — L821 Other seborrheic keratosis: Secondary | ICD-10-CM | POA: Diagnosis not present

## 2021-11-28 DIAGNOSIS — L814 Other melanin hyperpigmentation: Secondary | ICD-10-CM | POA: Diagnosis not present

## 2021-11-28 DIAGNOSIS — B351 Tinea unguium: Secondary | ICD-10-CM | POA: Diagnosis not present

## 2021-11-28 DIAGNOSIS — L218 Other seborrheic dermatitis: Secondary | ICD-10-CM | POA: Diagnosis not present

## 2021-11-30 ENCOUNTER — Encounter: Payer: Self-pay | Admitting: Family Medicine

## 2021-11-30 ENCOUNTER — Other Ambulatory Visit: Payer: Self-pay | Admitting: Family Medicine

## 2021-11-30 DIAGNOSIS — R635 Abnormal weight gain: Secondary | ICD-10-CM

## 2021-11-30 DIAGNOSIS — Z8349 Family history of other endocrine, nutritional and metabolic diseases: Secondary | ICD-10-CM

## 2021-12-05 ENCOUNTER — Encounter (HOSPITAL_BASED_OUTPATIENT_CLINIC_OR_DEPARTMENT_OTHER): Payer: Self-pay

## 2021-12-05 ENCOUNTER — Emergency Department (HOSPITAL_BASED_OUTPATIENT_CLINIC_OR_DEPARTMENT_OTHER)
Admission: EM | Admit: 2021-12-05 | Discharge: 2021-12-05 | Disposition: A | Discharge status: Home / Self Care | Payer: Medicare HMO | Attending: Emergency Medicine | Admitting: Emergency Medicine

## 2021-12-05 ENCOUNTER — Other Ambulatory Visit: Payer: Self-pay

## 2021-12-05 DIAGNOSIS — Y9373 Activity, racquet and hand sports: Secondary | ICD-10-CM | POA: Diagnosis not present

## 2021-12-05 DIAGNOSIS — S01511A Laceration without foreign body of lip, initial encounter: Secondary | ICD-10-CM | POA: Diagnosis not present

## 2021-12-05 DIAGNOSIS — S0993XA Unspecified injury of face, initial encounter: Secondary | ICD-10-CM | POA: Diagnosis not present

## 2021-12-05 DIAGNOSIS — S80811A Abrasion, right lower leg, initial encounter: Secondary | ICD-10-CM

## 2021-12-05 DIAGNOSIS — W1830XA Fall on same level, unspecified, initial encounter: Secondary | ICD-10-CM | POA: Insufficient documentation

## 2021-12-05 DIAGNOSIS — W19XXXA Unspecified fall, initial encounter: Secondary | ICD-10-CM

## 2021-12-05 NOTE — ED Provider Notes (Addendum)
?[facility] ?Provider Note ? ? ?CSN: [id number] ?Arrival date & time: 12/05/21  1233 ? ?  ? ?History ? ?Chief Complaint  ?Patient presents with  ? Fall  ? ? ?Terri Mendoza is a 76 y.o. female. ? ? ?Fall ? ? ?76 year old female presenting to the emergency department after a fall.  The patient states that she was walking when she tripped and fell chin first onto concrete sustaining an abrasion to her right knee in addition to a laceration to her anterior mouth along her lower lip.  Fall was sustained 1 hour prior to arrival with no loss of consciousness.  She is not on anticoagulation.  She denies any neck pain or headache at this time.  She was finally concerned that she might need stitches in her lip.  She arrived GCS 15, ABC intact.  She states that her tetanus is up-to-date. ? ?Home Medications ?Prior to Admission medications   ?Medication Sig Start Date End Date Taking? Authorizing Provider  ?alendronate (FOSAMAX) 70 MG tablet Take 1 tablet (70 mg total) by mouth once a week. Take with a full glass of water on an empty stomach. 07/06/21   [name], DO  ?Ascorbic Acid (VITAMIN C) 1000 MG tablet Take 1,000 mg by mouth daily.    [provider]  ?diphenhydramine-acetaminophen (TYLENOL PM) 25-500 MG TABS Take 0.5 tablets by mouth at bedtime as needed.     [provider]  ?EPINEPHrine (ADRENACLICK) 0.3 XY/8.0 mL IJ SOAJ injection Inject 0.3 mLs (0.3 mg total) into the muscle once. 02/04/16   [name], DO  ?meclizine (ANTIVERT) 25 MG tablet Take 1 tablet (25 mg total) by mouth 3 (three) times daily as needed for dizziness. 09/20/18   [name], DO  ?Misc Natural Products (OSTEO BI-FLEX ADV TRIPLE ST) TABS Take 1 tablet by mouth daily.    [provider]  ?   ? ?Allergies    ?Iohexol and Meloxicam   ? ?Review of Systems   ?Review of Systems  ?Skin:  Positive for wound.  ?All other systems reviewed and are  negative. ? ?Physical Exam ?Updated Vital Signs ?BP 137/87 (BP Location: Right Arm)   Pulse 65   Temp (!) 96.9 ?F (36.1 ?C) (Temporal)   Resp 16   Ht 5' 5.5" (1.664 m)   Wt 78.4 kg   SpO2 100%   BMI 28.32 kg/m?  ?Physical Exam ?Vitals and nursing note reviewed.  ?Constitutional:   ?   General: She is not in acute distress. ?   Appearance: She is well-developed.  ?   Comments: GCS 15, ABC intact  ?HENT:  ?   Head: Normocephalic and atraumatic.  ?   Mouth/Throat:  ?   Comments: Inferior puncture like lip laceration along the patient's inner lower lip, no through and through laceration, no active bleeding ?Eyes:  ?   Extraocular Movements: Extraocular movements intact.  ?   Conjunctiva/sclera: Conjunctivae normal.  ?   Pupils: Pupils are equal, round, and reactive to light.  ?Neck:  ?   Comments: No midline tenderness to palpation of the cervical spine.  Range of motion intact ?Cardiovascular:  ?   Rate and Rhythm: Normal rate and regular rhythm.  ?   Heart sounds: No murmur heard. ?Pulmonary:  ?   Effort: Pulmonary effort is normal. No respiratory distress.  ?   Breath sounds: Normal breath sounds.  ?Chest:  ?   Comments: Clavicles stable nontender  to AP compression.  Chest wall stable and nontender to AP and lateral compression. ?Abdominal:  ?   Palpations: Abdomen is soft.  ?   Tenderness: There is no abdominal tenderness.  ?Musculoskeletal:  ?   Cervical back: Neck supple.  ?   Comments: No midline tenderness to palpation of the thoracic or lumbar spine.  Extremities atraumatic with intact range of motion.  Abrasion to the right lower extremity with no tenderness to palpation of the right knee with intact range of motion  ?Skin: ?   General: Skin is warm and dry.  ?Neurological:  ?   Mental Status: She is alert.  ?   Comments: Cranial nerves II through XII grossly intact.  Moving all 4 extremities spontaneously.  Sensation grossly intact all 4 extremities  ? ? ?ED Results / Procedures / Treatments    ?Labs ?(all labs ordered are listed, but only abnormal results are displayed) ?Labs Reviewed - No data to display ? ?EKG ?None ? ?Radiology ?No results found. ? ?Procedures ?Procedures  ? ? ?Medications Ordered in ED ?Medications - No data to display ? ?ED Course/ Medical Decision Making/ A&P ?  ?                        ?Medical Decision Making ? ?76 year old female presenting to the emergency department after a fall.  The patient states that she was walking when she tripped and fell chin first onto concrete sustaining an abrasion to her right knee in addition to a laceration to her anterior mouth along her lower lip.  Fall was sustained 1 hour prior to arrival with no loss of consciousness.  She is not on anticoagulation.  She denies any neck pain or headache at this time.  She was finally concerned that she might need stitches in her lip.  She arrived GCS 15, ABC intact.  She states that her tetanus is up-to-date. ? ?On arrival, the patient was vitally stable.  Physical exam significant for an abrasion to the lower extremity and a laceration to the inner lip that is not through and through.  Discussed CT imaging of the head and cervical spine and given reassuring exam, patient elected to defer CT imaging at this time.  It is hemostatic at this time.  I discussed suture repair versus allowing the wound to heal on its own.  The patient elected to allow the wound to heal by secondary intention given the high vascularity and quick healing of the mucosa of the inner lip.  Advised continued management with saline rinses and provided return precautions. ? ?Final Clinical Impression(s) / ED Diagnoses ?Final diagnoses:  ?Fall, initial encounter  ?Lip laceration, initial encounter  ?Abrasion of right lower extremity, initial encounter  ? ? ?Rx / DC Orders ?ED Discharge Orders   ? ? None  ? ?  ? ? ?  ?Regan Lemming, MD ?12/06/21 1330 ? ?  ?Regan Lemming, MD ?12/06/21 1330 ? ?  ?Regan Lemming, MD ?12/06/21 1332 ? ?

## 2021-12-05 NOTE — ED Triage Notes (Signed)
Patient here POV from Home with Fall. ? ?Patient fell while playing tennis when she fell chin-first into the Concrete approximately 1 hour PTA. Small Laceration to [employer].  ? ?No History of Blood Thinning Medications. No LOC. No Known Head Injury. ? ?NAD Noted during Triage. A&Ox4. GCS 15. Ambulatory.  ?

## 2021-12-22 DIAGNOSIS — R7309 Other abnormal glucose: Secondary | ICD-10-CM | POA: Diagnosis not present

## 2021-12-22 DIAGNOSIS — M8580 Other specified disorders of bone density and structure, unspecified site: Secondary | ICD-10-CM | POA: Diagnosis not present

## 2021-12-22 DIAGNOSIS — R635 Abnormal weight gain: Secondary | ICD-10-CM | POA: Diagnosis not present

## 2022-01-26 ENCOUNTER — Telehealth: Payer: Self-pay | Admitting: Family Medicine

## 2022-01-26 NOTE — Telephone Encounter (Signed)
Left message for patient to call back and schedule Medicare Annual Wellness Visit (AWV).  ? ?Please offer to do virtually or by telephone.  Left office number and my jabber [phone]. ? ?Last AWV:06/12/2019 ? ?Please schedule at anytime with Nurse Health Advisor. ?  ?

## 2022-01-30 ENCOUNTER — Other Ambulatory Visit: Payer: Self-pay | Admitting: Family Medicine

## 2022-01-30 DIAGNOSIS — Z1231 Encounter for screening mammogram for malignant neoplasm of breast: Secondary | ICD-10-CM

## 2022-01-31 ENCOUNTER — Ambulatory Visit (INDEPENDENT_AMBULATORY_CARE_PROVIDER_SITE_OTHER): Payer: Medicare HMO | Admitting: Family Medicine

## 2022-01-31 ENCOUNTER — Encounter: Payer: Self-pay | Admitting: Family Medicine

## 2022-01-31 VITALS — BP 104/80 | HR 75 | Temp 97.5°F | Resp 16 | Ht 65.5 in | Wt 169.4 lb

## 2022-01-31 DIAGNOSIS — Z91030 Bee allergy status: Secondary | ICD-10-CM

## 2022-01-31 DIAGNOSIS — Z136 Encounter for screening for cardiovascular disorders: Secondary | ICD-10-CM | POA: Diagnosis not present

## 2022-01-31 DIAGNOSIS — Z0000 Encounter for general adult medical examination without abnormal findings: Secondary | ICD-10-CM | POA: Diagnosis not present

## 2022-01-31 MED ORDER — EPINEPHRINE 0.3 MG/0.3ML IJ SOAJ: 0.3000 mg | Freq: Once | INTRAMUSCULAR | 0 refills | Status: AC

## 2022-01-31 NOTE — Progress Notes (Signed)
? ?Subjective:  ? ?By signing my name below, I, Terri Mendoza, attest that this documentation has been prepared under the direction and in the presence of [name], DO. 01/31/2022 ? ? Patient ID: Terri Mendoza, female    DOB: 03/10/1946, 76 y.o.   MRN: [id number] ? ?Chief Complaint  ?Patient presents with  ? Annual Exam  ?  Pt states not fasting   ? ? ?HPI ?Patient is in today for a comprehensive physical exam. ? ?She is UTD on endocrinology. She was told there was no reason for her weight gain. Adds she has been losing it gradually since the last visit.  ? ?She is exercising frequently by playing pickle ball and tennis. She also walks about 3 miles a day. She is also managing a healthy diet.  ? ?She denies fever, hearing loss, ear pain,congestion, sinus pain, sore throat, eye pain, chest pain, palpitations, cough, shortness of breath, wheezing, nausea. vomiting, diarrhea, constipation, blood in stool, dysuria,frequency, hematuria and headaches.  ? ?She is UTD on shingles vaccines. Not UTD on tetanus vaccine. She has 4 Covid-19 vaccines at this time.  ? ?She is requesting for a refill on Epipens. She is allergic to bee stings ? ?Past Medical History:  ?Diagnosis Date  ? Allergy   ? Atypical chest pain 08/31/2014  ? persistent CP for 7 days, negative stress echo with EF 65% on 09/01/2014  ? Heart murmur   ? Migraines   ? Osteopenia   ? Pulmonary nodule 09/02/2014  ? 24m LUL pulmonary nodule seen on CT  ? ? ?Past Surgical History:  ?Procedure Laterality Date  ? COLONOSCOPY    ? FINGER FRACTURE SURGERY Right 03/08/2019  ? MOHS SURGERY  02/14/2019  ? lip--- basal cell  ? ? ?Family History  ?Problem Relation Age of Onset  ? Cancer Mother 733 ?     colon  ? Heart disease Mother 71 ?     chf,  valve repair  ? Colon cancer Mother 751 ? Heart disease Father 547 ?     chf  ? CAD Father   ? Factor V Leiden deficiency Daughter   ? Hashimoto's thyroiditis Daughter   ? Hashimoto's thyroiditis Granddaughter   ? Heart  failure Other   ?     CHF  ? Coronary artery disease Other   ? ? ?Social History  ? ?Socioeconomic History  ? Marital status: Married  ?  Spouse name: Not on file  ? Number of children: Not on file  ? Years of education: Not on file  ? Highest education level: Not on file  ?Occupational History  ? Not on file  ?Tobacco Use  ? Smoking status: Never  ? Smokeless tobacco: Never  ?Substance and Sexual Activity  ? Alcohol use: Yes  ?  Alcohol/week: 4.0 standard drinks  ?  Types: 4 Glasses of wine per week  ? Drug use: No  ? Sexual activity: Yes  ?  Partners: Male  ?Other Topics Concern  ? Not on file  ?Social History Narrative  ? Exercise every day  ? ?Social Determinants of Health  ? ?Financial Resource Strain: Not on file  ?Food Insecurity: Not on file  ?Transportation Needs: Not on file  ?Physical Activity: Not on file  ?Stress: Not on file  ?Social Connections: Not on file  ?Intimate Partner Violence: Not on file  ? ? ?Outpatient Medications Prior to Visit  ?Medication Sig Dispense Refill  ? alendronate (FOSAMAX)  70 MG tablet Take 1 tablet (70 mg total) by mouth once a week. Take with a full glass of water on an empty stomach. 12 tablet 3  ? Ascorbic Acid (VITAMIN C) 1000 MG tablet Take 1,000 mg by mouth daily.    ? diphenhydramine-acetaminophen (TYLENOL PM) 25-500 MG TABS Take 0.5 tablets by mouth at bedtime as needed.     ? meclizine (ANTIVERT) 25 MG tablet Take 1 tablet (25 mg total) by mouth 3 (three) times daily as needed for dizziness. 30 tablet 0  ? Misc Natural Products (OSTEO BI-FLEX ADV TRIPLE ST) TABS Take 1 tablet by mouth daily.    ? EPINEPHrine (ADRENACLICK) 0.3 KD/9.8 mL IJ SOAJ injection Inject 0.3 mLs (0.3 mg total) into the muscle once. 2 Device 0  ? ?No facility-administered medications prior to visit.  ? ? ?Allergies  ?Allergen Reactions  ? Iohexol Hives and Swelling  ? Meloxicam Hives  ? ? ?Review of Systems  ?Constitutional:  Negative for fever.  ?HENT:  Negative for congestion, ear pain,  hearing loss, sinus pain and sore throat.   ?Eyes:  Negative for blurred vision and pain.  ?Respiratory:  Negative for cough, sputum production, shortness of breath and wheezing.   ?Cardiovascular:  Negative for chest pain and palpitations.  ?Gastrointestinal:  Negative for blood in stool, constipation, diarrhea, nausea and vomiting.  ?Genitourinary:  Negative for dysuria, frequency, hematuria and urgency.  ?Musculoskeletal:  Negative for back pain, falls and myalgias.  ?Neurological:  Negative for dizziness, sensory change, loss of consciousness, weakness and headaches.  ?Endo/Heme/Allergies:  Negative for environmental allergies. Does not bruise/bleed easily.  ?Psychiatric/Behavioral:  Negative for depression and suicidal ideas. The patient is not nervous/anxious and does not have insomnia.   ? ?   ?Objective:  ?  ?Physical Exam ?Constitutional:   ?   General: She is not in acute distress. ?   Appearance: Normal appearance. She is not ill-appearing.  ?HENT:  ?   Head: Normocephalic and atraumatic.  ?   Right Ear: Tympanic membrane, ear canal and external ear normal.  ?   Left Ear: Tympanic membrane, ear canal and external ear normal.  ?Eyes:  ?   Extraocular Movements: Extraocular movements intact.  ?   Pupils: Pupils are equal, round, and reactive to light.  ?Cardiovascular:  ?   Rate and Rhythm: Normal rate and regular rhythm.  ?   Pulses: Normal pulses.  ?   Heart sounds: Normal heart sounds. No murmur heard. ?  No gallop.  ?Pulmonary:  ?   Effort: Pulmonary effort is normal. No respiratory distress.  ?   Breath sounds: Normal breath sounds. No wheezing, rhonchi or rales.  ?Abdominal:  ?   General: Bowel sounds are normal. There is no distension.  ?   Palpations: Abdomen is soft. There is no mass.  ?   Tenderness: There is no abdominal tenderness. There is no guarding or rebound.  ?   Hernia: No hernia is present.  ?Musculoskeletal:  ?   Cervical back: Normal range of motion and neck supple.  ?Lymphadenopathy:   ?   Cervical: No cervical adenopathy.  ?Skin: ?   General: Skin is warm and dry.  ?Neurological:  ?   Mental Status: She is alert and oriented to person, place, and time.  ?Psychiatric:     ?   Behavior: Behavior normal.  ?Ekg-- nsr,  non sp t wave changes  ? ?BP 104/80 (BP Location: Left Arm, Patient Position: Sitting, Cuff Size: Normal)  Pulse 75   Temp (!) 97.5 ?F (36.4 ?C) (Oral)   Resp 16   Ht 5' 5.5" (1.664 m)   Wt 169 lb 6.4 oz (76.8 kg)   SpO2 96%   BMI 27.76 kg/m?  ?Wt Readings from Last 3 Encounters:  ?01/31/22 169 lb 6.4 oz (76.8 kg)  ?12/05/21 172 lb 13.5 oz (78.4 kg)  ?11/15/21 172 lb 12.8 oz (78.4 kg)  ? ? ?Diabetic Foot Exam - Simple   ?No data filed ?  ? ?Lab Results  ?Component Value Date  ? WBC 7.3 01/31/2022  ? HGB 14.2 01/31/2022  ? HCT 43.3 01/31/2022  ? PLT 188.0 01/31/2022  ? GLUCOSE 94 01/31/2022  ? CHOL 182 01/31/2022  ? TRIG 121.0 01/31/2022  ? HDL 60.20 01/31/2022  ? [place] 97 01/31/2022  ? ALT 14 01/31/2022  ? AST 21 01/31/2022  ? NA 140 01/31/2022  ? K 4.1 01/31/2022  ? CL 103 01/31/2022  ? CREATININE 0.69 01/31/2022  ? BUN 22 01/31/2022  ? CO2 29 01/31/2022  ? TSH 2.34 11/22/2021  ? INR 1.11 09/01/2014  ? HGBA1C 5.8 11/22/2021  ? ? ?Lab Results  ?Component Value Date  ? TSH 2.34 11/22/2021  ? ?Lab Results  ?Component Value Date  ? WBC 7.3 01/31/2022  ? HGB 14.2 01/31/2022  ? HCT 43.3 01/31/2022  ? MCV 94.1 01/31/2022  ? PLT 188.0 01/31/2022  ? ?Lab Results  ?Component Value Date  ? NA 140 01/31/2022  ? K 4.1 01/31/2022  ? CO2 29 01/31/2022  ? GLUCOSE 94 01/31/2022  ? BUN 22 01/31/2022  ? CREATININE 0.69 01/31/2022  ? BILITOT 0.5 01/31/2022  ? ALKPHOS 49 01/31/2022  ? AST 21 01/31/2022  ? ALT 14 01/31/2022  ? PROT 6.6 01/31/2022  ? ALBUMIN 4.4 01/31/2022  ? CALCIUM 9.3 01/31/2022  ? ANIONGAP 13 09/01/2014  ? GFR 85.99 01/31/2022  ? ?Lab Results  ?Component Value Date  ? CHOL 182 01/31/2022  ? ?Lab Results  ?Component Value Date  ? HDL 60.20 01/31/2022  ? ?Lab Results   ?Component Value Date  ? [place] 97 01/31/2022  ? ?Lab Results  ?Component Value Date  ? TRIG 121.0 01/31/2022  ? ?Lab Results  ?Component Value Date  ? CHOLHDL 3 01/31/2022  ? ?Lab Results  ?Component Value Date  ? HGBA1

## 2022-01-31 NOTE — Patient Instructions (Signed)
Preventive Care 7 Years and Older, Female ?Preventive care refers to lifestyle choices and visits with your health care provider that can promote health and wellness. Preventive care visits are also called wellness exams. ?What can I expect for my preventive care visit? ?Counseling ?Your health care provider may ask you questions about your: ?Medical history, including: ?Past medical problems. ?Family medical history. ?Pregnancy and menstrual history. ?History of falls. ?Current health, including: ?Memory and ability to understand (cognition). ?Emotional well-being. ?Home life and relationship well-being. ?Sexual activity and sexual health. ?Lifestyle, including: ?Alcohol, nicotine or tobacco, and drug use. ?Access to firearms. ?Diet, exercise, and sleep habits. ?Work and work Statistician. ?Sunscreen use. ?Safety issues such as seatbelt and bike helmet use. ?Physical exam ?Your health care provider will check your: ?Height and weight. These may be used to calculate your BMI (body mass index). BMI is a measurement that tells if you are at a healthy weight. ?Waist circumference. This measures the distance around your waistline. This measurement also tells if you are at a healthy weight and may help predict your risk of certain diseases, such as type 2 diabetes and high blood pressure. ?Heart rate and blood pressure. ?Body temperature. ?Skin for abnormal spots. ?What immunizations do I need? ? ?Vaccines are usually given at various ages, according to a schedule. Your health care provider will recommend vaccines for you based on your age, medical history, and lifestyle or other factors, such as travel or where you work. ?What tests do I need? ?Screening ?Your health care provider may recommend screening tests for certain conditions. This may include: ?Lipid and cholesterol levels. ?Hepatitis C test. ?Hepatitis B test. ?HIV (human immunodeficiency virus) test. ?STI (sexually transmitted infection) testing, if you are at  risk. ?Lung cancer screening. ?Colorectal cancer screening. ?Diabetes screening. This is done by checking your blood sugar (glucose) after you have not eaten for a while (fasting). ?Mammogram. Talk with your health care provider about how often you should have regular mammograms. ?BRCA-related cancer screening. This may be done if you have a family history of breast, ovarian, tubal, or peritoneal cancers. ?Bone density scan. This is done to screen for osteoporosis. ?Talk with your health care provider about your test results, treatment options, and if necessary, the need for more tests. ?Follow these instructions at home: ?Eating and drinking ? ?Eat a diet that includes fresh fruits and vegetables, whole grains, lean protein, and low-fat dairy products. Limit your intake of foods with high amounts of sugar, saturated fats, and salt. ?Take vitamin and mineral supplements as recommended by your health care provider. ?Do not drink alcohol if your health care provider tells you not to drink. ?If you drink alcohol: ?Limit how much you have to 0-1 drink a day. ?Know how much alcohol is in your drink. In the [place] one drink equals one 12 oz bottle of beer (355 mL), one 5 oz glass of wine (148 mL), or one 1? oz glass of hard liquor (44 mL). ?Lifestyle ?Brush your teeth every morning and night with fluoride toothpaste. Floss one time each day. ?Exercise for at least 30 minutes 5 or more days each week. ?Do not use any products that contain nicotine or tobacco. These products include cigarettes, chewing tobacco, and vaping devices, such as e-cigarettes. If you need help quitting, ask your health care provider. ?Do not use drugs. ?If you are sexually active, practice safe sex. Use a condom or other form of protection in order to prevent STIs. ?Take aspirin only as told by  your health care provider. Make sure that you understand how much to take and what form to take. Work with your health care provider to find out whether it  is safe and beneficial for you to take aspirin daily. ?Ask your health care provider if you need to take a cholesterol-lowering medicine (statin). ?Find healthy ways to manage stress, such as: ?Meditation, yoga, or listening to music. ?Journaling. ?Talking to a trusted person. ?Spending time with friends and family. ?Minimize exposure to UV radiation to reduce your risk of skin cancer. ?Safety ?Always wear your seat belt while driving or riding in a vehicle. ?Do not drive: ?If you have been drinking alcohol. Do not ride with someone who has been drinking. ?When you are tired or distracted. ?While texting. ?If you have been using any mind-altering substances or drugs. ?Wear a helmet and other protective equipment during sports activities. ?If you have firearms in your house, make sure you follow all gun safety procedures. ?What's next? ?Visit your health care provider once a year for an annual wellness visit. ?Ask your health care provider how often you should have your eyes and teeth checked. ?Stay up to date on all vaccines. ?This information is not intended to replace advice given to you by your health care provider. Make sure you discuss any questions you have with your health care provider. ?Document Revised: 03/16/2021 Document Reviewed: 03/16/2021 ?Elsevier Patient Education ? [place]. ? ?

## 2022-02-01 LAB — CBC WITH DIFFERENTIAL/PLATELET
Basophils Absolute: 0.1 10*3/uL (ref 0.0–0.1)
Basophils Relative: 1 % (ref 0.0–3.0)
Eosinophils Absolute: 0.1 10*3/uL (ref 0.0–0.7)
Eosinophils Relative: 0.9 % (ref 0.0–5.0)
HCT: 43.3 % (ref 36.0–46.0)
Hemoglobin: 14.2 g/dL (ref 12.0–15.0)
Lymphocytes Relative: 19.2 % (ref 12.0–46.0)
Lymphs Abs: 1.4 10*3/uL (ref 0.7–4.0)
MCHC: 32.9 g/dL (ref 30.0–36.0)
MCV: 94.1 fl (ref 78.0–100.0)
Monocytes Absolute: 0.6 10*3/uL (ref 0.1–1.0)
Monocytes Relative: 8.8 % (ref 3.0–12.0)
Neutro Abs: 5.1 10*3/uL (ref 1.4–7.7)
Neutrophils Relative %: 70.1 % (ref 43.0–77.0)
Platelets: 188 10*3/uL (ref 150.0–400.0)
RBC: 4.6 Mil/uL (ref 3.87–5.11)
RDW: 13.9 % (ref 11.5–15.5)
WBC: 7.3 10*3/uL (ref 4.0–10.5)

## 2022-02-01 LAB — LIPID PANEL
Cholesterol: 182 mg/dL (ref 0–200)
HDL: 60.2 mg/dL (ref >39.00)
LDL Cholesterol: 97 mg/dL (ref 0–99)
NonHDL: 121.43
Total CHOL/HDL Ratio: 3
Triglycerides: 121 mg/dL (ref 0.0–149.0)
VLDL: 24.2 mg/dL (ref 0.0–40.0)

## 2022-02-01 LAB — COMPREHENSIVE METABOLIC PANEL
ALT: 14 U/L (ref 0–35)
AST: 21 U/L (ref 0–37)
Albumin: 4.4 g/dL (ref 3.5–5.2)
Alkaline Phosphatase: 49 U/L (ref 39–117)
BUN: 22 mg/dL (ref 6–23)
CO2: 29 mEq/L (ref 19–32)
Calcium: 9.3 mg/dL (ref 8.4–10.5)
Chloride: 103 mEq/L (ref 96–112)
Creatinine, Ser: 0.69 mg/dL (ref 0.40–1.20)
GFR: 85.99 mL/min (ref >60.00)
Glucose, Bld: 94 mg/dL (ref 70–99)
Potassium: 4.1 mEq/L (ref 3.5–5.1)
Sodium: 140 mEq/L (ref 135–145)
Total Bilirubin: 0.5 mg/dL (ref 0.2–1.2)
Total Protein: 6.6 g/dL (ref 6.0–8.3)

## 2022-02-06 NOTE — Assessment & Plan Note (Signed)
ghm utd Check labs  See avs  

## 2022-02-08 ENCOUNTER — Ambulatory Visit
Admission: RE | Admit: 2022-02-08 | Discharge: 2022-02-08 | Disposition: A | Discharge status: Home / Self Care | Payer: Medicare HMO | Source: Ambulatory Visit | Attending: Family Medicine | Admitting: Family Medicine

## 2022-02-08 DIAGNOSIS — Z1231 Encounter for screening mammogram for malignant neoplasm of breast: Secondary | ICD-10-CM | POA: Diagnosis not present

## 2022-02-20 ENCOUNTER — Ambulatory Visit (INDEPENDENT_AMBULATORY_CARE_PROVIDER_SITE_OTHER): Payer: Medicare HMO

## 2022-02-20 VITALS — Ht 65.0 in | Wt 165.0 lb

## 2022-02-20 DIAGNOSIS — Z0000 Encounter for general adult medical examination without abnormal findings: Secondary | ICD-10-CM | POA: Diagnosis not present

## 2022-02-20 NOTE — Patient Instructions (Signed)
Terri Mendoza , Thank you for taking time to complete your Medicare Wellness Visit. I appreciate your ongoing commitment to your health goals. Please review the following plan we discussed and let me know if I can assist you in the future.   Screening recommendations/referrals: Colonoscopy: Completed 04/30/2013-Due 05/01/2023 Mammogram: Completed 02/08/2022-Due 02/09/2023 Bone Density: Completed 07/04/2021-Due 07/05/2023 Recommended yearly ophthalmology/optometry visit for glaucoma screening and checkup Recommended yearly dental visit for hygiene and checkup  Vaccinations: Influenza vaccine: Up to date Pneumococcal vaccine: Up to date Tdap vaccine: Due-May obtain vaccine at your local pharmacy. Shingles vaccine: Completed vaccines   Covid-19:Up to date  Advanced directives: May pick up information at our office.  Conditions/risks identified: See problem list  Next appointment: Follow up in one year for your annual wellness visit    Preventive Care 65 Years and Older, Female Preventive care refers to lifestyle choices and visits with your health care provider that can promote health and wellness. What does preventive care include? A yearly physical exam. This is also called an annual well check. Dental exams once or twice a year. Routine eye exams. Ask your health care provider how often you should have your eyes checked. Personal lifestyle choices, including: Daily care of your teeth and gums. Regular physical activity. Eating a healthy diet. Avoiding tobacco and drug use. Limiting alcohol use. Practicing safe sex. Taking low-dose aspirin every day. Taking vitamin and mineral supplements as recommended by your health care provider. What happens during an annual well check? The services and screenings done by your health care provider during your annual well check will depend on your age, overall health, lifestyle risk factors, and family history of disease. Counseling  Your  health care provider may ask you questions about your: Alcohol use. Tobacco use. Drug use. Emotional well-being. Home and relationship well-being. Sexual activity. Eating habits. History of falls. Memory and ability to understand (cognition). Work and work Statistician. Reproductive health. Screening  You may have the following tests or measurements: Height, weight, and BMI. Blood pressure. Lipid and cholesterol levels. These may be checked every 5 years, or more frequently if you are over 74 years old. Skin check. Lung cancer screening. You may have this screening every year starting at age 74 if you have a 30-pack-year history of smoking and currently smoke or have quit within the past 15 years. Fecal occult blood test (FOBT) of the stool. You may have this test every year starting at age 74. Flexible sigmoidoscopy or colonoscopy. You may have a sigmoidoscopy every 5 years or a colonoscopy every 10 years starting at age 74. Hepatitis C blood test. Hepatitis B blood test. Sexually transmitted disease (STD) testing. Diabetes screening. This is done by checking your blood sugar (glucose) after you have not eaten for a while (fasting). You may have this done every 1-3 years. Bone density scan. This is done to screen for osteoporosis. You may have this done starting at age 74. Mammogram. This may be done every 1-2 years. Talk to your health care provider about how often you should have regular mammograms. Talk with your health care provider about your test results, treatment options, and if necessary, the need for more tests. Vaccines  Your health care provider may recommend certain vaccines, such as: Influenza vaccine. This is recommended every year. Tetanus, diphtheria, and acellular pertussis (Tdap, Td) vaccine. You may need a Td booster every 10 years. Zoster vaccine. You may need this after age 74. Pneumococcal 13-valent conjugate (PCV13) vaccine. One dose is recommended  after age  74. Pneumococcal polysaccharide (PPSV23) vaccine. One dose is recommended after age 74. Talk to your health care provider about which screenings and vaccines you need and how often you need them. This information is not intended to replace advice given to you by your health care provider. Make sure you discuss any questions you have with your health care provider. Document Released: 10/15/2015 Document Revised: 06/07/2016 Document Reviewed: 07/20/2015 Elsevier Interactive Patient Education  2017 [place] Prevention in the Home Falls can cause injuries. They can happen to people of all ages. There are many things you can do to make your home safe and to help prevent falls. What can I do on the outside of my home? Regularly fix the edges of walkways and driveways and fix any cracks. Remove anything that might make you trip as you walk through a door, such as a raised step or threshold. Trim any bushes or trees on the path to your home. Use bright outdoor lighting. Clear any walking paths of anything that might make someone trip, such as rocks or tools. Regularly check to see if handrails are loose or broken. Make sure that both sides of any steps have handrails. Any raised decks and porches should have guardrails on the edges. Have any leaves, snow, or ice cleared regularly. Use sand or salt on walking paths during winter. Clean up any spills in your garage right away. This includes oil or grease spills. What can I do in the bathroom? Use night lights. Install grab bars by the toilet and in the tub and shower. Do not use towel bars as grab bars. Use non-skid mats or decals in the tub or shower. If you need to sit down in the shower, use a plastic, non-slip stool. Keep the floor dry. Clean up any water that spills on the floor as soon as it happens. Remove soap buildup in the tub or shower regularly. Attach bath mats securely with double-sided non-slip rug tape. Do not have throw  rugs and other things on the floor that can make you trip. What can I do in the bedroom? Use night lights. Make sure that you have a light by your bed that is easy to reach. Do not use any sheets or blankets that are too big for your bed. They should not hang down onto the floor. Have a firm chair that has side arms. You can use this for support while you get dressed. Do not have throw rugs and other things on the floor that can make you trip. What can I do in the kitchen? Clean up any spills right away. Avoid walking on wet floors. Keep items that you use a lot in easy-to-reach places. If you need to reach something above you, use a strong step stool that has a grab bar. Keep electrical cords out of the way. Do not use floor polish or wax that makes floors slippery. If you must use wax, use non-skid floor wax. Do not have throw rugs and other things on the floor that can make you trip. What can I do with my stairs? Do not leave any items on the stairs. Make sure that there are handrails on both sides of the stairs and use them. Fix handrails that are broken or loose. Make sure that handrails are as long as the stairways. Check any carpeting to make sure that it is firmly attached to the stairs. Fix any carpet that is loose or worn. Avoid having throw  rugs at the top or bottom of the stairs. If you do have throw rugs, attach them to the floor with carpet tape. Make sure that you have a light switch at the top of the stairs and the bottom of the stairs. If you do not have them, ask someone to add them for you. What else can I do to help prevent falls? Wear shoes that: Do not have high heels. Have rubber bottoms. Are comfortable and fit you well. Are closed at the toe. Do not wear sandals. If you use a stepladder: Make sure that it is fully opened. Do not climb a closed stepladder. Make sure that both sides of the stepladder are locked into place. Ask someone to hold it for you, if  possible. Clearly mark and make sure that you can see: Any grab bars or handrails. First and last steps. Where the edge of each step is. Use tools that help you move around (mobility aids) if they are needed. These include: Canes. Walkers. Scooters. Crutches. Turn on the lights when you go into a dark area. Replace any light bulbs as soon as they burn out. Set up your furniture so you have a clear path. Avoid moving your furniture around. If any of your floors are uneven, fix them. If there are any pets around you, be aware of where they are. Review your medicines with your doctor. Some medicines can make you feel dizzy. This can increase your chance of falling. Ask your doctor what other things that you can do to help prevent falls. This information is not intended to replace advice given to you by your health care provider. Make sure you discuss any questions you have with your health care provider. Document Released: 07/15/2009 Document Revised: 02/24/2016 Document Reviewed: 10/23/2014 Elsevier Interactive Patient Education  2017 [employer].

## 2022-02-20 NOTE — Progress Notes (Signed)
Subjective:   Terri Mendoza is a 76 y.o. female who presents for Medicare Annual (Subsequent) preventive examination.  I connected with Terri Mendoza today by telephone and verified that I am speaking with the correct person using two identifiers. Location patient: home Location provider: work Persons participating in the virtual visit: patient, Marine scientist.    I discussed the limitations, risks, security and privacy concerns of performing an evaluation and management service by telephone and the availability of in person appointments. I also discussed with the patient that there may be a patient responsible charge related to this service. The patient expressed understanding and verbally consented to this telephonic visit.    Interactive audio and video telecommunications were attempted between this provider and patient, however failed, due to patient having technical difficulties OR patient did not have access to video capability.  We continued and completed visit with audio only.  Some vital signs may be absent or patient reported.   Time Spent with patient on telephone encounter: 20 minutes   Review of Systems     Cardiac Risk Factors include: advanced age (>2mn, >>4women)     Objective:    Today's Vitals   02/20/22 1430  Weight: 165 lb (74.8 kg)  Height: '5\' 5"'$  (1.651 m)   Body mass index is 27.46 kg/m.     02/20/2022    2:33 PM 12/05/2021   12:52 PM 06/12/2019    2:51 PM 06/04/2018    2:25 PM 01/31/2016    3:56 PM 05/10/2015    3:14 PM 08/31/2014   12:22 PM  Advanced Directives  Does Patient Have a Medical Advance Directive? No No Yes Yes Yes Yes No  Type of AScientist, physiologicalof ANorth StarLiving will Living will [place] will [place] will   Does patient want to make changes to medical advance directive?   No - Patient declined  No - Patient declined    Copy of HPrestonin Chart?   No -  copy requested  No - copy requested    Would patient like information on creating a medical advance directive?  No - Patient declined     No - patient declined information    Current Medications (verified) Outpatient Encounter Medications as of 02/20/2022  Medication Sig   alendronate (FOSAMAX) 70 MG tablet Take 1 tablet (70 mg total) by mouth once a week. Take with a full glass of water on an empty stomach.   Ascorbic Acid (VITAMIN C) 1000 MG tablet Take 1,000 mg by mouth daily.   diphenhydramine-acetaminophen (TYLENOL PM) 25-500 MG TABS Take 0.5 tablets by mouth at bedtime as needed.    meclizine (ANTIVERT) 25 MG tablet Take 1 tablet (25 mg total) by mouth 3 (three) times daily as needed for dizziness.   Misc Natural Products (OSTEO BI-FLEX ADV TRIPLE ST) TABS Take 1 tablet by mouth daily.   No facility-administered encounter medications on file as of 02/20/2022.    Allergies (verified) Iohexol and Meloxicam   History: Past Medical History:  Diagnosis Date   Allergy    Atypical chest pain 08/31/2014   persistent CP for 7 days, negative stress echo with EF 65% on 09/01/2014   Heart murmur    Migraines    Osteopenia    Pulmonary nodule 09/02/2014   448mLUL pulmonary nodule seen on CT   Past Surgical History:  Procedure Laterality Date   COLONOSCOPY     FINGER FRACTURE  SURGERY Right 03/08/2019   MOHS SURGERY  02/14/2019   lip--- basal cell   Family History  Problem Relation Age of Onset   Cancer Mother 87       colon   Heart disease Mother 9       chf,  valve repair   Colon cancer Mother 77   Heart disease Father 2       chf   CAD Father    Factor V Leiden deficiency Daughter    Hashimoto's thyroiditis Daughter    Hashimoto's thyroiditis Granddaughter    Heart failure Other        CHF   Coronary artery disease Other    Social History   Socioeconomic History   Marital status: Married    Spouse name: Not on file   Number of children: Not on file   Years of  education: Not on file   Highest education level: Not on file  Occupational History   Not on file  Tobacco Use   Smoking status: Never   Smokeless tobacco: Never  Substance and Sexual Activity   Alcohol use: Yes    Alcohol/week: 4.0 standard drinks    Types: 4 Glasses of wine per week   Drug use: No   Sexual activity: Yes    Partners: Male  Other Topics Concern   Not on file  Social History Narrative   Exercise every day   Social Determinants of Health   Financial Resource Strain: Low Risk    Difficulty of Paying Living Expenses: Not hard at all  Food Insecurity: No Food Insecurity   Worried About Charity fundraiser in the Last Year: Never true   [place] in the Last Year: Never true  Transportation Needs: No Transportation Needs   Lack of Transportation (Medical): No   Lack of Transportation (Non-Medical): No  Physical Activity: Sufficiently Active   Days of Exercise per Week: 7 days   Minutes of Exercise per Session: 60 min  Stress: No Stress Concern Present   Feeling of Stress : Not at all  Social Connections: Moderately Integrated   Frequency of Communication with Friends and Family: More than three times a week   Frequency of Social Gatherings with Friends and Family: More than three times a week   Attends Religious Services: Never   Marine scientist or Organizations: Yes   Attends Music therapist: 1 to 4 times per year   Marital Status: Married    Tobacco Counseling Counseling given: Not Answered   Clinical Intake:  Pre-visit preparation completed: Yes  Pain : No/denies pain     BMI - recorded: 27.46 Nutritional Status: BMI 25 -29 Overweight Nutritional Risks: None Diabetes: No  How often do you need to have someone help you when you read instructions, pamphlets, or other written materials from your doctor or pharmacy?: 1 - Never  Diabetic?No  Interpreter Needed?: No  Information entered by :: Caroleen Hamman  LPN   Activities of Daily Living    02/20/2022    2:36 PM  In your present state of health, do you have any difficulty performing the following activities:  Hearing? 0  Vision? 0  Difficulty concentrating or making decisions? 0  Walking or climbing stairs? 0  Dressing or bathing? 0  Doing errands, shopping? 0  Preparing Food and eating ? N  Using the Toilet? N  In the past six months, have you accidently leaked urine? N  Do you have  problems with loss of bowel control? N  Managing your Medications? N  Managing your Finances? N  Housekeeping or managing your Housekeeping? N    Patient Care Team: Carollee Herter, Alferd Apa, DO as PCP - General Harriett Sine, MD as Consulting Physician (Dermatology) Odette Fraction [facility])  Indicate any recent Medical Services you may have received from other than [facility] providers in the past year (date may be approximate).     Assessment:   This is a routine wellness examination for Galia.  Hearing/Vision screen Hearing Screening - Comments:: C/o mild hearing loss Vision Screening - Comments:: Last eye exam-03/2021-Dr. Joya San  Dietary issues and exercise activities discussed: Current Exercise Habits: Home exercise routine, Type of exercise: strength training/weights;walking (water fitness), Time (Minutes): 60, Frequency (Times/Week): 7, Weekly Exercise (Minutes/Week): 420, Intensity: Mild   Goals Addressed             This Visit's Progress    Maintain healthy active lifestyle.   On track      Depression Screen    02/20/2022    2:36 PM 01/31/2022    2:52 PM 12/23/2020    2:50 PM 06/12/2019    2:53 PM 12/27/2017    3:53 PM 01/31/2016    3:59 PM 01/31/2016    3:08 PM  PHQ 2/9 Scores  PHQ - 2 Score 0 0 0 0 0 0 0    Fall Risk    02/20/2022    2:35 PM 01/31/2022    2:52 PM 12/23/2020    2:50 PM 06/12/2019    2:53 PM 12/27/2017    3:53 PM  [place] in the past year? 1 1 0 0 No  Number falls in past yr: 0 0 0    Injury  with Fall? 1 1 0    Risk for fall due to : History of fall(s) History of fall(s)     Follow up Falls prevention discussed Falls evaluation completed Falls evaluation completed      [place]:  Any stairs in or around the home? No  Home free of loose throw rugs in walkways, pet beds, electrical cords, etc? Yes  Adequate lighting in your home to reduce risk of falls? Yes   ASSISTIVE DEVICES UTILIZED TO PREVENT FALLS:  Life alert? No  Use of a cane, walker or w/c? No  Grab bars in the bathroom? Yes  Shower chair or bench in shower? Yes  Elevated toilet seat or a handicapped toilet? No   TIMED UP AND GO:  Was the test performed? No . Phone visit   Cognitive Function:Normal cognitive status assessed by this Nurse Health Advisor. No abnormalities found.          Immunizations Immunization History  Administered Date(s) Administered   Influenza,inj,Quad PF,6+ Mos 07/02/2014   PFIZER(Purple Top)SARS-COV-2 Vaccination 10/23/2019, 11/12/2019, 07/28/2020   Pfizer Covid-19 Vaccine Bivalent Booster 59yr & up 07/24/2021   Pneumococcal Conjugate-13 10/26/2014   Pneumococcal Polysaccharide-23 01/31/2016   Td 01/17/2006   Zoster Recombinat (Shingrix) 10/14/2021   Zoster, Live 08/07/2011    TDAP status: Due, Education has been provided regarding the importance of this vaccine. Advised may receive this vaccine at local pharmacy or Health Dept. Aware to provide a copy of the vaccination record if obtained from local pharmacy or Health Dept. Verbalized acceptance and understanding.  Flu Vaccine status: Up to date  Pneumococcal vaccine status: Up to date  Covid-19 vaccine status: Completed vaccines  Qualifies for Shingles  Vaccine? No   Zostavax completed Yes   Shingrix Completed?: Yes  Screening Tests Health Maintenance  Topic Date Due   TETANUS/TDAP  01/18/2016   Zoster Vaccines- Shingrix (2 of 2) 12/09/2021   INFLUENZA VACCINE  05/02/2022    MAMMOGRAM  02/09/2023   COLONOSCOPY (Pts 45-101yr Insurance coverage will need to be confirmed)  05/01/2023   Pneumonia Vaccine 76 Years old  Completed   DEXA SCAN  Completed   COVID-19 Vaccine  Completed   Hepatitis C Screening  Completed   HPV VACCINES  Aged Out    Health Maintenance  Health Maintenance Due  Topic Date Due   TETANUS/TDAP  01/18/2016   Zoster Vaccines- Shingrix (2 of 2) 12/09/2021    Colorectal cancer screening: Type of screening: Colonoscopy. Completed 04/30/2013. Repeat every 10 years  Mammogram status: Completed bilateral 02/08/2022. Repeat every year  Bone Density status: Completed 07/04/2021. Results reflect: Bone density results: OSTEOPENIA. Repeat every 2 years.  Lung Cancer Screening: (Low Dose CT Chest recommended if Age 74-80years, 30 pack-year currently smoking OR have quit w/in 15years.) does not qualify.     Additional Screening:  Hepatitis C Screening: Completed 03/22/2016  Vision Screening: Recommended annual ophthalmology exams for early detection of glaucoma and other disorders of the eye. Is the patient up to date with their annual eye exam?  Yes  Who is the provider or what is the name of the office in which the patient attends annual eye exams? Dr. TJoya San  Dental Screening: Recommended annual dental exams for proper oral hygiene  Community Resource Referral / Chronic Care Management: CRR required this visit?  No   CCM required this visit?  No      Plan:     I have personally reviewed and noted the following in the patient's chart:   Medical and social history Use of alcohol, tobacco or illicit drugs  Current medications and supplements including opioid prescriptions.  Functional ability and status Nutritional status Physical activity Advanced directives List of other physicians Hospitalizations, surgeries, and ER visits in previous 12 months Vitals Screenings to include cognitive, depression, and falls Referrals and  appointments  In addition, I have reviewed and discussed with patient certain preventive protocols, quality metrics, and best practice recommendations. A written personalized care plan for preventive services as well as general preventive health recommendations were provided to patient.   Due to this being a telephonic visit, the after visit summary with patients personalized plan was offered to patient via mail or my-chart. Patient would like to access on my-chart.   MMarta Antu LPN   [id number] Nurse health Advisor  Nurse Notes: None

## 2022-05-21 ENCOUNTER — Other Ambulatory Visit: Payer: Self-pay | Admitting: Family Medicine

## 2022-05-21 ENCOUNTER — Encounter: Payer: Self-pay | Admitting: Family Medicine

## 2022-05-21 DIAGNOSIS — R42 Dizziness and giddiness: Secondary | ICD-10-CM

## 2022-05-22 MED ORDER — ALENDRONATE SODIUM 70 MG PO TABS: 70.0000 mg | ORAL_TABLET | ORAL | 3 refills | Status: DC

## 2022-05-22 MED ORDER — MECLIZINE HCL 25 MG PO TABS: 25.0000 mg | ORAL_TABLET | Freq: Three times a day (TID) | ORAL | 0 refills | Status: AC | PRN

## 2022-07-18 DIAGNOSIS — H524 Presbyopia: Secondary | ICD-10-CM | POA: Diagnosis not present

## 2022-08-09 ENCOUNTER — Encounter: Payer: Self-pay | Admitting: Family Medicine

## 2022-08-17 ENCOUNTER — Other Ambulatory Visit: Payer: Self-pay

## 2022-08-17 DIAGNOSIS — R300 Dysuria: Secondary | ICD-10-CM

## 2022-08-18 ENCOUNTER — Encounter: Payer: Self-pay | Admitting: Family Medicine

## 2022-08-18 ENCOUNTER — Ambulatory Visit: Payer: Medicare HMO | Admitting: Family

## 2022-08-18 ENCOUNTER — Other Ambulatory Visit: Payer: Medicare HMO

## 2022-08-18 ENCOUNTER — Telehealth (INDEPENDENT_AMBULATORY_CARE_PROVIDER_SITE_OTHER): Payer: Medicare HMO | Admitting: Family Medicine

## 2022-08-18 ENCOUNTER — Other Ambulatory Visit (INDEPENDENT_AMBULATORY_CARE_PROVIDER_SITE_OTHER): Payer: Medicare HMO

## 2022-08-18 DIAGNOSIS — R300 Dysuria: Secondary | ICD-10-CM

## 2022-08-18 DIAGNOSIS — N3000 Acute cystitis without hematuria: Secondary | ICD-10-CM

## 2022-08-18 LAB — POCT URINALYSIS DIPSTICK
Bilirubin, UA: NEGATIVE
Blood, UA: POSITIVE
Glucose, UA: NEGATIVE
Ketones, UA: NEGATIVE
Nitrite, UA: NEGATIVE
Protein, UA: NEGATIVE
Spec Grav, UA: 1.01 (ref 1.010–1.025)
Urobilinogen, UA: 0.2 E.U./dL
pH, UA: 6 (ref 5.0–8.0)

## 2022-08-18 MED ORDER — NITROFURANTOIN MONOHYD MACRO 100 MG PO CAPS: 100.0000 mg | ORAL_CAPSULE | Freq: Two times a day (BID) | ORAL | 0 refills | Status: AC

## 2022-08-18 NOTE — Progress Notes (Signed)
Chief Complaint  Patient presents with   Dysuria    Urine odor    Terri Mendoza is a 76 y.o. female here for possible UTI. Due to COVID-19 pandemic, we are interacting via web portal for an electronic face-to-face visit. I verified patient's ID using 2 identifiers. Patient agreed to proceed with visit via this method. Patient is at home, I am at office. Patient and I are present for visit.   Duration: 8 days. Symptoms: Dysuria, foul odor, urinary retention and freq Denies: hematuria, urinary hesitancy, fever, nausea, vomiting, urinary incontinence, and urgency, vaginal discharge Hx of recurrent UTI? No Denies new sexual partners.  Past Medical History:  Diagnosis Date   Allergy    Atypical chest pain 08/31/2014   persistent CP for 7 days, negative stress echo with EF 65% on 09/01/2014   Heart murmur    Migraines    Osteopenia    Pulmonary nodule 09/02/2014   24m LUL pulmonary nodule seen on CT    Objective No conversational dyspnea Age appropriate judgment and insight Nml affect and mood  Acute cystitis without hematuria - Plan: nitrofurantoin, macrocrystal-monohydrate, (MACROBID) 100 MG capsule  Renal function reviewed. 5 d of Macrobid.  Stay hydrated. Seek immediate care if pt starts to develop fevers, new/worsening symptoms, uncontrollable N/V. F/u prn. Total time spent: 7 min The patient voiced understanding and agreement to the plan.  [place] DO 08/18/22 12:38 PM

## 2022-08-18 NOTE — Patient Instructions (Signed)
Health Maintenance Due  Topic Date Due   COVID-19 Vaccine (5 - Pfizer series) 11/24/2021   Zoster Vaccines- Shingrix (2 of 2) 12/09/2021   INFLUENZA VACCINE  05/02/2022      Row Labels 02/20/2022    2:36 PM 01/31/2022    2:52 PM 12/23/2020    2:50 PM  Depression screen PHQ 2/9   Section Header. No data exists in this row.     Decreased Interest   0 0 0  Down, Depressed, Hopeless   0 0 0  PHQ - 2 Score   0 0 0

## 2022-08-21 LAB — URINE CULTURE
MICRO NUMBER:: 14205326
SPECIMEN QUALITY:: ADEQUATE

## 2022-08-29 DIAGNOSIS — Z0100 Encounter for examination of eyes and vision without abnormal findings: Secondary | ICD-10-CM | POA: Diagnosis not present

## 2022-09-04 DIAGNOSIS — Z1159 Encounter for screening for other viral diseases: Secondary | ICD-10-CM | POA: Diagnosis not present

## 2022-10-10 DIAGNOSIS — H903 Sensorineural hearing loss, bilateral: Secondary | ICD-10-CM | POA: Diagnosis not present

## 2022-10-28 DIAGNOSIS — R69 Illness, unspecified: Secondary | ICD-10-CM | POA: Diagnosis not present

## 2022-11-16 DIAGNOSIS — R69 Illness, unspecified: Secondary | ICD-10-CM | POA: Diagnosis not present

## 2022-11-29 DIAGNOSIS — Z85828 Personal history of other malignant neoplasm of skin: Secondary | ICD-10-CM | POA: Diagnosis not present

## 2022-11-29 DIAGNOSIS — D692 Other nonthrombocytopenic purpura: Secondary | ICD-10-CM | POA: Diagnosis not present

## 2022-11-29 DIAGNOSIS — D2272 Melanocytic nevi of left lower limb, including hip: Secondary | ICD-10-CM | POA: Diagnosis not present

## 2022-11-29 DIAGNOSIS — L814 Other melanin hyperpigmentation: Secondary | ICD-10-CM | POA: Diagnosis not present

## 2022-11-29 DIAGNOSIS — B351 Tinea unguium: Secondary | ICD-10-CM | POA: Diagnosis not present

## 2022-11-29 DIAGNOSIS — L821 Other seborrheic keratosis: Secondary | ICD-10-CM | POA: Diagnosis not present

## 2022-12-18 DIAGNOSIS — R69 Illness, unspecified: Secondary | ICD-10-CM | POA: Diagnosis not present

## 2023-01-15 DIAGNOSIS — R69 Illness, unspecified: Secondary | ICD-10-CM | POA: Diagnosis not present

## 2023-02-22 NOTE — Progress Notes (Signed)
Subjective:   By signing my name below, I, Terri Mendoza, attest that this documentation has been prepared under the direction and in the presence of Seabron Spates R, DO. 02/23/2023.   Patient ID: Terri Mendoza, female    DOB: 03-02-1946, 77 y.o.   MRN: [id number]  Chief Complaint  Patient presents with   Annual Exam    Pt states fasting     HPI Patient is in today for a comprehensive physical exam.  She complains of more easily bruising which she attributes to her age. She notes a mild bruise of unknown etiology on her left forearm. Otherwise she denies any dermatological concerns. Recently had a full-body examination with her dermatologist which was reportedly normal.  She now has hearing aids, which seem to be helping.  Social history:  No new changes to her family medical history. No new surgeries or procedures in the prior year. Colonoscopy:  Last completed 04/30/2013. She states she will be due for a colonoscopy in 04/2023. Dexa:  Last completed 07/04/2021. Pap Smear:  Last completed 10/26/2014. Mammogram:  Last completed 02/08/2022. Immunizations:  Tdap received 01/17/2006.  First Shingrix vaccination received 10/14/2021.  Covid-19 vaccination last received 07/24/2021.  Influenza vaccine last received 07/02/2014.  Pneumonia vaccine received 01/31/2016.  Exercise:  She enjoys hiking in the woods frequently and will prefer to be prepared in case of bee stings. She is therefore requesting refills for her EpiPen. Also she usually completes 20,000 steps a day. She plays tennis, walks, and swims for exercise. Dental/Vision:  She affirms being UTD on her routine examinations.  Denies having any fever, new muscle pain, joint pain, new moles, congestion, sinus pain, sore throat, chest pain, palpitations, cough, SOB, wheezing, n/v/d, constipation, blood in stool, dysuria, frequency, hematuria, at this time.  Past Medical History:  Diagnosis Date   Allergy    Atypical chest pain  08/31/2014   persistent CP for 7 days, negative stress echo with EF 65% on 09/01/2014   Heart murmur    Migraines    Osteopenia    Pulmonary nodule 09/02/2014   4mm LUL pulmonary nodule seen on CT    Past Surgical History:  Procedure Laterality Date   COLONOSCOPY     FINGER FRACTURE SURGERY Right 03/08/2019   MOHS SURGERY  02/14/2019   lip--- basal cell    Family History  Problem Relation Age of Onset   Cancer Mother 38       colon   Heart disease Mother 60       chf,  valve repair   Colon cancer Mother 29   Heart disease Father 32       chf   CAD Father    Factor V Leiden deficiency Daughter    Hashimoto's thyroiditis Daughter    Hashimoto's thyroiditis Granddaughter    Heart failure Other        CHF   Coronary artery disease Other     Social History   Socioeconomic History   Marital status: Married    Spouse name: Not on file   Number of children: Not on file   Years of education: Not on file   Highest education level: Not on file  Occupational History   Not on file  Tobacco Use   Smoking status: Never   Smokeless tobacco: Never  Substance and Sexual Activity   Alcohol use: Yes    Alcohol/week: 4.0 standard drinks of alcohol    Types: 4 Glasses of wine per week  Drug use: No   Sexual activity: Yes    Partners: Male  Other Topics Concern   Not on file  Social History Narrative   Exercise every day   Social Determinants of Health   Financial Resource Strain: Low Risk  (02/20/2022)   Overall Financial Resource Strain (CARDIA)    Difficulty of Paying Living Expenses: Not hard at all  Food Insecurity: No Food Insecurity (02/20/2022)   Hunger Vital Sign    Worried About Running Out of Food in the Last Year: Never true    Ran Out of Food in the Last Year: Never true  Transportation Needs: No Transportation Needs (02/20/2022)   PRAPARE - Administrator, Civil Service (Medical): No    Lack of Transportation (Non-Medical): No  Physical Activity:  Sufficiently Active (02/20/2022)   Exercise Vital Sign    Days of Exercise per Week: 7 days    Minutes of Exercise per Session: 60 min  Stress: No Stress Concern Present (02/20/2022)   [employer] of Occupational Health - Occupational Stress Questionnaire    Feeling of Stress : Not at all  Social Connections: Moderately Integrated (02/20/2022)   Social Connection and Isolation Panel [NHANES]    Frequency of Communication with Friends and Family: More than three times a week    Frequency of Social Gatherings with Friends and Family: More than three times a week    Attends Religious Services: Never    Database administrator or Organizations: Yes    Attends Banker Meetings: 1 to 4 times per year    Marital Status: Married  Catering manager Violence: Not At Risk (02/20/2022)   Humiliation, Afraid, Rape, and Kick questionnaire    Fear of Current or Ex-Partner: No    Emotionally Abused: No    Physically Abused: No    Sexually Abused: No    Outpatient Medications Prior to Visit  Medication Sig Dispense Refill   alendronate (FOSAMAX) 70 MG tablet Take 1 tablet (70 mg total) by mouth once a week. Take with a full glass of water on an empty stomach. 12 tablet 3   Ascorbic Acid (VITAMIN C) 1000 MG tablet Take 1,000 mg by mouth daily.     diphenhydramine-acetaminophen (TYLENOL PM) 25-500 MG TABS Take 0.5 tablets by mouth at bedtime as needed.      meclizine (ANTIVERT) 25 MG tablet Take 1 tablet (25 mg total) by mouth 3 (three) times daily as needed for dizziness. 30 tablet 0   Misc Natural Products (OSTEO BI-FLEX ADV TRIPLE ST) TABS Take 1 tablet by mouth daily.     No facility-administered medications prior to visit.    Allergies  Allergen Reactions   Iohexol Hives and Swelling   Meloxicam Hives    Review of Systems  Constitutional:  Negative for fever and malaise/fatigue.  HENT:  Negative for congestion, sinus pain and sore throat.   Eyes:  Negative for blurred  vision.  Respiratory:  Negative for cough, shortness of breath and wheezing.   Cardiovascular:  Negative for chest pain, palpitations and leg swelling.  Gastrointestinal:  Negative for abdominal pain, blood in stool, constipation, diarrhea, nausea and vomiting.  Genitourinary:  Negative for dysuria, frequency and hematuria.  Musculoskeletal:  Negative for falls, joint pain and myalgias.  Skin:  Negative for rash.       (-) New moles.  Neurological:  Negative for dizziness, loss of consciousness and headaches.  Endo/Heme/Allergies:  Negative for environmental allergies. Bruises/bleeds easily (Bruising).  Psychiatric/Behavioral:  Negative for depression. The patient is not nervous/anxious.        Objective:    Physical Exam Vitals and nursing note reviewed.  Constitutional:      Appearance: Normal appearance.  HENT:     Head: Normocephalic and atraumatic.     Right Ear: Tympanic membrane, ear canal and external ear normal.     Left Ear: Tympanic membrane, ear canal and external ear normal.     Nose: Nose normal.     Mouth/Throat:     Mouth: Mucous membranes are moist.     Pharynx: Oropharynx is clear. No oropharyngeal exudate or posterior oropharyngeal erythema.  Eyes:     Extraocular Movements: Extraocular movements intact.     Pupils: Pupils are equal, round, and reactive to light.  Cardiovascular:     Rate and Rhythm: Normal rate and regular rhythm.     Heart sounds: Normal heart sounds. No murmur heard.    No gallop.  Pulmonary:     Effort: Pulmonary effort is normal. No respiratory distress.     Breath sounds: Normal breath sounds. No wheezing or rales.  Abdominal:     General: Bowel sounds are normal. There is no distension.     Palpations: Abdomen is soft.     Tenderness: There is no abdominal tenderness. There is no guarding.  Musculoskeletal:        General: Normal range of motion.     Cervical back: Neck supple.  Skin:    General: Skin is warm and dry.      Comments: Small ecchymosis of left forearm.  Neurological:     General: No focal deficit present.     Mental Status: She is alert and oriented to person, place, and time.  Psychiatric:        Mood and Affect: Mood normal.        Behavior: Behavior normal.     BP 108/80 (BP Location: Left Arm, Patient Position: Sitting, Cuff Size: Normal)   Pulse 63   Temp 97.8 F (36.6 C) (Oral)   Resp 18   Ht 5\' 5"  (1.651 m)   Wt 172 lb 3.2 oz (78.1 kg)   SpO2 98%   BMI 28.66 kg/m  Wt Readings from Last 3 Encounters:  02/23/23 172 lb 3.2 oz (78.1 kg)  02/20/22 165 lb (74.8 kg)  01/31/22 169 lb 6.4 oz (76.8 kg)    Diabetic Foot Exam - Simple   No data filed    Lab Results  Component Value Date   WBC 7.3 01/31/2022   HGB 14.2 01/31/2022   HCT 43.3 01/31/2022   PLT 188.0 01/31/2022   GLUCOSE 94 01/31/2022   CHOL 182 01/31/2022   TRIG 121.0 01/31/2022   HDL 60.20 01/31/2022   LDLCALC 97 01/31/2022   ALT 14 01/31/2022   AST 21 01/31/2022   NA 140 01/31/2022   K 4.1 01/31/2022   CL 103 01/31/2022   CREATININE 0.69 01/31/2022   BUN 22 01/31/2022   CO2 29 01/31/2022   TSH 2.34 11/22/2021   INR 1.11 09/01/2014   HGBA1C 5.8 11/22/2021    Lab Results  Component Value Date   TSH 2.34 11/22/2021   Lab Results  Component Value Date   WBC 7.3 01/31/2022   HGB 14.2 01/31/2022   HCT 43.3 01/31/2022   MCV 94.1 01/31/2022   PLT 188.0 01/31/2022   Lab Results  Component Value Date   NA 140 01/31/2022   K 4.1 01/31/2022  CO2 29 01/31/2022   GLUCOSE 94 01/31/2022   BUN 22 01/31/2022   CREATININE 0.69 01/31/2022   BILITOT 0.5 01/31/2022   ALKPHOS 49 01/31/2022   AST 21 01/31/2022   ALT 14 01/31/2022   PROT 6.6 01/31/2022   ALBUMIN 4.4 01/31/2022   CALCIUM 9.3 01/31/2022   ANIONGAP 13 09/01/2014   GFR 85.99 01/31/2022   Lab Results  Component Value Date   CHOL 182 01/31/2022   Lab Results  Component Value Date   HDL 60.20 01/31/2022   Lab Results  Component Value  Date   LDLCALC 97 01/31/2022   Lab Results  Component Value Date   TRIG 121.0 01/31/2022   Lab Results  Component Value Date   CHOLHDL 3 01/31/2022   Lab Results  Component Value Date   HGBA1C 5.8 11/22/2021       Assessment & Plan:   Problem List Items Addressed This Visit       Unprioritized   Preventative health care - Primary    Ghm utd Check labs  See AVS  Health Maintenance  Topic Date Due   DTaP/Tdap/Td (2 - Tdap) 01/18/2016   Zoster Vaccines- Shingrix (2 of 2) 12/09/2021   COVID-19 Vaccine (5 - 2023-24 season) 06/02/2022   MAMMOGRAM  02/09/2023   Medicare Annual Wellness (AWV)  02/21/2023   Colonoscopy  05/01/2023   INFLUENZA VACCINE  05/03/2023   Pneumonia Vaccine 53+ Years old  Completed   DEXA SCAN  Completed   Hepatitis C Screening  Completed   HPV VACCINES  Aged Out        Other Visit Diagnoses     Bee sting allergy       Relevant Medications   EPINEPHrine (EPIPEN 2-PAK) 0.3 mg/0.3 mL IJ SOAJ injection   Estrogen deficiency       Relevant Orders   DG BONE DENSITY (DXA)   Lipid panel   Comprehensive metabolic panel   CBC with Differential/Platelet   Ischemic heart disease screen       Relevant Orders   Lipid panel   Comprehensive metabolic panel   CBC with Differential/Platelet        Meds ordered this encounter  Medications   EPINEPHrine (EPIPEN 2-PAK) 0.3 mg/0.3 mL IJ SOAJ injection    Sig: Inject 0.3 mg into the muscle as needed for anaphylaxis.    Dispense:  1 each    Refill:  1    I, Donato Schultz, DO, personally preformed the services described in this documentation.  All medical record entries made by the scribe were at my direction and in my presence.  I have reviewed the chart and discharge instructions (if applicable) and agree that the record reflects my personal performance and is accurate and complete. 02/23/2023.  I,Mathew Stumpf,acting as a Neurosurgeon for [employer], DO.,have documented all relevant  documentation on the behalf of Donato Schultz, DO,as directed by  Donato Schultz, DO while in the presence of Donato Schultz, DO.   Donato Schultz, DO

## 2023-02-23 ENCOUNTER — Ambulatory Visit (INDEPENDENT_AMBULATORY_CARE_PROVIDER_SITE_OTHER): Payer: Medicare HMO | Admitting: Family Medicine

## 2023-02-23 ENCOUNTER — Encounter: Payer: Self-pay | Admitting: Family Medicine

## 2023-02-23 VITALS — BP 108/80 | HR 63 | Temp 97.8°F | Resp 18 | Ht 65.0 in | Wt 172.2 lb

## 2023-02-23 DIAGNOSIS — E2839 Other primary ovarian failure: Secondary | ICD-10-CM | POA: Diagnosis not present

## 2023-02-23 DIAGNOSIS — Z91030 Bee allergy status: Secondary | ICD-10-CM

## 2023-02-23 DIAGNOSIS — Z0000 Encounter for general adult medical examination without abnormal findings: Secondary | ICD-10-CM

## 2023-02-23 DIAGNOSIS — Z136 Encounter for screening for cardiovascular disorders: Secondary | ICD-10-CM | POA: Diagnosis not present

## 2023-02-23 MED ORDER — EPINEPHRINE 0.3 MG/0.3ML IJ SOAJ: 0.3000 mg | INTRAMUSCULAR | 1 refills | Status: AC | PRN

## 2023-02-23 NOTE — Patient Instructions (Signed)
Preventive Care 65 Years and Older, Female Preventive care refers to lifestyle choices and visits with your health care provider that can promote health and wellness. Preventive care visits are also called wellness exams. What can I expect for my preventive care visit? Counseling Your health care provider may ask you questions about your: Medical history, including: Past medical problems. Family medical history. Pregnancy and menstrual history. History of falls. Current health, including: Memory and ability to understand (cognition). Emotional well-being. Home life and relationship well-being. Sexual activity and sexual health. Lifestyle, including: Alcohol, nicotine or tobacco, and drug use. Access to firearms. Diet, exercise, and sleep habits. Work and work environment. Sunscreen use. Safety issues such as seatbelt and bike helmet use. Physical exam Your health care provider will check your: Height and weight. These may be used to calculate your BMI (body mass index). BMI is a measurement that tells if you are at a healthy weight. Waist circumference. This measures the distance around your waistline. This measurement also tells if you are at a healthy weight and may help predict your risk of certain diseases, such as type 2 diabetes and high blood pressure. Heart rate and blood pressure. Body temperature. Skin for abnormal spots. What immunizations do I need?  Vaccines are usually given at various ages, according to a schedule. Your health care provider will recommend vaccines for you based on your age, medical history, and lifestyle or other factors, such as travel or where you work. What tests do I need? Screening Your health care provider may recommend screening tests for certain conditions. This may include: Lipid and cholesterol levels. Hepatitis C test. Hepatitis B test. HIV (human immunodeficiency virus) test. STI (sexually transmitted infection) testing, if you are at  risk. Lung cancer screening. Colorectal cancer screening. Diabetes screening. This is done by checking your blood sugar (glucose) after you have not eaten for a while (fasting). Mammogram. Talk with your health care provider about how often you should have regular mammograms. BRCA-related cancer screening. This may be done if you have a family history of breast, ovarian, tubal, or peritoneal cancers. Bone density scan. This is done to screen for osteoporosis. Talk with your health care provider about your test results, treatment options, and if necessary, the need for more tests. Follow these instructions at home: Eating and drinking  Eat a diet that includes fresh fruits and vegetables, whole grains, lean protein, and low-fat dairy products. Limit your intake of foods with high amounts of sugar, saturated fats, and salt. Take vitamin and mineral supplements as recommended by your health care provider. Do not drink alcohol if your health care provider tells you not to drink. If you drink alcohol: Limit how much you have to 0-1 drink a day. Know how much alcohol is in your drink. In the [place] one drink equals one 12 oz bottle of beer (355 mL), one 5 oz glass of wine (148 mL), or one 1 oz glass of hard liquor (44 mL). Lifestyle Brush your teeth every morning and night with fluoride toothpaste. Floss one time each day. Exercise for at least 30 minutes 5 or more days each week. Do not use any products that contain nicotine or tobacco. These products include cigarettes, chewing tobacco, and vaping devices, such as e-cigarettes. If you need help quitting, ask your health care provider. Do not use drugs. If you are sexually active, practice safe sex. Use a condom or other form of protection in order to prevent STIs. Take aspirin only as told by   your health care provider. Make sure that you understand how much to take and what form to take. Work with your health care provider to find out whether it  is safe and beneficial for you to take aspirin daily. Ask your health care provider if you need to take a cholesterol-lowering medicine (statin). Find healthy ways to manage stress, such as: Meditation, yoga, or listening to music. Journaling. Talking to a trusted person. Spending time with friends and family. Minimize exposure to UV radiation to reduce your risk of skin cancer. Safety Always wear your seat belt while driving or riding in a vehicle. Do not drive: If you have been drinking alcohol. Do not ride with someone who has been drinking. When you are tired or distracted. While texting. If you have been using any mind-altering substances or drugs. Wear a helmet and other protective equipment during sports activities. If you have firearms in your house, make sure you follow all gun safety procedures. What's next? Visit your health care provider once a year for an annual wellness visit. Ask your health care provider how often you should have your eyes and teeth checked. Stay up to date on all vaccines. This information is not intended to replace advice given to you by your health care provider. Make sure you discuss any questions you have with your health care provider. Document Revised: 03/16/2021 Document Reviewed: 03/16/2021 Elsevier Patient Education  2024 [employer] Inc[employer]  

## 2023-02-23 NOTE — Assessment & Plan Note (Signed)
Ghm utd Check labs  See AVS  Health Maintenance  Topic Date Due   DTaP/Tdap/Td (2 - Tdap) 01/18/2016   Zoster Vaccines- Shingrix (2 of 2) 12/09/2021   COVID-19 Vaccine (5 - 2023-24 season) 06/02/2022   MAMMOGRAM  02/09/2023   Medicare Annual Wellness (AWV)  02/21/2023   Colonoscopy  05/01/2023   INFLUENZA VACCINE  05/03/2023   Pneumonia Vaccine 74+ Years old  Completed   DEXA SCAN  Completed   Hepatitis C Screening  Completed   HPV VACCINES  Aged Out

## 2023-03-08 ENCOUNTER — Other Ambulatory Visit: Payer: Self-pay | Admitting: Family Medicine

## 2023-03-08 ENCOUNTER — Other Ambulatory Visit (INDEPENDENT_AMBULATORY_CARE_PROVIDER_SITE_OTHER): Payer: Medicare HMO

## 2023-03-08 ENCOUNTER — Ambulatory Visit (INDEPENDENT_AMBULATORY_CARE_PROVIDER_SITE_OTHER): Payer: Medicare HMO | Admitting: *Deleted

## 2023-03-08 DIAGNOSIS — E2839 Other primary ovarian failure: Secondary | ICD-10-CM

## 2023-03-08 DIAGNOSIS — Z0000 Encounter for general adult medical examination without abnormal findings: Secondary | ICD-10-CM | POA: Diagnosis not present

## 2023-03-08 DIAGNOSIS — Z1231 Encounter for screening mammogram for malignant neoplasm of breast: Secondary | ICD-10-CM

## 2023-03-08 DIAGNOSIS — Z136 Encounter for screening for cardiovascular disorders: Secondary | ICD-10-CM

## 2023-03-08 LAB — COMPREHENSIVE METABOLIC PANEL
ALT: 16 U/L (ref 0–35)
AST: 23 U/L (ref 0–37)
Albumin: 4.4 g/dL (ref 3.5–5.2)
Alkaline Phosphatase: 44 U/L (ref 39–117)
BUN: 16 mg/dL (ref 6–23)
CO2: 28 mEq/L (ref 19–32)
Calcium: 9.1 mg/dL (ref 8.4–10.5)
Chloride: 103 mEq/L (ref 96–112)
Creatinine, Ser: 0.56 mg/dL (ref 0.40–1.20)
GFR: 89.74 mL/min (ref >60.00)
Glucose, Bld: 95 mg/dL (ref 70–99)
Potassium: 4.1 mEq/L (ref 3.5–5.1)
Sodium: 141 mEq/L (ref 135–145)
Total Bilirubin: 0.6 mg/dL (ref 0.2–1.2)
Total Protein: 6.8 g/dL (ref 6.0–8.3)

## 2023-03-08 LAB — CBC WITH DIFFERENTIAL/PLATELET
Basophils Absolute: 0 10*3/uL (ref 0.0–0.1)
Basophils Relative: 0.5 % (ref 0.0–3.0)
Eosinophils Absolute: 0.1 10*3/uL (ref 0.0–0.7)
Eosinophils Relative: 2.1 % (ref 0.0–5.0)
HCT: 44.8 % (ref 36.0–46.0)
Hemoglobin: 14.5 g/dL (ref 12.0–15.0)
Lymphocytes Relative: 27.8 % (ref 12.0–46.0)
Lymphs Abs: 1.1 10*3/uL (ref 0.7–4.0)
MCHC: 32.4 g/dL (ref 30.0–36.0)
MCV: 93 fl (ref 78.0–100.0)
Monocytes Absolute: 0.5 10*3/uL (ref 0.1–1.0)
Monocytes Relative: 12.9 % — ABNORMAL HIGH (ref 3.0–12.0)
Neutro Abs: 2.2 10*3/uL (ref 1.4–7.7)
Neutrophils Relative %: 56.7 % (ref 43.0–77.0)
Platelets: 179 10*3/uL (ref 150.0–400.0)
RBC: 4.82 Mil/uL (ref 3.87–5.11)
RDW: 13.4 % (ref 11.5–15.5)
WBC: 3.9 10*3/uL — ABNORMAL LOW (ref 4.0–10.5)

## 2023-03-08 LAB — LIPID PANEL
Cholesterol: 190 mg/dL (ref 0–200)
HDL: 60.2 mg/dL (ref >39.00)
LDL Cholesterol: 114 mg/dL — ABNORMAL HIGH (ref 0–99)
NonHDL: 129.79
Total CHOL/HDL Ratio: 3
Triglycerides: 78 mg/dL (ref 0.0–149.0)
VLDL: 15.6 mg/dL (ref 0.0–40.0)

## 2023-03-08 NOTE — Patient Instructions (Signed)
Terri Mendoza , Thank you for taking time to come for your Medicare Wellness Visit. I appreciate your ongoing commitment to your health goals. Please review the following plan we discussed and let me know if I can assist you in the future.     This is a list of the screening recommended for you and due dates:  Health Maintenance  Topic Date Due   DTaP/Tdap/Td vaccine (2 - Tdap) 01/18/2016   Zoster (Shingles) Vaccine (2 of 2) 12/09/2021   COVID-19 Vaccine (5 - 2023-24 season) 06/02/2022   Mammogram  02/09/2023   Colon Cancer Screening  05/01/2023   Flu Shot  05/03/2023   Medicare Annual Wellness Visit  03/07/2024   Pneumonia Vaccine  Completed   DEXA scan (bone density measurement)  Completed   Hepatitis C Screening  Completed   HPV Vaccine  Aged Out    Next appointment: Follow up in one year for your annual wellness visit.   Preventive Care 75 Years and Older, Female Preventive care refers to lifestyle choices and visits with your health care provider that can promote health and wellness. What does preventive care include? A yearly physical exam. This is also called an annual well check. Dental exams once or twice a year. Routine eye exams. Ask your health care provider how often you should have your eyes checked. Personal lifestyle choices, including: Daily care of your teeth and gums. Regular physical activity. Eating a healthy diet. Avoiding tobacco and drug use. Limiting alcohol use. Practicing safe sex. Taking low-dose aspirin every day. Taking vitamin and mineral supplements as recommended by your health care provider. What happens during an annual well check? The services and screenings done by your health care provider during your annual well check will depend on your age, overall health, lifestyle risk factors, and family history of disease. Counseling  Your health care provider may ask you questions about your: Alcohol use. Tobacco use. Drug use. Emotional  well-being. Home and relationship well-being. Sexual activity. Eating habits. History of falls. Memory and ability to understand (cognition). Work and work Astronomer. Reproductive health. Screening  You may have the following tests or measurements: Height, weight, and BMI. Blood pressure. Lipid and cholesterol levels. These may be checked every 5 years, or more frequently if you are over 38 years old. Skin check. Lung cancer screening. You may have this screening every year starting at age 70 if you have a 30-pack-year history of smoking and currently smoke or have quit within the past 15 years. Fecal occult blood test (FOBT) of the stool. You may have this test every year starting at age 79. Flexible sigmoidoscopy or colonoscopy. You may have a sigmoidoscopy every 5 years or a colonoscopy every 10 years starting at age 92. Hepatitis C blood test. Hepatitis B blood test. Sexually transmitted disease (STD) testing. Diabetes screening. This is done by checking your blood sugar (glucose) after you have not eaten for a while (fasting). You may have this done every 1-3 years. Bone density scan. This is done to screen for osteoporosis. You may have this done starting at age 60. Mammogram. This may be done every 1-2 years. Talk to your health care provider about how often you should have regular mammograms. Talk with your health care provider about your test results, treatment options, and if necessary, the need for more tests. Vaccines  Your health care provider may recommend certain vaccines, such as: Influenza vaccine. This is recommended every year. Tetanus, diphtheria, and acellular pertussis (Tdap, Td) vaccine.  You may need a Td booster every 10 years. Zoster vaccine. You may need this after age 60. Pneumococcal 13-valent conjugate (PCV13) vaccine. One dose is recommended after age 1. Pneumococcal polysaccharide (PPSV23) vaccine. One dose is recommended after age 16. Talk to your  health care provider about which screenings and vaccines you need and how often you need them. This information is not intended to replace advice given to you by your health care provider. Make sure you discuss any questions you have with your health care provider. Document Released: 10/15/2015 Document Revised: 06/07/2016 Document Reviewed: 07/20/2015 Elsevier Interactive Patient Education  2017 [employer].  Fall Prevention in the Home Falls can cause injuries. They can happen to people of all ages. There are many things you can do to make your home safe and to help prevent falls. What can I do on the outside of my home? Regularly fix the edges of walkways and driveways and fix any cracks. Remove anything that might make you trip as you walk through a door, such as a raised step or threshold. Trim any bushes or trees on the path to your home. Use bright outdoor lighting. Clear any walking paths of anything that might make someone trip, such as rocks or tools. Regularly check to see if handrails are loose or broken. Make sure that both sides of any steps have handrails. Any raised decks and porches should have guardrails on the edges. Have any leaves, snow, or ice cleared regularly. Use sand or salt on walking paths during winter. Clean up any spills in your garage right away. This includes oil or grease spills. What can I do in the bathroom? Use night lights. Install grab bars by the toilet and in the tub and shower. Do not use towel bars as grab bars. Use non-skid mats or decals in the tub or shower. If you need to sit down in the shower, use a plastic, non-slip stool. Keep the floor dry. Clean up any water that spills on the floor as soon as it happens. Remove soap buildup in the tub or shower regularly. Attach bath mats securely with double-sided non-slip rug tape. Do not have throw rugs and other things on the floor that can make you trip. What can I do in the bedroom? Use night  lights. Make sure that you have a light by your bed that is easy to reach. Do not use any sheets or blankets that are too big for your bed. They should not hang down onto the floor. Have a firm chair that has side arms. You can use this for support while you get dressed. Do not have throw rugs and other things on the floor that can make you trip. What can I do in the kitchen? Clean up any spills right away. Avoid walking on wet floors. Keep items that you use a lot in easy-to-reach places. If you need to reach something above you, use a strong step stool that has a grab bar. Keep electrical cords out of the way. Do not use floor polish or wax that makes floors slippery. If you must use wax, use non-skid floor wax. Do not have throw rugs and other things on the floor that can make you trip. What can I do with my stairs? Do not leave any items on the stairs. Make sure that there are handrails on both sides of the stairs and use them. Fix handrails that are broken or loose. Make sure that handrails are as long as  the stairways. Check any carpeting to make sure that it is firmly attached to the stairs. Fix any carpet that is loose or worn. Avoid having throw rugs at the top or bottom of the stairs. If you do have throw rugs, attach them to the floor with carpet tape. Make sure that you have a light switch at the top of the stairs and the bottom of the stairs. If you do not have them, ask someone to add them for you. What else can I do to help prevent falls? Wear shoes that: Do not have high heels. Have rubber bottoms. Are comfortable and fit you well. Are closed at the toe. Do not wear sandals. If you use a stepladder: Make sure that it is fully opened. Do not climb a closed stepladder. Make sure that both sides of the stepladder are locked into place. Ask someone to hold it for you, if possible. Clearly mark and make sure that you can see: Any grab bars or handrails. First and last  steps. Where the edge of each step is. Use tools that help you move around (mobility aids) if they are needed. These include: Canes. Walkers. Scooters. Crutches. Turn on the lights when you go into a dark area. Replace any light bulbs as soon as they burn out. Set up your furniture so you have a clear path. Avoid moving your furniture around. If any of your floors are uneven, fix them. If there are any pets around you, be aware of where they are. Review your medicines with your doctor. Some medicines can make you feel dizzy. This can increase your chance of falling. Ask your doctor what other things that you can do to help prevent falls. This information is not intended to replace advice given to you by your health care provider. Make sure you discuss any questions you have with your health care provider. Document Released: 07/15/2009 Document Revised: 02/24/2016 Document Reviewed: 10/23/2014 Elsevier Interactive Patient Education  2017 [employer].

## 2023-03-08 NOTE — Progress Notes (Signed)
No charge today, unable to obtain labs at previous OV.

## 2023-03-08 NOTE — Progress Notes (Signed)
Subjective:   Terri Mendoza is a 77 y.o. female who presents for Medicare Annual (Subsequent) preventive examination.  I connected with  Terri Mendoza on 03/08/23 by a audio enabled telemedicine application and verified that I am speaking with the correct person using two identifiers.  Patient Location: Home  Provider Location: Office/Clinic  I discussed the limitations of evaluation and management by telemedicine. The patient expressed understanding and agreed to proceed.   Review of Systems     Cardiac Risk Factors include: advanced age (>44men, >36 women)     Objective:    Today's Vitals   There is no height or weight on file to calculate BMI.     03/08/2023    1:47 PM 02/20/2022    2:33 PM 12/05/2021   12:52 PM 06/12/2019    2:51 PM 06/04/2018    2:25 PM 01/31/2016    3:56 PM 05/10/2015    3:14 PM  Advanced Directives  Does Patient Have a Medical Advance Directive? Yes No No Yes Yes Yes Yes  Type of Estate agent of [place];Living will   Healthcare Power of [place];Living will Living will Healthcare Power of [place];Living will Healthcare Power of [place];Living will  Does patient want to make changes to medical advance directive? No - Patient declined   No - Patient declined  No - Patient declined   Copy of Healthcare Power of Attorney in Chart? No - copy requested   No - copy requested  No - copy requested   Would patient like information on creating a medical advance directive?   No - Patient declined        Current Medications (verified) Outpatient Encounter Medications as of 03/08/2023  Medication Sig   alendronate (FOSAMAX) 70 MG tablet Take 1 tablet (70 mg total) by mouth once a week. Take with a full glass of water on an empty stomach.   Ascorbic Acid (VITAMIN C) 1000 MG tablet Take 1,000 mg by mouth daily.   diphenhydramine-acetaminophen (TYLENOL PM) 25-500 MG TABS Take 0.5 tablets by mouth at bedtime as needed.    EPINEPHrine  (EPIPEN 2-PAK) 0.3 mg/0.3 mL IJ SOAJ injection Inject 0.3 mg into the muscle as needed for anaphylaxis.   meclizine (ANTIVERT) 25 MG tablet Take 1 tablet (25 mg total) by mouth 3 (three) times daily as needed for dizziness.   Misc Natural Products (OSTEO BI-FLEX ADV TRIPLE ST) TABS Take 1 tablet by mouth daily.   No facility-administered encounter medications on file as of 03/08/2023.    Allergies (verified) Iohexol and Meloxicam   History: Past Medical History:  Diagnosis Date   Allergy    Atypical chest pain 08/31/2014   persistent CP for 7 days, negative stress echo with EF 65% on 09/01/2014   Heart murmur    Migraines    Osteopenia    Pulmonary nodule 09/02/2014   4mm LUL pulmonary nodule seen on CT   Past Surgical History:  Procedure Laterality Date   COLONOSCOPY     FINGER FRACTURE SURGERY Right 03/08/2019   MOHS SURGERY  02/14/2019   lip--- basal cell   Family History  Problem Relation Age of Onset   Cancer Mother 65       colon   Heart disease Mother 30       chf,  valve repair   Colon cancer Mother 60   Heart disease Father 39       chf   CAD Father    Factor V Leiden  deficiency Daughter    Hashimoto's thyroiditis Daughter    Hashimoto's thyroiditis Granddaughter    Heart failure Other        CHF   Coronary artery disease Other    Social History   Socioeconomic History   Marital status: Married    Spouse name: Not on file   Number of children: Not on file   Years of education: Not on file   Highest education level: Not on file  Occupational History   Not on file  Tobacco Use   Smoking status: Never   Smokeless tobacco: Never  Substance and Sexual Activity   Alcohol use: Yes    Alcohol/week: 4.0 standard drinks of alcohol    Types: 4 Glasses of wine per week   Drug use: No   Sexual activity: Yes    Partners: Male  Other Topics Concern   Not on file  Social History Narrative   Exercise every day   Social Determinants of Health   Financial  Resource Strain: Low Risk  (02/20/2022)   Overall Financial Resource Strain (CARDIA)    Difficulty of Paying Living Expenses: Not hard at all  Food Insecurity: No Food Insecurity (03/08/2023)   Hunger Vital Sign    Worried About Running Out of Food in the Last Year: Never true    Ran Out of Food in the Last Year: Never true  Transportation Needs: No Transportation Needs (03/08/2023)   PRAPARE - Administrator, Civil Service (Medical): No    Lack of Transportation (Non-Medical): No  Physical Activity: Sufficiently Active (02/20/2022)   Exercise Vital Sign    Days of Exercise per Week: 7 days    Minutes of Exercise per Session: 60 min  Stress: No Stress Concern Present (02/20/2022)   [employer] of Occupational Health - Occupational Stress Questionnaire    Feeling of Stress : Not at all  Social Connections: Moderately Integrated (02/20/2022)   Social Connection and Isolation Panel [NHANES]    Frequency of Communication with Friends and Family: More than three times a week    Frequency of Social Gatherings with Friends and Family: More than three times a week    Attends Religious Services: Never    Database administrator or Organizations: Yes    Attends Engineer, structural: 1 to 4 times per year    Marital Status: Married    Tobacco Counseling Counseling given: Not Answered   Clinical Intake:  Pre-visit preparation completed: Yes  Pain : No/denies pain     Nutritional Risks: None Diabetes: No  How often do you need to have someone help you when you read instructions, pamphlets, or other written materials from your doctor or pharmacy?: 1 - Never   Activities of Daily Living    03/08/2023    1:50 PM  In your present state of health, do you have any difficulty performing the following activities:  Hearing? 1  Comment wears hearing aids  Vision? 0  Difficulty concentrating or making decisions? 0  Walking or climbing stairs? 0  Dressing or bathing? 0   Doing errands, shopping? 0  Preparing Food and eating ? N  Using the Toilet? N  In the past six months, have you accidently leaked urine? Y  Do you have problems with loss of bowel control? N  Managing your Medications? N  Managing your Finances? N  Housekeeping or managing your Housekeeping? N    Patient Care Team: Zola Button, Grayling Congress, DO as PCP -  Laurena Slimmer, MD as Consulting Physician (Dermatology) Marlene Bast [facility])  Indicate any recent Medical Services you may have received from other than [facility] providers in the past year (date may be approximate).     Assessment:   This is a routine wellness examination for Jaylianis.  Hearing/Vision screen No results found.  Dietary issues and exercise activities discussed: Current Exercise Habits: Home exercise routine, Type of exercise: walking;Other - see comments (swim 3 days a week, pickle ball, tennis), Time (Minutes): 60, Frequency (Times/Week): 7, Weekly Exercise (Minutes/Week): 420, Intensity: Moderate, Exercise limited by: None identified   Goals Addressed   None    Depression Screen    03/08/2023    1:50 PM 02/23/2023    1:16 PM 02/20/2022    2:36 PM 01/31/2022    2:52 PM 12/23/2020    2:50 PM 06/12/2019    2:53 PM 12/27/2017    3:53 PM  PHQ 2/9 Scores  PHQ - 2 Score 0 0 0 0 0 0 0    Fall Risk    03/08/2023    1:48 PM 02/23/2023    1:15 PM 02/20/2022    2:35 PM 01/31/2022    2:52 PM 12/23/2020    2:50 PM  Fall Risk   Falls in the past year? 0 0 1 1 0  Number falls in past yr: 0 0 0 0 0  Injury with Fall? 0 0 1 1 0  Risk for fall due to : No Fall Risks  History of fall(s) History of fall(s)   Follow up Falls evaluation completed Falls evaluation completed Falls prevention discussed Falls evaluation completed Falls evaluation completed    FALL RISK PREVENTION PERTAINING TO THE HOME:  Any stairs in or around the home? No  Home free of loose throw rugs in walkways, pet beds, electrical cords, etc?  Yes  Adequate lighting in your home to reduce risk of falls? Yes   ASSISTIVE DEVICES UTILIZED TO PREVENT FALLS:  Life alert? No  Use of a cane, walker or w/c? No  Grab bars in the bathroom? Yes  Shower chair or bench in shower? Yes  Elevated toilet seat or a handicapped toilet? Yes   TIMED UP AND GO:  Was the test performed?  No, audio visit .    Cognitive Function:        03/08/2023    1:54 PM  6CIT Screen  What Year? 0 points  What month? 0 points  What time? 0 points  Count back from 20 0 points  Months in reverse 0 points  Repeat phrase 0 points  Total Score 0 points    Immunizations Immunization History  Administered Date(s) Administered   Influenza,inj,Quad PF,6+ Mos 07/02/2014   PFIZER(Purple Top)SARS-COV-2 Vaccination 10/23/2019, 11/12/2019, 07/28/2020   Pfizer Covid-19 Vaccine Bivalent Booster 51yrs & up 07/24/2021   Pneumococcal Conjugate-13 10/26/2014   Pneumococcal Polysaccharide-23 01/31/2016   Td 01/17/2006   Zoster Recombinat (Shingrix) 10/14/2021   Zoster, Live 08/07/2011    TDAP status: Due, Education has been provided regarding the importance of this vaccine. Advised may receive this vaccine at local pharmacy or Health Dept. Aware to provide a copy of the vaccination record if obtained from local pharmacy or Health Dept. Verbalized acceptance and understanding.  Flu Vaccine status: Up to date  Pneumococcal vaccine status: Up to date  Covid-19 vaccine status: Information provided on how to obtain vaccines.   Qualifies for Shingles Vaccine? Yes   Zostavax completed No   Shingrix Completed?: No.  Education has been provided regarding the importance of this vaccine. Patient has been advised to call insurance company to determine out of pocket expense if they have not yet received this vaccine. Advised may also receive vaccine at local pharmacy or Health Dept. Verbalized acceptance and understanding.  Screening Tests Health Maintenance  Topic  Date Due   DTaP/Tdap/Td (2 - Tdap) 01/18/2016   Zoster Vaccines- Shingrix (2 of 2) 12/09/2021   COVID-19 Vaccine (5 - 2023-24 season) 06/02/2022   MAMMOGRAM  02/09/2023   Medicare Annual Wellness (AWV)  02/21/2023   Colonoscopy  05/01/2023   INFLUENZA VACCINE  05/03/2023   Pneumonia Vaccine 34+ Years old  Completed   DEXA SCAN  Completed   Hepatitis C Screening  Completed   HPV VACCINES  Aged Out    Health Maintenance  Health Maintenance Due  Topic Date Due   DTaP/Tdap/Td (2 - Tdap) 01/18/2016   Zoster Vaccines- Shingrix (2 of 2) 12/09/2021   COVID-19 Vaccine (5 - 2023-24 season) 06/02/2022   MAMMOGRAM  02/09/2023   Medicare Annual Wellness (AWV)  02/21/2023   Colonoscopy  05/01/2023    Colorectal cancer screening: Type of screening: Colonoscopy. Completed 04/30/13. Repeat every 10 years  Mammogram status: Completed 02/08/22. Repeat every year  Bone Density status: Ordered 02/23/23. Pt provided with contact info and advised to call to schedule appt.  Lung Cancer Screening: (Low Dose CT Chest recommended if Age 25-80 years, 30 pack-year currently smoking OR have quit w/in 15years.) does not qualify.    Additional Screening:  Hepatitis C Screening: does qualify; Completed 03/22/16  Vision Screening: Recommended annual ophthalmology exams for early detection of glaucoma and other disorders of the eye. Is the patient up to date with their annual eye exam?  Yes  Who is the provider or what is the name of the office in which the patient attends annual eye exams? Dr. Santiago Bumpers If pt is not established with a provider, would they like to be referred to a provider to establish care? No .   Dental Screening: Recommended annual dental exams for proper oral hygiene  Community Resource Referral / Chronic Care Management: CRR required this visit?  No   CCM required this visit?  No      Plan:     I have personally reviewed and noted the following in the patient's chart:    Medical and social history Use of alcohol, tobacco or illicit drugs  Current medications and supplements including opioid prescriptions. Patient is not currently taking opioid prescriptions. Functional ability and status Nutritional status Physical activity Advanced directives List of other physicians Hospitalizations, surgeries, and ER visits in previous 12 months Vitals Screenings to include cognitive, depression, and falls Referrals and appointments  In addition, I have reviewed and discussed with patient certain preventive protocols, quality metrics, and best practice recommendations. A written personalized care plan for preventive services as well as general preventive health recommendations were provided to patient.   Due to this being a telephonic visit, the after visit summary with patients personalized plan was offered to patient via mail or my-chart. Patient would like to access on my-chart.  Donne Anon, [place]   03/08/2023   Nurse Notes: None

## 2023-03-27 ENCOUNTER — Ambulatory Visit
Admission: RE | Admit: 2023-03-27 | Discharge: 2023-03-27 | Disposition: A | Discharge status: Home / Self Care | Payer: Medicare HMO | Source: Ambulatory Visit | Attending: Family Medicine | Admitting: Family Medicine

## 2023-03-27 DIAGNOSIS — Z1231 Encounter for screening mammogram for malignant neoplasm of breast: Secondary | ICD-10-CM

## 2023-04-15 ENCOUNTER — Other Ambulatory Visit: Payer: Self-pay | Admitting: Family Medicine

## 2023-05-08 DIAGNOSIS — M25562 Pain in left knee: Secondary | ICD-10-CM | POA: Diagnosis not present

## 2023-05-08 DIAGNOSIS — M25552 Pain in left hip: Secondary | ICD-10-CM | POA: Diagnosis not present

## 2023-05-23 DIAGNOSIS — M25562 Pain in left knee: Secondary | ICD-10-CM | POA: Diagnosis not present

## 2023-06-19 DIAGNOSIS — M25562 Pain in left knee: Secondary | ICD-10-CM | POA: Diagnosis not present

## 2023-06-26 DIAGNOSIS — M25562 Pain in left knee: Secondary | ICD-10-CM | POA: Diagnosis not present

## 2023-07-11 ENCOUNTER — Other Ambulatory Visit: Payer: Self-pay | Admitting: Family Medicine

## 2023-07-11 DIAGNOSIS — S83242A Other tear of medial meniscus, current injury, left knee, initial encounter: Secondary | ICD-10-CM | POA: Diagnosis not present

## 2023-07-30 DIAGNOSIS — Y999 Unspecified external cause status: Secondary | ICD-10-CM | POA: Diagnosis not present

## 2023-07-30 DIAGNOSIS — S83242A Other tear of medial meniscus, current injury, left knee, initial encounter: Secondary | ICD-10-CM | POA: Diagnosis not present

## 2023-07-30 DIAGNOSIS — M65962 Unspecified synovitis and tenosynovitis, left lower leg: Secondary | ICD-10-CM | POA: Diagnosis not present

## 2023-07-30 DIAGNOSIS — M84362A Stress fracture, left tibia, initial encounter for fracture: Secondary | ICD-10-CM | POA: Diagnosis not present

## 2023-07-30 DIAGNOSIS — M794 Hypertrophy of (infrapatellar) fat pad: Secondary | ICD-10-CM | POA: Diagnosis not present

## 2023-07-30 DIAGNOSIS — X58XXXA Exposure to other specified factors, initial encounter: Secondary | ICD-10-CM | POA: Diagnosis not present

## 2023-08-02 HISTORY — PX: KNEE SURGERY: SHX244

## 2023-08-06 DIAGNOSIS — M25562 Pain in left knee: Secondary | ICD-10-CM | POA: Diagnosis not present

## 2023-08-08 DIAGNOSIS — M25562 Pain in left knee: Secondary | ICD-10-CM | POA: Diagnosis not present

## 2023-08-13 DIAGNOSIS — M25562 Pain in left knee: Secondary | ICD-10-CM | POA: Diagnosis not present

## 2023-08-17 DIAGNOSIS — M25562 Pain in left knee: Secondary | ICD-10-CM | POA: Diagnosis not present

## 2023-08-20 DIAGNOSIS — M25562 Pain in left knee: Secondary | ICD-10-CM | POA: Diagnosis not present

## 2023-08-22 DIAGNOSIS — M25562 Pain in left knee: Secondary | ICD-10-CM | POA: Diagnosis not present

## 2023-08-27 DIAGNOSIS — M25562 Pain in left knee: Secondary | ICD-10-CM | POA: Diagnosis not present

## 2023-09-03 DIAGNOSIS — M25562 Pain in left knee: Secondary | ICD-10-CM | POA: Diagnosis not present

## 2023-09-05 DIAGNOSIS — M25562 Pain in left knee: Secondary | ICD-10-CM | POA: Diagnosis not present

## 2023-09-12 DIAGNOSIS — M25562 Pain in left knee: Secondary | ICD-10-CM | POA: Diagnosis not present

## 2023-09-17 DIAGNOSIS — M25562 Pain in left knee: Secondary | ICD-10-CM | POA: Diagnosis not present

## 2023-10-11 DIAGNOSIS — M25562 Pain in left knee: Secondary | ICD-10-CM | POA: Diagnosis not present

## 2023-10-16 DIAGNOSIS — M25562 Pain in left knee: Secondary | ICD-10-CM | POA: Diagnosis not present

## 2023-10-19 DIAGNOSIS — M25562 Pain in left knee: Secondary | ICD-10-CM | POA: Diagnosis not present

## 2023-10-22 DIAGNOSIS — M25562 Pain in left knee: Secondary | ICD-10-CM | POA: Diagnosis not present

## 2023-10-23 DIAGNOSIS — Z4789 Encounter for other orthopedic aftercare: Secondary | ICD-10-CM | POA: Diagnosis not present

## 2023-10-23 DIAGNOSIS — M25562 Pain in left knee: Secondary | ICD-10-CM | POA: Diagnosis not present

## 2023-10-26 ENCOUNTER — Encounter: Payer: Self-pay | Admitting: Family Medicine

## 2023-10-26 ENCOUNTER — Ambulatory Visit (INDEPENDENT_AMBULATORY_CARE_PROVIDER_SITE_OTHER): Payer: Medicare HMO | Admitting: Family Medicine

## 2023-10-26 VITALS — BP 127/67 | HR 92 | Temp 97.7°F | Resp 16 | Ht 65.0 in | Wt 180.0 lb

## 2023-10-26 DIAGNOSIS — R739 Hyperglycemia, unspecified: Secondary | ICD-10-CM | POA: Diagnosis not present

## 2023-10-26 DIAGNOSIS — R635 Abnormal weight gain: Secondary | ICD-10-CM

## 2023-10-26 DIAGNOSIS — Z136 Encounter for screening for cardiovascular disorders: Secondary | ICD-10-CM

## 2023-10-26 NOTE — Progress Notes (Unsigned)
Established Patient Office Visit  Subjective   Patient ID: Terri Mendoza, female    DOB: 05/02/1946  Age: 78 y.o. MRN: [id number]  Chief Complaint  Patient presents with   [facility] management    Will like to discuss medications for weight loss   Osteoporosis    Will like to talk about her fosamax medication    HPI Discussed the use of AI scribe software for clinical note transcription with the patient, who gave verbal consent to proceed.  History of Present Illness   The patient, with a history of osteoporosis and recent left knee surgery, presents with concerns about weight gain despite regular exercise. She reports a steady weight increase over the past 35 years, which has accelerated since her knee surgery in October. The patient has been actively exercising, including water fitness classes, stationary biking, walking, and recently received clearance to resume playing pickleball. She denies consumption of sweets and alcohol.  The patient's knee surgery was due to a torn meniscus and MCL, which required arthroscopic repair. Post-surgery, she was immobilized with a straight leg brace for six weeks, during which she noticed significant weight gain. Since the brace was removed in December, she has been actively trying to lose weight through exercise but has been unsuccessful.  The patient also has concerns about her osteoporosis treatment, Fosamax, and whether she should continue it after her upcoming bone density test. She is unsure of the duration of her treatment but estimates it to be around four to five years. She also mentions a family history of Hashimoto's disease in her daughter and granddaughter, and queries about the possibility of diabetes, given a previous high sugar level that normalized after dietary changes.      Review of Systems  Constitutional:  Negative for chills, fever and malaise/fatigue.  HENT:  Negative for congestion and hearing loss.   Eyes:  Negative for  blurred vision and discharge.  Respiratory:  Negative for cough, sputum production and shortness of breath.   Cardiovascular:  Negative for chest pain, palpitations and leg swelling.  Gastrointestinal:  Negative for abdominal pain, blood in stool, constipation, diarrhea, heartburn, nausea and vomiting.  Genitourinary:  Negative for dysuria, frequency, hematuria and urgency.  Musculoskeletal:  Negative for back pain, falls and myalgias.  Skin:  Negative for rash.  Neurological:  Negative for dizziness, sensory change, loss of consciousness, weakness and headaches.  Endo/Heme/Allergies:  Negative for environmental allergies. Does not bruise/bleed easily.  Psychiatric/Behavioral:  Negative for depression and suicidal ideas. The patient is not nervous/anxious and does not have insomnia.       Objective:     BP 127/67 (BP Location: Right Arm, Patient Position: Sitting, Cuff Size: Normal)   Pulse 92   Temp 97.7 F (36.5 C) (Oral)   Resp 16   Ht 5\' 5"  (1.651 m)   Wt 180 lb (81.6 kg)   SpO2 100%   BMI 29.95 kg/m  BP Readings from Last 3 Encounters:  10/26/23 127/67  02/23/23 108/80  01/31/22 104/80   Wt Readings from Last 3 Encounters:  10/26/23 180 lb (81.6 kg)  02/23/23 172 lb 3.2 oz (78.1 kg)  02/20/22 165 lb (74.8 kg)   SpO2 Readings from Last 3 Encounters:  10/26/23 100%  02/23/23 98%  01/31/22 96%      Physical Exam Vitals and nursing note reviewed.  Constitutional:      General: She is not in acute distress.    Appearance: Normal appearance. She is well-developed.  HENT:  Head: Normocephalic and atraumatic.  Eyes:     General: No scleral icterus.       Right eye: No discharge.        Left eye: No discharge.  Cardiovascular:     Rate and Rhythm: Normal rate and regular rhythm.     Heart sounds: No murmur heard. Pulmonary:     Effort: Pulmonary effort is normal. No respiratory distress.     Breath sounds: Normal breath sounds.  Musculoskeletal:         General: Normal range of motion.     Cervical back: Normal range of motion and neck supple.     Right lower leg: No edema.     Left lower leg: No edema.  Skin:    General: Skin is warm and dry.  Neurological:     Mental Status: She is alert and oriented to person, place, and time.  Psychiatric:        Mood and Affect: Mood normal.        Behavior: Behavior normal.        Thought Content: Thought content normal.        Judgment: Judgment normal.      Results for orders placed or performed in visit on 10/26/23  CBC with Differential/Platelet  Result Value Ref Range   WBC 6.3 3.8 - 10.8 Thousand/uL   RBC 4.63 3.80 - 5.10 Million/uL   Hemoglobin 14.5 11.7 - 15.5 g/dL   HCT 78.2 95.6 - 21.3 %   MCV 93.1 80.0 - 100.0 fL   MCH 31.3 27.0 - 33.0 pg   MCHC 33.6 32.0 - 36.0 g/dL   RDW 08.6 57.8 - 46.9 %   Platelets 205 140 - 400 Thousand/uL   MPV 9.5 7.5 - 12.5 fL   Neutro Abs 4,309 1,500 - 7,800 cells/uL   Absolute Lymphocytes 1,292 850 - 3,900 cells/uL   Absolute Monocytes 580 200 - 950 cells/uL   Eosinophils Absolute 88 15 - 500 cells/uL   Basophils Absolute 32 0 - 200 cells/uL   Neutrophils Relative % 68.4 %   Total Lymphocyte 20.5 %   Monocytes Relative 9.2 %   Eosinophils Relative 1.4 %   Basophils Relative 0.5 %  Comprehensive metabolic panel  Result Value Ref Range   Glucose, Bld 166 (H) 65 - 99 mg/dL   BUN 18 7 - 25 mg/dL   Creat 6.29 (L) 5.28 - 1.00 mg/dL   BUN/Creatinine Ratio 33 (H) 6 - 22 (calc)   Sodium 139 135 - 146 mmol/L   Potassium 3.7 3.5 - 5.3 mmol/L   Chloride 103 98 - 110 mmol/L   CO2 25 20 - 32 mmol/L   Calcium 9.8 8.6 - 10.4 mg/dL   Total Protein 6.7 6.1 - 8.1 g/dL   Albumin 4.4 3.6 - 5.1 g/dL   Globulin 2.3 1.9 - 3.7 g/dL (calc)   AG Ratio 1.9 1.0 - 2.5 (calc)   Total Bilirubin 0.4 0.2 - 1.2 mg/dL   Alkaline phosphatase (APISO) 50 37 - 153 U/L   AST 27 10 - 35 U/L   ALT 19 6 - 29 U/L  Hemoglobin A1c  Result Value Ref Range   Hgb A1c MFr Bld  5.6 <5.7 % of total Hgb   Mean Plasma Glucose 114 mg/dL   eAG (mmol/L) 6.3 mmol/L  Lipid panel  Result Value Ref Range   Cholesterol 196 <200 mg/dL   HDL 58 > OR = 50 mg/dL   Triglycerides 413 <244 mg/dL  LDL Cholesterol (Calc) 114 (H) mg/dL (calc)   Total CHOL/HDL Ratio 3.4 <5.0 (calc)   Non-HDL Cholesterol (Calc) 138 (H) <130 mg/dL (calc)  TSH  Result Value Ref Range   TSH 1.46 0.40 - 4.50 mIU/L    Last CBC Lab Results  Component Value Date   WBC 6.3 10/26/2023   HGB 14.5 10/26/2023   HCT 43.1 10/26/2023   MCV 93.1 10/26/2023   MCH 31.3 10/26/2023   RDW 11.9 10/26/2023   PLT 205 10/26/2023   Last metabolic panel Lab Results  Component Value Date   GLUCOSE 166 (H) 10/26/2023   NA 139 10/26/2023   K 3.7 10/26/2023   CL 103 10/26/2023   CO2 25 10/26/2023   BUN 18 10/26/2023   CREATININE 0.54 (L) 10/26/2023   GFR 89.74 03/08/2023   CALCIUM 9.8 10/26/2023   PROT 6.7 10/26/2023   ALBUMIN 4.4 03/08/2023   BILITOT 0.4 10/26/2023   ALKPHOS 44 03/08/2023   AST 27 10/26/2023   ALT 19 10/26/2023   ANIONGAP 13 09/01/2014   Last lipids Lab Results  Component Value Date   CHOL 196 10/26/2023   HDL 58 10/26/2023   LDLCALC 114 (H) 10/26/2023   TRIG 129 10/26/2023   CHOLHDL 3.4 10/26/2023   Last hemoglobin A1c Lab Results  Component Value Date   HGBA1C 5.6 10/26/2023   Last thyroid functions Lab Results  Component Value Date   TSH 1.46 10/26/2023   T4TOTAL 7.3 11/22/2021   Last vitamin D Lab Results  Component Value Date   VD25OH 48 04/27/2011   Last vitamin B12 and Folate Lab Results  Component Value Date   VITAMINB12 546 09/20/2018      The 10-year ASCVD risk score (Arnett DK, et al., 2019) is: 15.8%    Assessment & Plan:   Problem List Items Addressed This Visit       Unprioritized   Hyperglycemia   Relevant Orders   CBC with Differential/Platelet (Completed)   Comprehensive metabolic panel (Completed)   Hemoglobin A1c (Completed)    Weight gain   Relevant Orders   TSH (Completed)   Other Visit Diagnoses       Ischemic heart disease screen    -  Primary   Relevant Orders   Lipid panel (Completed)     Assessment and Plan    Weight Gain Progressive weight gain persists despite regular exercise and a healthy diet since knee surgery in October 2024. Current activities include water fitness classes, stationary biking, and walking, with no sweets or alcohol consumption. Possible metabolic changes with age are considered, though a previous thyroid panel was normal. There is a family history of Hashimoto's thyroiditis. Last blood sugar test was normal, but high levels were noted previously. Concerns about long-term use and potential for saggy skin lead to reluctance in using medications like Ozempic. Adequate protein intake is emphasized to prevent muscle loss and saggy skin. Plan includes checking blood sugar levels, providing information on a private nutritionist specializing in weight loss, recommending the use of Weight Watchers app or My Fitness Pal for calorie tracking, and emphasizing a balanced diet with adequate protein.  Post-Surgical Knee Pain Following left knee arthroscopic surgery on August 02, 2023, for meniscus and MCL repair, exercise, including walking, is resumed and recommended for knee health. Surgery was performed by Duwayne Heck at [facility]. Continue walking as tolerated.  Osteoporosis Currently on Fosamax, with a bone density test scheduled for next month. Discussed the possibility of a Fosamax holiday if  bone density has significantly improved. Emphasized the importance of weight-bearing exercises, calcium, and vitamin D supplementation. Plan includes performing a bone density test next month, considering a Fosamax holiday if bone density has improved, continuing calcium and vitamin D supplementation, and increasing weight-bearing exercises as tolerated.  General Health Maintenance Generally healthy  with no significant comorbidities. Regular exercise and a balanced diet are maintained. Continue regular exercise and maintain a balanced diet with adequate protein, vegetables, healthy fats, and fruits.  Follow-up Follow up after bone density test results. Monitor blood sugar levels and follow up if abnormal.        No follow-ups on file.    Donato Schultz, DO

## 2023-10-27 LAB — TSH: TSH: 1.46 m[IU]/L (ref 0.40–4.50)

## 2023-10-27 LAB — COMPREHENSIVE METABOLIC PANEL
AG Ratio: 1.9 (calc) (ref 1.0–2.5)
ALT: 19 U/L (ref 6–29)
AST: 27 U/L (ref 10–35)
Albumin: 4.4 g/dL (ref 3.6–5.1)
Alkaline phosphatase (APISO): 50 U/L (ref 37–153)
BUN/Creatinine Ratio: 33 (calc) — ABNORMAL HIGH (ref 6–22)
BUN: 18 mg/dL (ref 7–25)
CO2: 25 mmol/L (ref 20–32)
Calcium: 9.8 mg/dL (ref 8.6–10.4)
Chloride: 103 mmol/L (ref 98–110)
Creat: 0.54 mg/dL — ABNORMAL LOW (ref 0.60–1.00)
Globulin: 2.3 g/dL (ref 1.9–3.7)
Glucose, Bld: 166 mg/dL — ABNORMAL HIGH (ref 65–99)
Potassium: 3.7 mmol/L (ref 3.5–5.3)
Sodium: 139 mmol/L (ref 135–146)
Total Bilirubin: 0.4 mg/dL (ref 0.2–1.2)
Total Protein: 6.7 g/dL (ref 6.1–8.1)

## 2023-10-27 LAB — LIPID PANEL
Cholesterol: 196 mg/dL (ref <200)
HDL: 58 mg/dL (ref >=50)
LDL Cholesterol (Calc): 114 mg/dL — ABNORMAL HIGH
Non-HDL Cholesterol (Calc): 138 mg/dL — ABNORMAL HIGH (ref <130)
Total CHOL/HDL Ratio: 3.4 (calc) (ref <5.0)
Triglycerides: 129 mg/dL (ref <150)

## 2023-10-27 LAB — CBC WITH DIFFERENTIAL/PLATELET
Absolute Lymphocytes: 1292 {cells}/uL (ref 850–3900)
Absolute Monocytes: 580 {cells}/uL (ref 200–950)
Basophils Absolute: 32 {cells}/uL (ref 0–200)
Basophils Relative: 0.5 %
Eosinophils Absolute: 88 {cells}/uL (ref 15–500)
Eosinophils Relative: 1.4 %
HCT: 43.1 % (ref 35.0–45.0)
Hemoglobin: 14.5 g/dL (ref 11.7–15.5)
MCH: 31.3 pg (ref 27.0–33.0)
MCHC: 33.6 g/dL (ref 32.0–36.0)
MCV: 93.1 fL (ref 80.0–100.0)
MPV: 9.5 fL (ref 7.5–12.5)
Monocytes Relative: 9.2 %
Neutro Abs: 4309 {cells}/uL (ref 1500–7800)
Neutrophils Relative %: 68.4 %
Platelets: 205 10*3/uL (ref 140–400)
RBC: 4.63 10*6/uL (ref 3.80–5.10)
RDW: 11.9 % (ref 11.0–15.0)
Total Lymphocyte: 20.5 %
WBC: 6.3 10*3/uL (ref 3.8–10.8)

## 2023-10-27 LAB — HEMOGLOBIN A1C
Hgb A1c MFr Bld: 5.6 %{Hb} (ref <5.7)
Mean Plasma Glucose: 114 mg/dL
eAG (mmol/L): 6.3 mmol/L

## 2023-10-28 ENCOUNTER — Encounter: Payer: Self-pay | Admitting: Internal Medicine

## 2023-10-31 ENCOUNTER — Encounter: Payer: Self-pay | Admitting: Family Medicine

## 2023-11-01 ENCOUNTER — Encounter: Payer: Self-pay | Admitting: Family Medicine

## 2023-11-13 ENCOUNTER — Ambulatory Visit
Admission: RE | Admit: 2023-11-13 | Discharge: 2023-11-13 | Disposition: A | Discharge status: Home / Self Care | Payer: Medicare HMO | Source: Ambulatory Visit | Attending: Family Medicine | Admitting: Family Medicine

## 2023-11-13 DIAGNOSIS — N958 Other specified menopausal and perimenopausal disorders: Secondary | ICD-10-CM | POA: Diagnosis not present

## 2023-11-13 DIAGNOSIS — E2839 Other primary ovarian failure: Secondary | ICD-10-CM

## 2023-11-13 DIAGNOSIS — M8588 Other specified disorders of bone density and structure, other site: Secondary | ICD-10-CM | POA: Diagnosis not present

## 2023-11-22 ENCOUNTER — Encounter: Payer: Self-pay | Admitting: Family Medicine

## 2023-11-22 DIAGNOSIS — Z1211 Encounter for screening for malignant neoplasm of colon: Secondary | ICD-10-CM

## 2023-11-28 ENCOUNTER — Telehealth: Payer: Self-pay

## 2023-11-28 NOTE — Telephone Encounter (Signed)
 Copied from CRM [id number]. Topic: Clinical - Lab/Test Results >> Nov 28, 2023  2:40 PM Fuller Mandril wrote: Reason for CRM: Patient called to check status of Bone Density. She also sent message. Per notes looks like entire report was not received. Can someone reach out to patient with update when available? Thank You

## 2023-11-29 DIAGNOSIS — M25562 Pain in left knee: Secondary | ICD-10-CM | POA: Diagnosis not present

## 2023-11-29 NOTE — Telephone Encounter (Signed)
Responded to patient's mychart

## 2023-12-03 DIAGNOSIS — L814 Other melanin hyperpigmentation: Secondary | ICD-10-CM | POA: Diagnosis not present

## 2023-12-03 DIAGNOSIS — Z85828 Personal history of other malignant neoplasm of skin: Secondary | ICD-10-CM | POA: Diagnosis not present

## 2023-12-03 DIAGNOSIS — L821 Other seborrheic keratosis: Secondary | ICD-10-CM | POA: Diagnosis not present

## 2023-12-03 DIAGNOSIS — L720 Epidermal cyst: Secondary | ICD-10-CM | POA: Diagnosis not present

## 2023-12-03 DIAGNOSIS — L570 Actinic keratosis: Secondary | ICD-10-CM | POA: Diagnosis not present

## 2023-12-03 DIAGNOSIS — D2272 Melanocytic nevi of left lower limb, including hip: Secondary | ICD-10-CM | POA: Diagnosis not present

## 2023-12-06 MED ORDER — ALENDRONATE SODIUM 70 MG PO TABS
70.0000 mg | ORAL_TABLET | ORAL | 1 refills | Status: AC
Start: 2023-12-06 — End: ?

## 2023-12-06 NOTE — Addendum Note (Signed)
 Addended by: Roxanne Gates on: 12/06/2023 02:30 PM   Modules accepted: Orders

## 2023-12-24 ENCOUNTER — Encounter

## 2024-01-07 ENCOUNTER — Telehealth: Payer: Self-pay | Admitting: Family Medicine

## 2024-01-07 NOTE — Telephone Encounter (Signed)
 Copied from CRM [id number]. Topic: Medicare AWV >> Jan 07, 2024  2:22 PM Payton Doughty wrote: Reason for CRM: Called LVM 01/07/2024 to schedule AWV. Please schedule Virtual or Telehealth visits ONLY.   Verlee Rossetti; Care Guide Ambulatory Clinical Support [facility] Direct Dial: [phone]

## 2024-01-10 ENCOUNTER — Telehealth: Payer: Self-pay

## 2024-01-10 NOTE — Telephone Encounter (Signed)
 Per med list pt is on Eliquis. VM left for patient to call back and verify is she is currently taking. Pt will need an OV if on anticoagulation therapy she has not been seen in the office within the last year.

## 2024-01-15 NOTE — Telephone Encounter (Signed)
 Patient returned the call and stated she is NOT on any Eliquis nor any other blood thinners since Nov.

## 2024-01-17 ENCOUNTER — Encounter: Admitting: Internal Medicine

## 2024-01-22 ENCOUNTER — Ambulatory Visit (AMBULATORY_SURGERY_CENTER)

## 2024-01-22 VITALS — Ht 65.0 in | Wt 170.0 lb

## 2024-01-22 DIAGNOSIS — Z1211 Encounter for screening for malignant neoplasm of colon: Secondary | ICD-10-CM

## 2024-01-22 MED ORDER — NA SULFATE-K SULFATE-MG SULF 17.5-3.13-1.6 GM/177ML PO SOLN: 1.0000 | Freq: Once | ORAL | 0 refills | Status: AC

## 2024-01-22 NOTE — Progress Notes (Signed)

## 2024-02-06 ENCOUNTER — Encounter: Payer: Self-pay | Admitting: Internal Medicine

## 2024-02-14 ENCOUNTER — Telehealth: Payer: Self-pay | Admitting: Internal Medicine

## 2024-02-14 DIAGNOSIS — L03221 Cellulitis of neck: Secondary | ICD-10-CM | POA: Diagnosis not present

## 2024-02-14 NOTE — Telephone Encounter (Signed)
 Returned patient call. Patient wanted to let us  know she was on antibiotic for a skin issue and taking antihistamine for allergies.  Advised it was ok to proceed with colonoscopy.

## 2024-02-14 NOTE — Telephone Encounter (Signed)
 Patients states she has had a rash on her neck that is getting worse. Has bee prescribed docyclinine.   Please advise.

## 2024-02-19 NOTE — Progress Notes (Signed)
 [facility] History and Physical   Primary Care Physician:  Crecencio Dodge, Candida Chalk, DO   Reason for Procedure:  Colon cancer screening  Plan:    Colonoscopy     HPI: Terri Mendoza is a 78 y.o. female presenting for her third screening colonoscopy.  Last exam in 2014 was normal.   Past Medical History:  Diagnosis Date   Allergy     Atypical chest pain 08/31/2014   persistent CP for 7 days, negative stress echo with EF 65% on 09/01/2014   Heart murmur    Migraines    Osteopenia    Pulmonary nodule 09/02/2014   4mm LUL pulmonary nodule seen on CT    Past Surgical History:  Procedure Laterality Date   COLONOSCOPY     FINGER FRACTURE SURGERY Right 03/08/2019   KNEE SURGERY Left 08/02/2023   [facility]--- [facility]   MOHS SURGERY  02/14/2019   lip--- basal cell    Prior to Admission medications   Medication Sig Start Date End Date Taking? Authorizing Provider  alendronate  (FOSAMAX ) 70 MG tablet Take 1 tablet (70 mg total) by mouth once a week. Take with a full glass of water on an empty stomach. 12/06/23   Lowne Chase, Yvonne R, DO  Ascorbic Acid (VITAMIN C) 1000 MG tablet Take 1,000 mg by mouth daily.    [provider]  diphenhydramine -acetaminophen  (TYLENOL  PM) 25-500 MG TABS Take 0.5 tablets by mouth at bedtime as needed.     [provider]  EPINEPHrine  (EPIPEN  2-PAK) 0.3 mg/0.3 mL IJ SOAJ injection Inject 0.3 mg into the muscle as needed for anaphylaxis. 02/23/23   Lowne Chase, Yvonne R, DO  meclizine  (ANTIVERT ) 25 MG tablet Take 1 tablet (25 mg total) by mouth 3 (three) times daily as needed for dizziness. 05/22/22   Estill Hemming, DO  Misc Natural Products (OSTEO BI-FLEX ADV TRIPLE ST) TABS Take 1 tablet by mouth daily.    [provider]  Omega-3 Fatty Acids (FISH OIL) 1000 MG CAPS Take 1 capsule by mouth daily.    [provider]    Current Outpatient Medications  Medication Sig Dispense Refill   alendronate   (FOSAMAX ) 70 MG tablet Take 1 tablet (70 mg total) by mouth once a week. Take with a full glass of water on an empty stomach. 12 tablet 1   Ascorbic Acid (VITAMIN C) 1000 MG tablet Take 1,000 mg by mouth daily.     diphenhydramine -acetaminophen  (TYLENOL  PM) 25-500 MG TABS Take 0.5 tablets by mouth at bedtime as needed.      Misc Natural Products (OSTEO BI-FLEX ADV TRIPLE ST) TABS Take 1 tablet by mouth daily.     Omega-3 Fatty Acids (FISH OIL) 1000 MG CAPS Take 1 capsule by mouth daily.     EPINEPHrine  (EPIPEN  2-PAK) 0.3 mg/0.3 mL IJ SOAJ injection Inject 0.3 mg into the muscle as needed for anaphylaxis. 1 each 1   meclizine  (ANTIVERT ) 25 MG tablet Take 1 tablet (25 mg total) by mouth 3 (three) times daily as needed for dizziness. 30 tablet 0   Current Facility-Administered Medications  Medication Dose Route Frequency Provider Last Rate Last Admin   0.9 %  sodium chloride  infusion  500 mL Intravenous Once Kenney Peacemaker, MD        Allergies as of 02/20/2024 - Review Complete 02/20/2024  Allergen Reaction Noted   Ivp dye [iodinated contrast media] Hives and Other (See Comments) 06/12/2019   Iohexol  Hives and Swelling 03/24/2013  Meloxicam Hives and Rash 06/04/2018    Family History  Problem Relation Age of Onset   Cancer Mother 18       colon   Heart disease Mother 17       chf,  valve repair   Colon cancer Mother 56   Heart disease Father 20       chf   CAD Father    Factor V Leiden deficiency Daughter    Hashimoto's thyroiditis Daughter    Hashimoto's thyroiditis Granddaughter    Heart failure Other        CHF   Coronary artery disease Other    Rectal cancer Neg Hx    Stomach cancer Neg Hx     Social History   Socioeconomic History   Marital status: Married    Spouse name: Not on file   Number of children: Not on file   Years of education: Not on file   Highest education level: Not on file  Occupational History   Not on file  Tobacco Use   Smoking status: Never    Smokeless tobacco: Never  Vaping Use   Vaping status: Never Used  Substance and Sexual Activity   Alcohol use: Yes    Alcohol/week: 4.0 standard drinks of alcohol    Types: 4 Glasses of wine per week    Comment: Occasional   Drug use: No   Sexual activity: Yes    Partners: Male  Other Topics Concern   Not on file  Social History Narrative   Exercise every day   Social Drivers of Health   Financial Resource Strain: Low Risk  (02/20/2022)   Overall Financial Resource Strain (CARDIA)    Difficulty of Paying Living Expenses: Not hard at all  Food Insecurity: No Food Insecurity (03/08/2023)   Hunger Vital Sign    Worried About Running Out of Food in the Last Year: Never true    Ran Out of Food in the Last Year: Never true  Transportation Needs: No Transportation Needs (03/08/2023)   PRAPARE - Administrator, Civil Service (Medical): No    Lack of Transportation (Non-Medical): No  Physical Activity: Sufficiently Active (02/20/2022)   Exercise Vital Sign    Days of Exercise per Week: 7 days    Minutes of Exercise per Session: 60 min  Stress: No Stress Concern Present (02/20/2022)   [employer] of Occupational Health - Occupational Stress Questionnaire    Feeling of Stress : Not at all  Social Connections: Moderately Integrated (02/20/2022)   Social Connection and Isolation Panel [NHANES]    Frequency of Communication with Friends and Family: More than three times a week    Frequency of Social Gatherings with Friends and Family: More than three times a week    Attends Religious Services: Never    Database administrator or Organizations: Yes    Attends Banker Meetings: 1 to 4 times per year    Marital Status: Married  Catering manager Violence: Not At Risk (03/08/2023)   Humiliation, Afraid, Rape, and Kick questionnaire    Fear of Current or Ex-Partner: No    Emotionally Abused: No    Physically Abused: No    Sexually Abused: No    Review of  Systems:  All other review of systems negative except as mentioned in the HPI.  Physical Exam: Vital signs BP 124/65   Pulse 62   Temp 97.8 F (36.6 C) (Temporal)   Resp 10   Ht  5\' 5"  (1.651 m)   Wt 170 lb (77.1 kg)   SpO2 99%   BMI 28.29 kg/m   General:   Alert,  Well-developed, well-nourished, pleasant and cooperative in NAD Lungs:  Clear throughout to auscultation.   Heart:  Regular rate and rhythm; no murmurs, clicks, rubs,  or gallops. Abdomen:  Soft, nontender and nondistended. Normal bowel sounds.   Neuro/Psych:  Alert and cooperative. Normal mood and affect. A and O x 3   @Hyatt Capobianco  Tammie Fall, MD, [facility] [phone] (pager) 02/20/2024 8:33 AM@

## 2024-02-20 ENCOUNTER — Ambulatory Visit (AMBULATORY_SURGERY_CENTER): Admitting: Internal Medicine

## 2024-02-20 ENCOUNTER — Encounter: Payer: Self-pay | Admitting: Internal Medicine

## 2024-02-20 ENCOUNTER — Telehealth: Payer: Self-pay | Admitting: Family Medicine

## 2024-02-20 VITALS — BP 105/49 | HR 62 | Temp 97.8°F | Resp 13 | Ht 65.0 in | Wt 170.0 lb

## 2024-02-20 DIAGNOSIS — Z1211 Encounter for screening for malignant neoplasm of colon: Secondary | ICD-10-CM

## 2024-02-20 DIAGNOSIS — K5730 Diverticulosis of large intestine without perforation or abscess without bleeding: Secondary | ICD-10-CM

## 2024-02-20 MED ORDER — SODIUM CHLORIDE 0.9 % IV SOLN
500.0000 mL | Freq: Once | INTRAVENOUS | Status: DC
Start: 2024-02-20 — End: 2024-02-20

## 2024-02-20 NOTE — Telephone Encounter (Signed)
 Copied from CRM [id number]. Topic: Medicare AWV >> Feb 20, 2024  2:53 PM Juliana Ocean wrote: Reason for CRM: LVM 02/20/2024 to schedule AWV. Please schedule Virtual or Telehealth visits ONLY  Terri Mendoza; Care Guide Ambulatory Clinical Support [place] [facility] Direct Dial: [phone]

## 2024-02-20 NOTE — Patient Instructions (Addendum)
 No polyps or cancer were seen, again.  You do have a condition called diverticulosis - common and not usually a problem. Please read the handout provided.  No more routine colonoscopy indicated.  I appreciate the opportunity to care for you. Kenney Peacemaker, MD, FACG  YOU HAD AN ENDOSCOPIC PROCEDURE TODAY AT THE [facility]:   Refer to the procedure report that was given to you for any specific questions about what was found during the examination.  If the procedure report does not answer your questions, please call your gastroenterologist to clarify.  If you requested that your care partner not be given the details of your procedure findings, then the procedure report has been included in a sealed envelope for you to review at your convenience later.  YOU SHOULD EXPECT: Some feelings of bloating in the abdomen. Passage of more gas than usual.  Walking can help get rid of the air that was put into your GI tract during the procedure and reduce the bloating. If you had a lower endoscopy (such as a colonoscopy or flexible sigmoidoscopy) you may notice spotting of blood in your stool or on the toilet paper. If you underwent a bowel prep for your procedure, you may not have a normal bowel movement for a few days.  Please Note:  You might notice some irritation and congestion in your nose or some drainage.  This is from the oxygen used during your procedure.  There is no need for concern and it should clear up in a day or so.  SYMPTOMS TO REPORT IMMEDIATELY:  Following lower endoscopy (colonoscopy or flexible sigmoidoscopy):  Excessive amounts of blood in the stool  Significant tenderness or worsening of abdominal pains  Swelling of the abdomen that is new, acute  Fever of 100F or higher  Following upper endoscopy (EGD)  Vomiting of blood or coffee ground material  New chest pain or pain under the shoulder blades  Painful or persistently difficult swallowing  New shortness of  breath  Fever of 100F or higher  Black, tarry-looking stools  For urgent or emergent issues, a gastroenterologist can be reached at any hour by calling [phone]. Do not use MyChart messaging for urgent concerns.    DIET:  We do recommend a small meal at first, but then you may proceed to your regular diet.  Drink plenty of fluids but you should avoid alcoholic beverages for 24 hours.  ACTIVITY:  You should plan to take it easy for the rest of today and you should NOT DRIVE or use heavy machinery until tomorrow (because of the sedation medicines used during the test).    FOLLOW UP: Our staff will call the number listed on your records the next business day following your procedure.  We will call around 7:15- 8:00 am to check on you and address any questions or concerns that you may have regarding the information given to you following your procedure. If we do not reach you, we will leave a message.     If any biopsies were taken you will be contacted by phone or by letter within the next 1-3 weeks.  Please call us  at [phone] if you have not heard about the biopsies in 3 weeks.    SIGNATURES/CONFIDENTIALITY: You and/or your care partner have signed paperwork which will be entered into your electronic medical record.  These signatures attest to the fact that that the information above on your After Visit Summary has been reviewed and is  understood.  Full responsibility of the confidentiality of this discharge information lies with you and/or your care-partner.

## 2024-02-20 NOTE — Progress Notes (Signed)
 Report to PACU, RN, vss, BBS= Clear.

## 2024-02-20 NOTE — Progress Notes (Signed)
 Pt's states no medical or surgical changes since previsit or office visit.

## 2024-02-20 NOTE — Op Note (Signed)
 [facility] Patient Name: Terri Mendoza Procedure Date: 02/20/2024 8:23 AM MRN: [id number] Endoscopist: Kenney Peacemaker , MD, [id number] Age: 78 Referring MD:  Date of Birth: October 10, 1945 Gender: Female Account #: [id number] Procedure:                Colonoscopy Indications:              Screening for colorectal malignant neoplasm, Last                            colonoscopy: 2014 Medicines:                Monitored Anesthesia Care Procedure:                Pre-Anesthesia Assessment:                           - Prior to the procedure, a History and Physical                            was performed, and patient medications and                            allergies were reviewed. The patient's tolerance of                            previous anesthesia was also reviewed. The risks                            and benefits of the procedure and the sedation                            options and risks were discussed with the patient.                            All questions were answered, and informed consent                            was obtained. Prior Anticoagulants: The patient has                            taken no anticoagulant or antiplatelet agents. ASA                            Grade Assessment: II - A patient with mild systemic                            disease. After reviewing the risks and benefits,                            the patient was deemed in satisfactory condition to                            undergo the procedure.  After obtaining informed consent, the colonoscope                            was passed under direct vision. Throughout the                            procedure, the patient's blood pressure, pulse, and                            oxygen saturations were monitored continuously. The                            CF [id number] #[id number] was introduced through the anus                            and advanced to the the cecum,  identified by                            appendiceal orifice and ileocecal valve. The                            colonoscopy was performed without difficulty. The                            patient tolerated the procedure well. The quality                            of the bowel preparation was excellent. The                            ileocecal valve, appendiceal orifice, and rectum                            were photographed. The bowel preparation used was                            SUPREP via split dose instruction. Scope In: 8:41:26 AM Scope Out: 8:53:35 AM Scope Withdrawal Time: 0 hours 9 minutes 11 seconds  Total Procedure Duration: 0 hours 12 minutes 9 seconds  Findings:                 The perianal and digital rectal examinations were                            normal.                           Multiple diverticula were found in the sigmoid                            colon and ascending colon.                           The exam was otherwise without abnormality on  direct and retroflexion views. Complications:            No immediate complications. Estimated Blood Loss:     Estimated blood loss: none. Impression:               - Diverticulosis in the sigmoid colon and in the                            ascending colon.                           - The examination was otherwise normal on direct                            and retroflexion views.                           - No specimens collected. Recommendation:           - Patient has a contact number available for                            emergencies. The signs and symptoms of potential                            delayed complications were discussed with the                            patient. Return to normal activities tomorrow.                            Written discharge instructions were provided to the                            patient.                           - Resume previous diet.                            - Continue present medications.                           - No repeat colonoscopy due to age and the absence                            of colonic polyps. Kenney Peacemaker, MD 02/20/2024 8:59:00 AM This report has been signed electronically.

## 2024-02-21 ENCOUNTER — Telehealth: Payer: Self-pay

## 2024-02-21 NOTE — Telephone Encounter (Signed)
  Follow up Call-     02/20/2024    7:52 AM  Call back number  Post procedure Call Back phone  # [phone]  Permission to leave phone message Yes     Patient questions:  Do you have a fever, pain , or abdominal swelling? No. Pain Score  0 *  Have you tolerated food without any problems? Yes.    Have you been able to return to your normal activities? Yes.    Do you have any questions about your discharge instructions: Diet   No. Medications  No. Follow up visit  No.  Do you have questions or concerns about your Care? No.  Actions: * If pain score is 4 or above: No action needed, pain <4.

## 2024-03-04 DIAGNOSIS — M1712 Unilateral primary osteoarthritis, left knee: Secondary | ICD-10-CM | POA: Diagnosis not present

## 2024-03-14 ENCOUNTER — Ambulatory Visit

## 2024-03-17 ENCOUNTER — Encounter: Payer: Self-pay | Admitting: Family Medicine

## 2024-03-17 ENCOUNTER — Ambulatory Visit (INDEPENDENT_AMBULATORY_CARE_PROVIDER_SITE_OTHER): Admitting: Family Medicine

## 2024-03-17 VITALS — BP 106/68 | HR 76 | Temp 97.9°F | Wt 179.4 lb

## 2024-03-17 DIAGNOSIS — Z136 Encounter for screening for cardiovascular disorders: Secondary | ICD-10-CM | POA: Diagnosis not present

## 2024-03-17 DIAGNOSIS — E2839 Other primary ovarian failure: Secondary | ICD-10-CM

## 2024-03-17 DIAGNOSIS — Z0000 Encounter for general adult medical examination without abnormal findings: Secondary | ICD-10-CM

## 2024-03-17 DIAGNOSIS — R739 Hyperglycemia, unspecified: Secondary | ICD-10-CM

## 2024-03-17 NOTE — Assessment & Plan Note (Signed)
 [facility] UTD CHECK LABS  SEE AVS  Health Maintenance  Topic Date Due   DTaP/Tdap/Td (2 - Tdap) 01/18/2016   Medicare Annual Wellness (AWV)  03/07/2024   COVID-19 Vaccine (5 - 2024-25 season) 04/02/2024 (Originally 06/03/2023)   MAMMOGRAM  03/26/2024   INFLUENZA VACCINE  05/02/2024   Pneumococcal Vaccine: 50+ Years  Completed   DEXA SCAN  Completed   Hepatitis C Screening  Completed   Zoster Vaccines- Shingrix  Completed   HPV VACCINES  Aged Out   Meningococcal B Vaccine  Aged Out   Colonoscopy  Discontinued

## 2024-03-17 NOTE — Progress Notes (Signed)
 Established Patient Office Visit  Subjective   Patient ID: Terri Mendoza, female    DOB: [birth year]  Age: 78 y.o. MRN: [id number]  Chief Complaint  Patient presents with   Annual Exam    HPI Discussed the use of AI scribe software for clinical note transcription with the patient, who gave verbal consent to proceed.  History of Present Illness Terri Mendoza is a 78 year old female who presents for a yearly physical exam.  She has ongoing knee issues following arthroscopic surgery, now experiencing bone-on-bone contact. Initially, she had a meniscus tear requiring reattachment and a torn MCL. She experiences intermittent periods of immobility followed by times of normal ambulation. She is awaiting insurance approval for gel injections to manage symptoms.  She exercises in the pool for an hour and a half, three to four days a week, but has reduced her walking due to knee pain. Previously, she walked three to four miles and played pickleball, but now can only manage two games before experiencing pain. She is planning trips to [place] and [place]  and is concerned about her ability to walk and hike due to knee issues. She is considering using a brace for additional support.  She had a recent episode of cellulitis from a bug bite on her neck, treated with antibiotics, resulting in swollen lymph nodes and redness.  She is currently taking Fosamax  for bone density management and is concerned about her recent bone density scan results. She continues to monitor her bone health.   Patient Active Problem List   Diagnosis Date Noted   Weight gain 11/16/2021   Dizzy 09/20/2018   Acute intractable headache 09/20/2018   Murmur 09/20/2018   Preventative health care 06/04/2018   Hyperglycemia 06/04/2018   Benign paroxysmal positional vertigo 06/04/2018   Lung nodule 09/17/2014   DOE (dyspnea on exertion) 08/31/2014   Osteopenia 10/25/2011   Migraine with aura 01/13/2010    Allergic rhinitis 01/13/2010   Past Medical History:  Diagnosis Date   Allergy     Atypical chest pain 08/31/2014   persistent CP for 7 days, negative stress echo with EF 65% on 09/01/2014   Heart murmur    Migraines    Osteopenia    Pulmonary nodule 09/02/2014   4mm LUL pulmonary nodule seen on CT   Past Surgical History:  Procedure Laterality Date   COLONOSCOPY     FINGER FRACTURE SURGERY Right 03/08/2019   KNEE SURGERY Left 08/02/2023   [facility]--- [facility]   MOHS SURGERY  02/14/2019   lip--- basal cell   Social History   Tobacco Use   Smoking status: Never   Smokeless tobacco: Never  Vaping Use   Vaping status: Never Used  Substance Use Topics   Alcohol use: Yes    Alcohol/week: 4.0 standard drinks of alcohol    Types: 4 Glasses of wine per week    Comment: Occasional   Drug use: No   Social History   Socioeconomic History   Marital status: Married    Spouse name: Not on file   Number of children: Not on file   Years of education: Not on file   Highest education level: Not on file  Occupational History   Not on file  Tobacco Use   Smoking status: Never   Smokeless tobacco: Never  Vaping Use   Vaping status: Never Used  Substance and Sexual Activity   Alcohol use: Yes    Alcohol/week: 4.0 standard drinks of alcohol  Types: 4 Glasses of wine per week    Comment: Occasional   Drug use: No   Sexual activity: Yes    Partners: Male  Other Topics Concern   Not on file  Social History Narrative   Exercise every day   Social Drivers of Health   Financial Resource Strain: Low Risk  (02/20/2022)   Overall Financial Resource Strain (CARDIA)    Difficulty of Paying Living Expenses: Not hard at all  Food Insecurity: No Food Insecurity (03/08/2023)   Hunger Vital Sign    Worried About Running Out of Food in the Last Year: Never true    Ran Out of Food in the Last Year: Never true  Transportation Needs: No Transportation Needs (03/08/2023)   PRAPARE -  Administrator, Civil Service (Medical): No    Lack of Transportation (Non-Medical): No  Physical Activity: Sufficiently Active (02/20/2022)   Exercise Vital Sign    Days of Exercise per Week: 7 days    Minutes of Exercise per Session: 60 min  Stress: No Stress Concern Present (02/20/2022)   [employer] of Occupational Health - Occupational Stress Questionnaire    Feeling of Stress : Not at all  Social Connections: Moderately Integrated (02/20/2022)   Social Connection and Isolation Panel    Frequency of Communication with Friends and Family: More than three times a week    Frequency of Social Gatherings with Friends and Family: More than three times a week    Attends Religious Services: Never    Database administrator or Organizations: Yes    Attends Banker Meetings: 1 to 4 times per year    Marital Status: Married  Catering manager Violence: Not At Risk (03/08/2023)   Humiliation, Afraid, Rape, and Kick questionnaire    Fear of Current or Ex-Partner: No    Emotionally Abused: No    Physically Abused: No    Sexually Abused: No   Family Status  Relation Name Status   Mother  Deceased   Father  Deceased at age 40       chf   Daughter  Alive   G Daughter  Armed forces training and education officer   Other  (Not Specified)   Other  (Not Specified)   Neg Hx  (Not Specified)  No partnership data on file   Family History  Problem Relation Age of Onset   Cancer Mother 51       colon   Heart disease Mother 78       chf,  valve repair   Colon cancer Mother 16   Heart disease Father 10       chf   CAD Father    Factor V Leiden deficiency Daughter    Hashimoto's thyroiditis Daughter    Hashimoto's thyroiditis Granddaughter    Heart failure Other        CHF   Coronary artery disease Other    Rectal cancer Neg Hx    Stomach cancer Neg Hx    Allergies  Allergen Reactions   Ivp Dye [Iodinated Contrast Media] Hives and Other (See Comments)    Throat swelling per pt   Iohexol  Hives  and Swelling   Meloxicam Hives and Rash      ROS    Objective:     BP 106/68   Pulse 76   Temp 97.9 F (36.6 C)   Wt 179 lb 6.4 oz (81.4 kg)   SpO2 96%   BMI 29.85 kg/m  BP Readings from  Last 3 Encounters:  03/17/24 106/68  02/20/24 (!) 105/49  10/26/23 127/67   Wt Readings from Last 3 Encounters:  03/17/24 179 lb 6.4 oz (81.4 kg)  02/20/24 170 lb (77.1 kg)  01/22/24 170 lb (77.1 kg)   SpO2 Readings from Last 3 Encounters:  03/17/24 96%  02/20/24 98%  10/26/23 100%      Physical Exam   No results found for any visits on 03/17/24.  Last CBC Lab Results  Component Value Date   WBC 6.3 10/26/2023   HGB 14.5 10/26/2023   HCT 43.1 10/26/2023   MCV 93.1 10/26/2023   MCH 31.3 10/26/2023   RDW 11.9 10/26/2023   PLT 205 10/26/2023   Last metabolic panel Lab Results  Component Value Date   GLUCOSE 166 (H) 10/26/2023   NA 139 10/26/2023   K 3.7 10/26/2023   CL 103 10/26/2023   CO2 25 10/26/2023   BUN 18 10/26/2023   CREATININE 0.54 (L) 10/26/2023   GFR 89.74 03/08/2023   CALCIUM 9.8 10/26/2023   PROT 6.7 10/26/2023   ALBUMIN  4.4 03/08/2023   BILITOT 0.4 10/26/2023   ALKPHOS 44 03/08/2023   AST 27 10/26/2023   ALT 19 10/26/2023   ANIONGAP 13 09/01/2014   Last lipids Lab Results  Component Value Date   CHOL 196 10/26/2023   HDL 58 10/26/2023   LDLCALC 114 (H) 10/26/2023   TRIG 129 10/26/2023   CHOLHDL 3.4 10/26/2023   Last hemoglobin A1c Lab Results  Component Value Date   HGBA1C 5.6 10/26/2023   Last thyroid  functions Lab Results  Component Value Date   TSH 1.46 10/26/2023   T4TOTAL 7.3 11/22/2021   Last vitamin D  Lab Results  Component Value Date   VD25OH 48 04/27/2011   Last vitamin B12 and Folate Lab Results  Component Value Date   VITAMINB12 546 09/20/2018      The 10-year ASCVD risk score (Arnett DK, et al., 2019) is: 11.4%    Assessment & Plan:   Problem List Items Addressed This Visit       Unprioritized    Hyperglycemia   Relevant Orders   CBC with Differential/Platelet   Comprehensive metabolic panel with GFR   Lipid panel   TSH   Preventative health care - Primary   [facility] UTD CHECK LABS  SEE AVS  Health Maintenance  Topic Date Due   DTaP/Tdap/Td (2 - Tdap) 01/18/2016   Medicare Annual Wellness (AWV)  03/07/2024   COVID-19 Vaccine (5 - 2024-25 season) 04/02/2024 (Originally 06/03/2023)   MAMMOGRAM  03/26/2024   INFLUENZA VACCINE  05/02/2024   Pneumococcal Vaccine: 50+ Years  Completed   DEXA SCAN  Completed   Hepatitis C Screening  Completed   Zoster Vaccines- Shingrix  Completed   HPV VACCINES  Aged Out   Meningococcal B Vaccine  Aged Out   Colonoscopy  Discontinued         Other Visit Diagnoses       Ischemic heart disease screen       Relevant Orders   CBC with Differential/Platelet   Comprehensive metabolic panel with GFR   Lipid panel   TSH     Estrogen deficiency       Relevant Orders   CBC with Differential/Platelet   Comprehensive metabolic panel with GFR   Lipid panel   TSH     Assessment and Plan Assessment & Plan Knee Osteoarthritis   She has progressed to osteoarthritis with bone-on-bone contact in the knee after previous  arthroscopic surgery. She experiences intermittent severe pain and difficulty walking, impacting activities like walking and pickleball. She is planning a trip to [place], which involves significant walking, and is concerned about her knee's condition during the trip. She prefers viscosupplementation before considering knee replacement surgery, understanding that knee replacement would allow immediate post-operative ambulation, unlike her previous surgery. Await insurance approval for viscosupplementation. Consider a knee brace for stability during travel. Discuss potential knee replacement surgery with an orthopedic specialist.  Osteopenia   She is on Fosamax  for osteopenia. Recent bone density results were unclear, but the condition  appears stable. She is concerned about potential side effects of alternative treatments, such as jaw pain and sores from injections. Continue Fosamax . Re-evaluate bone density results for clarity. Schedule the next bone density scan in two years.  Cellulitis   She had a recent episode of cellulitis following a bug bite on the neck, treated with antibiotics. The condition involved swollen lymph nodes and redness, but it has resolved.  General Health Maintenance   She is due for a tetanus booster, which must be obtained at a pharmacy due to Medicare coverage. She is current with mammogram, skin check, dental, and eye exams. Obtain tetanus booster at the pharmacy.    No follow-ups on file.    Marialuisa Basara R Lowne Chase, DO

## 2024-03-17 NOTE — Patient Instructions (Signed)
 Preventive Care 43 Years and Older, Female Preventive care refers to lifestyle choices and visits with your health care provider that can promote health and wellness. Preventive care visits are also called wellness exams. What can I expect for my preventive care visit? Counseling Your health care provider may ask you questions about your: Medical history, including: Past medical problems. Family medical history. Pregnancy and menstrual history. History of falls. Current health, including: Memory and ability to understand (cognition). Emotional well-being. Home life and relationship well-being. Sexual activity and sexual health. Lifestyle, including: Alcohol, nicotine or tobacco, and drug use. Access to firearms. Diet, exercise, and sleep habits. Work and work Astronomer. Sunscreen use. Safety issues such as seatbelt and bike helmet use. Physical exam Your health care provider will check your: Height and weight. These may be used to calculate your BMI (body mass index). BMI is a measurement that tells if you are at a healthy weight. Waist circumference. This measures the distance around your waistline. This measurement also tells if you are at a healthy weight and may help predict your risk of certain diseases, such as type 2 diabetes and high blood pressure. Heart rate and blood pressure. Body temperature. Skin for abnormal spots. What immunizations do I need?  Vaccines are usually given at various ages, according to a schedule. Your health care provider will recommend vaccines for you based on your age, medical history, and lifestyle or other factors, such as travel or where you work. What tests do I need? Screening Your health care provider may recommend screening tests for certain conditions. This may include: Lipid and cholesterol levels. Hepatitis C test. Hepatitis B test. HIV (human immunodeficiency virus) test. STI (sexually transmitted infection) testing, if you are at  risk. Lung cancer screening. Colorectal cancer screening. Diabetes screening. This is done by checking your blood sugar (glucose) after you have not eaten for a while (fasting). Mammogram. Talk with your health care provider about how often you should have regular mammograms. BRCA-related cancer screening. This may be done if you have a family history of breast, ovarian, tubal, or peritoneal cancers. Bone density scan. This is done to screen for osteoporosis. Talk with your health care provider about your test results, treatment options, and if necessary, the need for more tests. Follow these instructions at home: Eating and drinking  Eat a diet that includes fresh fruits and vegetables, whole grains, lean protein, and low-fat dairy products. Limit your intake of foods with high amounts of sugar, saturated fats, and salt. Take vitamin and mineral supplements as recommended by your health care provider. Do not drink alcohol if your health care provider tells you not to drink. If you drink alcohol: Limit how much you have to 0-1 drink a day. Know how much alcohol is in your drink. In the [place] one drink equals one 12 oz bottle of beer (355 mL), one 5 oz glass of wine (148 mL), or one 1 oz glass of hard liquor (44 mL). Lifestyle Brush your teeth every morning and night with fluoride toothpaste. Floss one time each day. Exercise for at least 30 minutes 5 or more days each week. Do not use any products that contain nicotine or tobacco. These products include cigarettes, chewing tobacco, and vaping devices, such as e-cigarettes. If you need help quitting, ask your health care provider. Do not use drugs. If you are sexually active, practice safe sex. Use a condom or other form of protection in order to prevent STIs. Take aspirin only as told by  your health care provider. Make sure that you understand how much to take and what form to take. Work with your health care provider to find out whether it  is safe and beneficial for you to take aspirin daily. Ask your health care provider if you need to take a cholesterol-lowering medicine (statin). Find healthy ways to manage stress, such as: Meditation, yoga, or listening to music. Journaling. Talking to a trusted person. Spending time with friends and family. Minimize exposure to UV radiation to reduce your risk of skin cancer. Safety Always wear your seat belt while driving or riding in a vehicle. Do not drive: If you have been drinking alcohol. Do not ride with someone who has been drinking. When you are tired or distracted. While texting. If you have been using any mind-altering substances or drugs. Wear a helmet and other protective equipment during sports activities. If you have firearms in your house, make sure you follow all gun safety procedures. What's next? Visit your health care provider once a year for an annual wellness visit. Ask your health care provider how often you should have your eyes and teeth checked. Stay up to date on all vaccines. This information is not intended to replace advice given to you by your health care provider. Make sure you discuss any questions you have with your health care provider. Document Revised: 03/16/2021 Document Reviewed: 03/16/2021 Elsevier Patient Education  2024 [employer].

## 2024-03-18 LAB — CBC WITH DIFFERENTIAL/PLATELET
Absolute Lymphocytes: 1370 {cells}/uL (ref 850–3900)
Absolute Monocytes: 639 {cells}/uL (ref 200–950)
Basophils Absolute: 17 {cells}/uL (ref 0–200)
Basophils Relative: 0.2 %
Eosinophils Absolute: 58 {cells}/uL (ref 15–500)
Eosinophils Relative: 0.7 %
HCT: 44.3 % (ref 35.0–45.0)
Hemoglobin: 14.4 g/dL (ref 11.7–15.5)
MCH: 30.9 pg (ref 27.0–33.0)
MCHC: 32.5 g/dL (ref 32.0–36.0)
MCV: 95.1 fL (ref 80.0–100.0)
MPV: 10 fL (ref 7.5–12.5)
Monocytes Relative: 7.7 %
Neutro Abs: 6217 {cells}/uL (ref 1500–7800)
Neutrophils Relative %: 74.9 %
Platelets: 188 10*3/uL (ref 140–400)
RBC: 4.66 10*6/uL (ref 3.80–5.10)
RDW: 12.8 % (ref 11.0–15.0)
Total Lymphocyte: 16.5 %
WBC: 8.3 10*3/uL (ref 3.8–10.8)

## 2024-03-18 LAB — COMPREHENSIVE METABOLIC PANEL WITH GFR
AG Ratio: 2.1 (calc) (ref 1.0–2.5)
ALT: 16 U/L (ref 6–29)
AST: 20 U/L (ref 10–35)
Albumin: 4.4 g/dL (ref 3.6–5.1)
Alkaline phosphatase (APISO): 49 U/L (ref 37–153)
BUN/Creatinine Ratio: 31 (calc) — ABNORMAL HIGH (ref 6–22)
BUN: 16 mg/dL (ref 7–25)
CO2: 29 mmol/L (ref 20–32)
Calcium: 9.9 mg/dL (ref 8.6–10.4)
Chloride: 103 mmol/L (ref 98–110)
Creat: 0.52 mg/dL — ABNORMAL LOW (ref 0.60–1.00)
Globulin: 2.1 g/dL (ref 1.9–3.7)
Glucose, Bld: 151 mg/dL — ABNORMAL HIGH (ref 65–99)
Potassium: 4 mmol/L (ref 3.5–5.3)
Sodium: 142 mmol/L (ref 135–146)
Total Bilirubin: 0.3 mg/dL (ref 0.2–1.2)
Total Protein: 6.5 g/dL (ref 6.1–8.1)
eGFR: 97 mL/min/{1.73_m2} (ref >=60)

## 2024-03-18 LAB — TSH: TSH: 1.96 m[IU]/L (ref 0.40–4.50)

## 2024-03-18 LAB — LIPID PANEL
Cholesterol: 191 mg/dL (ref <200)
HDL: 60 mg/dL (ref >=50)
LDL Cholesterol (Calc): 105 mg/dL — ABNORMAL HIGH
Non-HDL Cholesterol (Calc): 131 mg/dL — ABNORMAL HIGH (ref <130)
Total CHOL/HDL Ratio: 3.2 (calc) (ref <5.0)
Triglycerides: 148 mg/dL (ref <150)

## 2024-03-22 ENCOUNTER — Ambulatory Visit: Payer: Self-pay | Admitting: Family Medicine

## 2024-03-24 ENCOUNTER — Ambulatory Visit (INDEPENDENT_AMBULATORY_CARE_PROVIDER_SITE_OTHER): Admitting: *Deleted

## 2024-03-24 VITALS — Ht 65.0 in | Wt 179.0 lb

## 2024-03-24 DIAGNOSIS — Z0000 Encounter for general adult medical examination without abnormal findings: Secondary | ICD-10-CM | POA: Diagnosis not present

## 2024-03-24 NOTE — Patient Instructions (Addendum)
 Terri Mendoza , Thank you for taking time out of your busy schedule to complete your Annual Wellness Visit with me. I enjoyed our conversation and look forward to speaking with you again next year. I, as well as your care team,  appreciate your ongoing commitment to your health goals. Please review the following plan we discussed and let me know if I can assist you in the future. Your Game plan/ To Do List    Follow up Visits: Next Medicare AWV with our clinical staff:   Please call to schedule after 03/24/25.  Next Office Visit with your provider: Please call to schedule a physical with Dr Antonio Meth for 03/17/25 or after.  Clinician Recommendations:  Aim for 30 minutes of exercise or brisk walking, 6-8 glasses of water, and 5 servings of fruits and vegetables each day.   You are due for your tetanus booster and can get that at your local pharmacy.     This is a list of the screening recommended for you and due dates:  Health Maintenance  Topic Date Due   DTaP/Tdap/Td vaccine (2 - Tdap) 01/18/2016   Medicare Annual Wellness Visit  03/07/2024   COVID-19 Vaccine (5 - 2024-25 season) 04/02/2024*   Mammogram  03/26/2024   Flu Shot  05/02/2024   Pneumococcal Vaccine for age over 67  Completed   DEXA scan (bone density measurement)  Completed   Hepatitis C Screening  Completed   Zoster (Shingles) Vaccine  Completed   HPV Vaccine  Aged Out   Meningitis B Vaccine  Aged Out   Colon Cancer Screening  Discontinued  *Topic was postponed. The date shown is not the original due date.    Advanced directives (living will and or healthcare power of attorney):  (Copy Requested) Please bring a copy of your health care power of attorney and living will to the office to be added to your chart at your convenience. You can mail to [facility] [address] [place], [place] [address] or email to [email or web address] Advance Care Planning is important because it:  [x]  Makes sure  you receive the medical care that is consistent with your values, goals, and preferences  [x]  It provides guidance to your family and loved ones and reduces their decisional burden about whether or not they are making the right decisions based on your wishes.  Follow the link provided in your after visit summary or read over the paperwork we have mailed to you to help you started getting your Advance Directives in place. If you need assistance in completing these, please reach out to us  so that we can help you!  See attachments for Preventive Care.

## 2024-03-24 NOTE — Progress Notes (Signed)
 Subjective:   Terri Mendoza is a 78 y.o. who presents for a Medicare Wellness preventive visit.  As a reminder, Annual Wellness Visits don't include a physical exam, and some assessments may be limited, especially if this visit is performed virtually. We may recommend an in-person follow-up visit with your provider if needed.  Visit Complete: Virtual I connected with  [name] on 03/24/24 by a audio enabled telemedicine application and verified that I am speaking with the correct person using two identifiers.  Patient Location: Home  Provider Location: Office/Clinic  I discussed the limitations of evaluation and management by telemedicine. The patient expressed understanding and agreed to proceed.  Vital Signs: Because this visit was a virtual/telehealth visit, some criteria may be missing or patient reported. Any vitals not documented were not able to be obtained and vitals that have been documented are patient reported.  VideoDeclined- This patient declined Librarian, academic. Therefore the visit was completed with audio only.  Persons Participating in Visit: Patient.  AWV Questionnaire: No: Patient Medicare AWV questionnaire was not completed prior to this visit.  Cardiac Risk Factors include: advanced age (>48men, >68 women)     Objective:    Today's Vitals   03/24/24 1415  Weight: 179 lb (81.2 kg)  Height: 5' 5 (1.651 m)   Body mass index is 29.79 kg/m.     03/24/2024    2:35 PM 03/08/2023    1:47 PM 02/20/2022    2:33 PM 12/05/2021   12:52 PM 06/12/2019    2:51 PM 06/04/2018    2:25 PM 01/31/2016    3:56 PM  Advanced Directives  Does Patient Have a Medical Advance Directive? Yes Yes No No Yes Yes  Yes   Type of Advance Directive Living will Healthcare Power of [place];Living will   Healthcare Power of [place];Living will Living will Healthcare Power of [place];Living will   Does patient want to make changes to medical  advance directive? No - Patient declined No - Patient declined   No - Patient declined  No - Patient declined   Copy of Healthcare Power of Attorney in Chart?  No - copy requested   No - copy requested  No - copy requested   Would patient like information on creating a medical advance directive?    No - Patient declined        Data saved with a previous flowsheet row definition    Current Medications (verified) Outpatient Encounter Medications as of 03/24/2024  Medication Sig   alendronate  (FOSAMAX ) 70 MG tablet Take 1 tablet (70 mg total) by mouth once a week. Take with a full glass of water on an empty stomach.   Ascorbic Acid (VITAMIN C) 1000 MG tablet Take 1,000 mg by mouth daily.   calcium-vitamin D  (OSCAL WITH D) 500-5 MG-MCG tablet Take 1 tablet by mouth daily.   diphenhydramine -acetaminophen  (TYLENOL  PM) 25-500 MG TABS Take 0.5 tablets by mouth at bedtime as needed.    EPINEPHrine  (EPIPEN  2-PAK) 0.3 mg/0.3 mL IJ SOAJ injection Inject 0.3 mg into the muscle as needed for anaphylaxis.   meclizine  (ANTIVERT ) 25 MG tablet Take 1 tablet (25 mg total) by mouth 3 (three) times daily as needed for dizziness.   Misc Natural Products (OSTEO BI-FLEX ADV TRIPLE ST) TABS Take 1 tablet by mouth daily.   Omega-3 Fatty Acids (FISH OIL) 1000 MG CAPS Take 1 capsule by mouth daily.   No facility-administered encounter medications on file as of 03/24/2024.  Allergies (verified) Bee venom, Ivp dye [iodinated contrast media], Iohexol , and Meloxicam   History: Past Medical History:  Diagnosis Date   Allergy     Atypical chest pain 08/31/2014   persistent CP for 7 days, negative stress echo with EF 65% on 09/01/2014   Heart murmur    Migraines    Osteopenia    Pulmonary nodule 09/02/2014   4mm LUL pulmonary nodule seen on CT   Past Surgical History:  Procedure Laterality Date   COLONOSCOPY     FINGER FRACTURE SURGERY Right 03/08/2019   KNEE SURGERY Left 08/02/2023   [facility]--- [facility]    MOHS SURGERY  02/14/2019   lip--- basal cell   Family History  Problem Relation Age of Onset   Cancer Mother 25       colon   Heart disease Mother 73       chf,  valve repair   Colon cancer Mother 16   Heart disease Father 68       chf   CAD Father    Factor V Leiden deficiency Daughter    Hashimoto's thyroiditis Daughter    Hashimoto's thyroiditis Granddaughter    Heart failure Other        CHF   Coronary artery disease Other    Rectal cancer Neg Hx    Stomach cancer Neg Hx    Social History   Socioeconomic History   Marital status: Married    Spouse name: Not on file   Number of children: Not on file   Years of education: Not on file   Highest education level: Bachelor's degree (e.g., BA, AB, BS)  Occupational History   Not on file  Tobacco Use   Smoking status: Never   Smokeless tobacco: Never  Vaping Use   Vaping status: Never Used  Substance and Sexual Activity   Alcohol use: Yes    Alcohol/week: 4.0 standard drinks of alcohol    Types: 4 Glasses of wine per week    Comment: Occasional   Drug use: No   Sexual activity: Yes    Partners: Male  Other Topics Concern   Not on file  Social History Narrative   Exercise every day   Social Drivers of Health   Financial Resource Strain: Low Risk  (03/24/2024)   Overall Financial Resource Strain (CARDIA)    Difficulty of Paying Living Expenses: Not very hard  Food Insecurity: No Food Insecurity (03/24/2024)   Hunger Vital Sign    Worried About Running Out of Food in the Last Year: Never true    Ran Out of Food in the Last Year: Never true  Transportation Needs: No Transportation Needs (03/24/2024)   PRAPARE - Administrator, Civil Service (Medical): No    Lack of Transportation (Non-Medical): No  Physical Activity: Sufficiently Active (03/24/2024)   Exercise Vital Sign    Days of Exercise per Week: 7 days    Minutes of Exercise per Session: 80 min  Stress: No Stress Concern Present (03/24/2024)    [employer] of Occupational Health - Occupational Stress Questionnaire    Feeling of Stress: Not at all  Social Connections: Moderately Isolated (03/24/2024)   Social Connection and Isolation Panel    Frequency of Communication with Friends and Family: More than three times a week    Frequency of Social Gatherings with Friends and Family: Twice a week    Attends Religious Services: Never    Database administrator or Organizations: No  Attends Banker Meetings: Never    Marital Status: Married    Tobacco Counseling Counseling given: Not Answered    Clinical Intake:  Pre-visit preparation completed: Yes        BMI - recorded: 29.79 Nutritional Status: BMI 25 -29 Overweight Nutritional Risks: None Diabetes: No  Lab Results  Component Value Date   HGBA1C 5.6 10/26/2023   HGBA1C 5.8 11/22/2021   HGBA1C 5.7 06/16/2019     How often do you need to have someone help you when you read instructions, pamphlets, or other written materials from your doctor or pharmacy?: 1 - Never What is the last grade level you completed in school?: Bachelor's degree  Interpreter Needed?: No  Information entered by :: Lolita Libra, CMA   Activities of Daily Living     03/24/2024    2:31 PM  In your present state of health, do you have any difficulty performing the following activities:  Hearing? 1  Comment bilateral hearing aids  Vision? 1  Comment last exam a few months ago with Dr. Elma  Difficulty concentrating or making decisions? 0  Walking or climbing stairs? 1  Comment due to knee surgery / recovery  Dressing or bathing? 0  Doing errands, shopping? 0  Preparing Food and eating ? N  Using the Toilet? N  In the past six months, have you accidently leaked urine? Y  Comment States it has been getting better since October, doesn't need to discuss with PCP at present.  Do you have problems with loss of bowel control? N  Managing your Medications? N   Managing your Finances? N  Housekeeping or managing your Housekeeping? N    Patient Care Team: Antonio Meth, Jamee SAUNDERS, DO as PCP - General Lynnell Nottingham, MD as Consulting Physician (Dermatology) Elma Zachary RAMAN [facility])  I have updated your Care Teams any recent Medical Services you may have received from other providers in the past year.     Assessment:   This is a routine wellness examination for Camyla.  Hearing/Vision screen Hearing Screening - Comments:: Bilateral hearing aids Vision Screening - Comments:: Last eye exam  a few month ago with Dr Elma   Goals Addressed   None    Depression Screen     03/24/2024    2:28 PM 03/08/2023    1:50 PM 02/23/2023    1:16 PM 02/20/2022    2:36 PM 01/31/2022    2:52 PM 12/23/2020    2:50 PM 06/12/2019    2:53 PM  PHQ 2/9 Scores  PHQ - 2 Score 0 0 0 0 0 0 0    Fall Risk     03/24/2024    2:25 PM 03/08/2023    1:48 PM 02/23/2023    1:15 PM 02/20/2022    2:35 PM 01/31/2022    2:52 PM  Fall Risk   Falls in the past year? 0 0 0 1 1  Number falls in past yr: 0 0 0 0 0  Injury with Fall? 0 0 0 1 1  Risk for fall due to : No Fall Risks No Fall Risks  History of fall(s) History of fall(s)  Follow up Falls evaluation completed Falls evaluation completed Falls evaluation completed Falls prevention discussed  Falls evaluation completed      Data saved with a previous flowsheet row definition    MEDICARE RISK AT HOME:  Medicare Risk at Home Any stairs in or around the home?: No If so, are there any without  handrails?: No Home free of loose throw rugs in walkways, pet beds, electrical cords, etc?: No Adequate lighting in your home to reduce risk of falls?: Yes Life alert?: No Use of a cane, walker or w/c?: No Grab bars in the bathroom?: Yes Shower chair or bench in shower?: Yes Elevated toilet seat or a handicapped toilet?: Yes  TIMED UP AND GO:  Was the test performed?  No, audio  Cognitive Function: 6CIT completed         03/24/2024    2:36 PM 03/08/2023    1:54 PM  6CIT Screen  What Year? 0 points 0 points  What month? 0 points 0 points  What time? 0 points 0 points  Count back from 20 0 points 0 points  Months in reverse 0 points 0 points  Repeat phrase 0 points 0 points  Total Score 0 points 0 points    Immunizations Immunization History  Administered Date(s) Administered   Influenza,inj,Quad PF,6+ Mos 07/02/2014   PFIZER(Purple Top)SARS-COV-2 Vaccination 10/23/2019, 11/12/2019, 07/28/2020   Pfizer Covid-19 Vaccine Bivalent Booster 38yrs & up 07/24/2021   Pneumococcal Conjugate-13 10/26/2014   Pneumococcal Polysaccharide-23 01/31/2016   Td 01/17/2006   Zoster Recombinant(Shingrix) 10/14/2021, 03/01/2022   Zoster, Live 08/07/2011    Screening Tests Health Maintenance  Topic Date Due   Medicare Annual Wellness (AWV)  03/07/2024   COVID-19 Vaccine (5 - 2024-25 season) 04/02/2024 (Originally 06/03/2023)   DTaP/Tdap/Td (2 - Tdap) 03/24/2025 (Originally 01/18/2016)   MAMMOGRAM  03/26/2024   INFLUENZA VACCINE  05/02/2024   Pneumococcal Vaccine: 50+ Years  Completed   DEXA SCAN  Completed   Hepatitis C Screening  Completed   Zoster Vaccines- Shingrix  Completed   HPV VACCINES  Aged Out   Meningococcal B Vaccine  Aged Out   Colonoscopy  Discontinued    Health Maintenance  Health Maintenance Due  Topic Date Due   Medicare Annual Wellness (AWV)  03/07/2024   Health Maintenance Items Addressed: Advised to get Tetanus booster at local pharmacy and she will consider. Requested mammogram from [facility].  Additional Screening:  Vision Screening: Recommended annual ophthalmology exams for early detection of glaucoma and other disorders of the eye. Would you like a referral to an eye doctor? No    Dental Screening: Recommended annual dental exams for proper oral hygiene  Community Resource Referral / Chronic Care Management: CRR required this visit?  No   CCM required this  visit?  No   Plan:    I have personally reviewed and noted the following in the patient's chart:   Medical and social history Use of alcohol, tobacco or illicit drugs  Current medications and supplements including opioid prescriptions. Patient is not currently taking opioid prescriptions. Functional ability and status Nutritional status Physical activity Advanced directives List of other physicians Hospitalizations, surgeries, and ER visits in previous 12 months Vitals Screenings to include cognitive, depression, and falls Referrals and appointments  In addition, I have reviewed and discussed with patient certain preventive protocols, quality metrics, and best practice recommendations. A written personalized care plan for preventive services as well as general preventive health recommendations were provided to patient.   Lolita Libra, CMA   03/24/2024   After Visit Summary: (MyChart) Due to this being a telephonic visit, the after visit summary with patients personalized plan was offered to patient via MyChart   Notes: Nothing significant to report at this time.

## 2024-04-24 DIAGNOSIS — M1712 Unilateral primary osteoarthritis, left knee: Secondary | ICD-10-CM | POA: Diagnosis not present

## 2024-05-01 ENCOUNTER — Other Ambulatory Visit: Payer: Self-pay | Admitting: Family Medicine

## 2024-05-01 DIAGNOSIS — M7062 Trochanteric bursitis, left hip: Secondary | ICD-10-CM | POA: Diagnosis not present

## 2024-05-01 DIAGNOSIS — M1712 Unilateral primary osteoarthritis, left knee: Secondary | ICD-10-CM | POA: Diagnosis not present

## 2024-05-01 DIAGNOSIS — Z1231 Encounter for screening mammogram for malignant neoplasm of breast: Secondary | ICD-10-CM

## 2024-05-08 DIAGNOSIS — M1712 Unilateral primary osteoarthritis, left knee: Secondary | ICD-10-CM | POA: Diagnosis not present

## 2024-05-20 ENCOUNTER — Ambulatory Visit
Admission: RE | Admit: 2024-05-20 | Discharge: 2024-05-20 | Disposition: A | Discharge status: Home / Self Care | Source: Ambulatory Visit | Attending: Family Medicine | Admitting: Family Medicine

## 2024-05-20 DIAGNOSIS — Z1231 Encounter for screening mammogram for malignant neoplasm of breast: Secondary | ICD-10-CM

## 2024-06-10 DIAGNOSIS — M25562 Pain in left knee: Secondary | ICD-10-CM | POA: Diagnosis not present

## 2024-06-10 DIAGNOSIS — G8929 Other chronic pain: Secondary | ICD-10-CM | POA: Diagnosis not present

## 2024-06-11 ENCOUNTER — Telehealth: Payer: Self-pay | Admitting: Family Medicine

## 2024-06-11 NOTE — Telephone Encounter (Signed)
 Pt dropped of papers she needs to have filled out and requested they be faxed when they are done.

## 2024-06-11 NOTE — Telephone Encounter (Signed)
 Pt needs pre-op appt for this form to be completed.

## 2024-06-17 ENCOUNTER — Ambulatory Visit: Admitting: Family Medicine

## 2024-06-17 ENCOUNTER — Encounter: Payer: Self-pay | Admitting: Family Medicine

## 2024-06-17 VITALS — BP 108/70 | HR 79 | Temp 98.1°F | Resp 18 | Ht 65.0 in | Wt 175.4 lb

## 2024-06-17 DIAGNOSIS — Z01818 Encounter for other preprocedural examination: Secondary | ICD-10-CM | POA: Diagnosis not present

## 2024-06-17 LAB — POC URINALSYSI DIPSTICK (AUTOMATED)
Bilirubin, UA: NEGATIVE
Glucose, UA: NEGATIVE
Ketones, UA: NEGATIVE
Leukocytes, UA: NEGATIVE
Nitrite, UA: NEGATIVE
Protein, UA: NEGATIVE
Spec Grav, UA: 1.015 (ref 1.010–1.025)
Urobilinogen, UA: 0.2 U/dL
pH, UA: 5 (ref 5.0–8.0)

## 2024-06-17 NOTE — Progress Notes (Signed)
 Subjective:    Patient ID: Terri Mendoza, female    DOB: 30-Jun-1946, 78 y.o.   MRN: [id number]  Chief Complaint  Patient presents with   Pre-op Exam    HPI Patient is in today for pre op evaluation.  Discussed the use of AI scribe software for clinical note transcription with the patient, who gave verbal consent to proceed.  History of Present Illness Terri Mendoza is a 78 year old female who presents for evaluation prior to knee replacement surgery.  She is scheduled for knee replacement surgery in [place]. She previously underwent surgery on the same knee for a meniscus issue, which was deemed inappropriate for her age. Since then, her knee condition has worsened, resulting in a limp for the past year, which she believes is affecting her hip due to altered gait.  She has received gel injections in the past without relief. An EKG was completed, and her heart is reported to be okay. She has no known issues with anesthesia and has previously had general anesthesia at the surgical center.  She is considering getting a flu shot but prefers to wait until after her vacation. She inquired about the COVID shot and was informed that she can receive it at a pharmacy without a prescription due to recent changes in regulations.  No problems with anesthesia in the past.    Past Medical History:  Diagnosis Date   Allergy     Atypical chest pain 08/31/2014   persistent CP for 7 days, negative stress echo with EF 65% on 09/01/2014   Heart murmur    Migraines    Osteopenia    Pulmonary nodule 09/02/2014   4mm LUL pulmonary nodule seen on CT    Past Surgical History:  Procedure Laterality Date   COLONOSCOPY     FINGER FRACTURE SURGERY Right 03/08/2019   KNEE SURGERY Left 08/02/2023   [facility]   MOHS SURGERY  02/14/2019   lip--- basal cell    Family History  Problem Relation Age of Onset   Cancer Mother 5       colon   Heart disease Mother 23       chf,   valve repair   Colon cancer Mother 98   Heart disease Father 42       chf   CAD Father    Factor V Leiden deficiency Daughter    Hashimoto's thyroiditis Daughter    Hashimoto's thyroiditis Granddaughter    Heart failure Other        CHF   Coronary artery disease Other    Rectal cancer Neg Hx    Stomach cancer Neg Hx     Social History   Socioeconomic History   Marital status: Married    Spouse name: Not on file   Number of children: Not on file   Years of education: Not on file   Highest education level: Bachelor's degree (e.g., BA, AB, BS)  Occupational History   Not on file  Tobacco Use   Smoking status: Never   Smokeless tobacco: Never  Vaping Use   Vaping status: Never Used  Substance and Sexual Activity   Alcohol use: Yes    Alcohol/week: 4.0 standard drinks of alcohol    Types: 4 Glasses of wine per week    Comment: Occasional   Drug use: No   Sexual activity: Yes    Partners: Male  Other Topics Concern   Not on file  Social History Narrative  Exercise every day   Social Drivers of Health   Financial Resource Strain: Low Risk  (03/24/2024)   Overall Financial Resource Strain (CARDIA)    Difficulty of Paying Living Expenses: Not very hard  Food Insecurity: No Food Insecurity (03/24/2024)   Hunger Vital Sign    Worried About Running Out of Food in the Last Year: Never true    Ran Out of Food in the Last Year: Never true  Transportation Needs: No Transportation Needs (03/24/2024)   PRAPARE - Administrator, Civil Service (Medical): No    Lack of Transportation (Non-Medical): No  Physical Activity: Sufficiently Active (03/24/2024)   Exercise Vital Sign    Days of Exercise per Week: 7 days    Minutes of Exercise per Session: 80 min  Stress: No Stress Concern Present (03/24/2024)   [employer] of Occupational Health - Occupational Stress Questionnaire    Feeling of Stress: Not at all  Social Connections: Moderately Isolated (03/24/2024)    Social Connection and Isolation Panel    Frequency of Communication with Friends and Family: More than three times a week    Frequency of Social Gatherings with Friends and Family: Twice a week    Attends Religious Services: Never    Database administrator or Organizations: No    Attends Banker Meetings: Never    Marital Status: Married  Catering manager Violence: Not At Risk (03/24/2024)   Humiliation, Afraid, Rape, and Kick questionnaire    Fear of Current or Ex-Partner: No    Emotionally Abused: No    Physically Abused: No    Sexually Abused: No    Outpatient Medications Prior to Visit  Medication Sig Dispense Refill   alendronate  (FOSAMAX ) 70 MG tablet Take 1 tablet (70 mg total) by mouth once a week. Take with a full glass of water on an empty stomach. 12 tablet 1   Ascorbic Acid (VITAMIN C) 1000 MG tablet Take 1,000 mg by mouth daily.     calcium-vitamin D  (OSCAL WITH D) 500-5 MG-MCG tablet Take 1 tablet by mouth daily.     diphenhydramine -acetaminophen  (TYLENOL  PM) 25-500 MG TABS Take 0.5 tablets by mouth at bedtime as needed.      EPINEPHrine  (EPIPEN  2-PAK) 0.3 mg/0.3 mL IJ SOAJ injection Inject 0.3 mg into the muscle as needed for anaphylaxis. 1 each 1   meclizine  (ANTIVERT ) 25 MG tablet Take 1 tablet (25 mg total) by mouth 3 (three) times daily as needed for dizziness. 30 tablet 0   Misc Natural Products (OSTEO BI-FLEX ADV TRIPLE ST) TABS Take 1 tablet by mouth daily.     Omega-3 Fatty Acids (FISH OIL) 1000 MG CAPS Take 1 capsule by mouth daily.     No facility-administered medications prior to visit.    Allergies  Allergen Reactions   Bee Venom Swelling    Throat swelling   Ivp Dye [Iodinated Contrast Media] Hives and Other (See Comments)    Throat swelling per pt   Iohexol  Hives and Swelling   Meloxicam Hives and Rash    Review of Systems  Constitutional:  Negative for fever and malaise/fatigue.  HENT:  Negative for congestion.   Eyes:  Negative for  blurred vision.  Respiratory:  Negative for cough and shortness of breath.   Cardiovascular:  Negative for chest pain, palpitations and leg swelling.  Gastrointestinal:  Negative for abdominal pain, blood in stool, nausea and vomiting.  Genitourinary:  Negative for dysuria and frequency.  Musculoskeletal:  Negative  for back pain and falls.  Skin:  Negative for rash.  Neurological:  Negative for dizziness, loss of consciousness and headaches.  Endo/Heme/Allergies:  Negative for environmental allergies.  Psychiatric/Behavioral:  Negative for depression. The patient is not nervous/anxious.        Objective:    Physical Exam Vitals and nursing note reviewed.  Constitutional:      General: She is not in acute distress.    Appearance: Normal appearance. She is well-developed.  HENT:     Head: Normocephalic and atraumatic.     Right Ear: Tympanic membrane, ear canal and external ear normal. There is no impacted cerumen.     Left Ear: Tympanic membrane, ear canal and external ear normal. There is no impacted cerumen.     Nose: Nose normal.     Mouth/Throat:     Mouth: Mucous membranes are moist.     Pharynx: Oropharynx is clear. No oropharyngeal exudate or posterior oropharyngeal erythema.  Eyes:     General: No scleral icterus.       Right eye: No discharge.        Left eye: No discharge.     Conjunctiva/sclera: Conjunctivae normal.     Pupils: Pupils are equal, round, and reactive to light.  Neck:     Thyroid : No thyromegaly or thyroid  tenderness.     Vascular: No JVD.  Cardiovascular:     Rate and Rhythm: Normal rate and regular rhythm.     Heart sounds: Normal heart sounds. No murmur heard. Pulmonary:     Effort: Pulmonary effort is normal. No respiratory distress.     Breath sounds: Normal breath sounds.  Abdominal:     General: Bowel sounds are normal. There is no distension.     Palpations: Abdomen is soft. There is no mass.     Tenderness: There is no abdominal  tenderness. There is no guarding or rebound.  Musculoskeletal:        General: Normal range of motion.     Cervical back: Normal range of motion and neck supple.     Right lower leg: No edema.     Left lower leg: No edema.  Lymphadenopathy:     Cervical: No cervical adenopathy.  Skin:    General: Skin is warm and dry.     Findings: No erythema or rash.  Neurological:     Mental Status: She is alert and oriented to person, place, and time.     Cranial Nerves: No cranial nerve deficit.     Deep Tendon Reflexes: Reflexes are normal and symmetric.  Psychiatric:        Mood and Affect: Mood normal.        Behavior: Behavior normal.        Thought Content: Thought content normal.        Judgment: Judgment normal.     BP 108/70 (BP Location: Left Arm, Patient Position: Sitting, Cuff Size: Large)   Pulse 79   Temp 98.1 F (36.7 C) (Oral)   Resp 18   Ht 5' 5 (1.651 m)   Wt 175 lb 6.4 oz (79.6 kg)   SpO2 95%   BMI 29.19 kg/m  Wt Readings from Last 3 Encounters:  06/17/24 175 lb 6.4 oz (79.6 kg)  03/24/24 179 lb (81.2 kg)  03/17/24 179 lb 6.4 oz (81.4 kg)    Diabetic Foot Exam - Simple   No data filed    Lab Results  Component Value Date   WBC 8.7  06/17/2024   HGB 15.7 (H) 06/17/2024   HCT 46.9 (H) 06/17/2024   PLT 165.0 06/17/2024   GLUCOSE 120 (H) 06/17/2024   CHOL 191 03/17/2024   TRIG 148 03/17/2024   HDL 60 03/17/2024   LDLCALC 105 (H) 03/17/2024   ALT 16 06/17/2024   AST 23 06/17/2024   NA 140 06/17/2024   K 4.0 06/17/2024   CL 103 06/17/2024   CREATININE 0.62 06/17/2024   BUN 25 (H) 06/17/2024   CO2 27 06/17/2024   TSH 1.96 03/17/2024   INR 1.2 (H) 06/17/2024   HGBA1C 5.6 10/26/2023    Lab Results  Component Value Date   TSH 1.96 03/17/2024   Lab Results  Component Value Date   WBC 8.7 06/17/2024   HGB 15.7 (H) 06/17/2024   HCT 46.9 (H) 06/17/2024   MCV 92.6 06/17/2024   PLT 165.0 06/17/2024   Lab Results  Component Value Date   NA 140  06/17/2024   K 4.0 06/17/2024   CO2 27 06/17/2024   GLUCOSE 120 (H) 06/17/2024   BUN 25 (H) 06/17/2024   CREATININE 0.62 06/17/2024   BILITOT 0.4 06/17/2024   ALKPHOS 46 06/17/2024   AST 23 06/17/2024   ALT 16 06/17/2024   PROT 6.9 06/17/2024   ALBUMIN  4.6 06/17/2024   CALCIUM 10.1 06/17/2024   ANIONGAP 13 09/01/2014   EGFR 97 03/17/2024   GFR 86.78 06/17/2024   Lab Results  Component Value Date   CHOL 191 03/17/2024   Lab Results  Component Value Date   HDL 60 03/17/2024   Lab Results  Component Value Date   LDLCALC 105 (H) 03/17/2024   Lab Results  Component Value Date   TRIG 148 03/17/2024   Lab Results  Component Value Date   CHOLHDL 3.2 03/17/2024   Lab Results  Component Value Date   HGBA1C 5.6 10/26/2023       Assessment & Plan:  Pre-op exam -     EKG 12-Lead -     CBC with Differential/Platelet -     Comprehensive metabolic panel with GFR -     POCT Urinalysis Dipstick (Automated) -     Protime-INR -     APTT  Assessment and Plan Assessment & Plan Osteoarthritis of knee with associated gait abnormality   Chronic osteoarthritis of the knee has worsened over the past year. Previous meniscus surgery was not suitable for her age. Symptoms include limping and potential hip pain from altered gait. Gel injections were ineffective. She is scheduled for knee replacement surgery, pending pre-operative clearance. She prefers general anesthesia over spinal anesthesia with sedation and needs to discuss this with the surgical team. Order CBC, CMP, PT, and INR for pre-operative evaluation. Fax necessary documents to the surgical center to expedite scheduling.  General Health Maintenance   Flu and COVID vaccinations were discussed. She is eligible for the COVID vaccine without a prescription due to recent regulation changes. Administer the flu shot at a later date, as she is going on vacation and prefers not to receive it today. Advise obtaining the COVID vaccine at  the pharmacy without a prescription.    Sajan Cheatwood R Lowne Chase, DO

## 2024-06-18 LAB — COMPREHENSIVE METABOLIC PANEL WITH GFR
ALT: 16 U/L (ref 0–35)
AST: 23 U/L (ref 0–37)
Albumin: 4.6 g/dL (ref 3.5–5.2)
Alkaline Phosphatase: 46 U/L (ref 39–117)
BUN: 25 mg/dL — ABNORMAL HIGH (ref 6–23)
CO2: 27 meq/L (ref 19–32)
Calcium: 10.1 mg/dL (ref 8.4–10.5)
Chloride: 103 meq/L (ref 96–112)
Creatinine, Ser: 0.62 mg/dL (ref 0.40–1.20)
GFR: 86.78 mL/min (ref >60.00)
Glucose, Bld: 120 mg/dL — ABNORMAL HIGH (ref 70–99)
Potassium: 4 meq/L (ref 3.5–5.1)
Sodium: 140 meq/L (ref 135–145)
Total Bilirubin: 0.4 mg/dL (ref 0.2–1.2)
Total Protein: 6.9 g/dL (ref 6.0–8.3)

## 2024-06-18 LAB — CBC WITH DIFFERENTIAL/PLATELET
Basophils Absolute: 0.1 K/uL (ref 0.0–0.1)
Basophils Relative: 0.7 % (ref 0.0–3.0)
Eosinophils Absolute: 0 K/uL (ref 0.0–0.7)
Eosinophils Relative: 0.4 % (ref 0.0–5.0)
HCT: 46.9 % — ABNORMAL HIGH (ref 36.0–46.0)
Hemoglobin: 15.7 g/dL — ABNORMAL HIGH (ref 12.0–15.0)
Lymphocytes Relative: 13.9 % (ref 12.0–46.0)
Lymphs Abs: 1.2 K/uL (ref 0.7–4.0)
MCHC: 33.4 g/dL (ref 30.0–36.0)
MCV: 92.6 fl (ref 78.0–100.0)
Monocytes Absolute: 0.6 K/uL (ref 0.1–1.0)
Monocytes Relative: 7.1 % (ref 3.0–12.0)
Neutro Abs: 6.8 K/uL (ref 1.4–7.7)
Neutrophils Relative %: 77.9 % — ABNORMAL HIGH (ref 43.0–77.0)
Platelets: 165 K/uL (ref 150.0–400.0)
RBC: 5.06 Mil/uL (ref 3.87–5.11)
RDW: 14.4 % (ref 11.5–15.5)
WBC: 8.7 K/uL (ref 4.0–10.5)

## 2024-06-18 NOTE — Telephone Encounter (Signed)
 Pre-op form, OV note,labs and EKG faxed

## 2024-06-19 LAB — PROTIME-INR
INR: 1.2 ratio — ABNORMAL HIGH (ref 0.8–1.0)
Prothrombin Time: 12.6 s (ref 9.6–13.1)

## 2024-06-19 LAB — APTT: aPTT: 27.8 s (ref 25.4–36.8)

## 2024-06-20 ENCOUNTER — Ambulatory Visit: Payer: Self-pay | Admitting: Family Medicine

## 2024-06-25 ENCOUNTER — Telehealth: Payer: Self-pay

## 2024-06-25 ENCOUNTER — Encounter: Payer: Self-pay | Admitting: Family Medicine

## 2024-06-25 NOTE — Telephone Encounter (Signed)
 Unsure where form is, will leave for [facility]

## 2024-06-25 NOTE — Telephone Encounter (Signed)
 Copied from CRM #[id number]. Topic: General - Other >> Jun 25, 2024  8:20 AM Antwanette L wrote: Reason for CRM: Patient called to report that Dr. Marcey Raman (Orthopedic, [facility]) has not yet received the surgical clearance form from Dr. Antonio Meth. Please ensure that Dr. Antonio Meth faxes the form as soon as possible. The fax number is provided on the form. >> Jun 25, 2024  8:27 AM Antwanette L wrote: Please send the fax before 10/1 because Dr. Marcey Raman office is going to change their fax number. Patient is requesting a callback at [phone]

## 2024-06-27 NOTE — Telephone Encounter (Signed)
 Form faxed again.

## 2024-07-07 ENCOUNTER — Telehealth: Payer: Self-pay | Admitting: Family Medicine

## 2024-07-07 NOTE — Telephone Encounter (Signed)
 Copied from CRM [id number]. Topic: Medical Record Request - Provider/Facility Request >> Jul 07, 2024  8:44 AM Ahlexyia S wrote: Reason for CRM: Channing from [facility] offices called in needing to receive any office visits, lab/blood work information and any other info pertaining to pt upcoming total knee surgery. Channing is needing to have that sent to her fax number (listed below).  Channing Fax # [phone]

## 2024-07-08 ENCOUNTER — Other Ambulatory Visit: Payer: Self-pay | Admitting: Family Medicine

## 2024-07-08 ENCOUNTER — Encounter: Payer: Self-pay | Admitting: Family Medicine

## 2024-07-08 DIAGNOSIS — Z832 Family history of diseases of the blood and blood-forming organs and certain disorders involving the immune mechanism: Secondary | ICD-10-CM

## 2024-07-08 NOTE — Telephone Encounter (Signed)
 Faxed

## 2024-07-10 ENCOUNTER — Other Ambulatory Visit (INDEPENDENT_AMBULATORY_CARE_PROVIDER_SITE_OTHER)

## 2024-07-10 DIAGNOSIS — Z832 Family history of diseases of the blood and blood-forming organs and certain disorders involving the immune mechanism: Secondary | ICD-10-CM

## 2024-07-14 LAB — FACTOR 5 LEIDEN: Result: POSITIVE — AB

## 2024-07-19 ENCOUNTER — Ambulatory Visit: Payer: Self-pay | Admitting: Family Medicine

## 2024-07-19 DIAGNOSIS — D6851 Activated protein C resistance: Secondary | ICD-10-CM

## 2024-08-11 ENCOUNTER — Inpatient Hospital Stay: Admitting: Hematology & Oncology

## 2024-08-11 ENCOUNTER — Inpatient Hospital Stay

## 2024-08-18 ENCOUNTER — Encounter: Payer: Self-pay | Admitting: Hematology & Oncology

## 2024-08-18 ENCOUNTER — Inpatient Hospital Stay (HOSPITAL_BASED_OUTPATIENT_CLINIC_OR_DEPARTMENT_OTHER): Admitting: Hematology & Oncology

## 2024-08-18 ENCOUNTER — Inpatient Hospital Stay: Attending: Hematology & Oncology

## 2024-08-18 VITALS — BP 117/61 | HR 72 | Temp 98.2°F | Resp 20 | Ht 65.0 in | Wt 180.0 lb

## 2024-08-18 DIAGNOSIS — Z800 Family history of malignant neoplasm of digestive organs: Secondary | ICD-10-CM

## 2024-08-18 DIAGNOSIS — D6851 Activated protein C resistance: Secondary | ICD-10-CM | POA: Diagnosis not present

## 2024-08-18 MED ORDER — RIVAROXABAN 2.5 MG PO TABS: 5.0000 mg | ORAL_TABLET | Freq: Two times a day (BID) | ORAL | 0 refills | Status: AC

## 2024-08-18 NOTE — Progress Notes (Signed)
 Referral MD  Reason for Referral: Heterozygous factor V Leiden mutation  Chief Complaint  Patient presents with   New Patient (Initial Visit)    I have Factor 5  : I am going to have left knee surgery next Monday.  HPI: Terri Mendoza is a very nice 78 year old white woman.  She is originally from [place] .  We had a wonderful time talking about [place] .  She and her husband have been down in [place]  for many years.  She has factor V Leiden mutation.  Her daughter had blood clots during her pregnancy.  There is several family members who have had thromboembolic disease.  She was checked for factor V Leiden about a month or so ago.  She is heterozygous for the mutation.  The reason that she came to our office is that she is going to have knee replacement next Monday.  She apparently had orthoscopic knee surgery a year ago.  She had no problems with this.  She has never had a blood clot.  She never had any miscarriages.  She did have COVID when it first started but was not hospitalized..  She does not smoke.  She has rare alcohol use.  She has had I think C-sections.  She has had no fever.  She has had no change in bowel or bladder habits.  She is up-to-date with her mammogram and her colonoscopy.  She has had no bleeding.  Again she came to us  to make sure that she is adequately protected so she does not get thromboembolism after surgery.  Currently, I would have to say that her performance status is probably ECOG 1.    Past Medical History:  Diagnosis Date   Allergy     Atypical chest pain 08/31/2014   persistent CP for 7 days, negative stress echo with EF 65% on 09/01/2014   Heart murmur    Migraines    Osteopenia    Pulmonary nodule 09/02/2014   4mm LUL pulmonary nodule seen on CT  :   Past Surgical History:  Procedure Laterality Date   COLONOSCOPY     FINGER FRACTURE SURGERY Right 03/08/2019   KNEE SURGERY Left 08/02/2023   [facility]   MOHS SURGERY  02/14/2019   lip--- basal cell  :   Current Outpatient Medications:    alendronate  (FOSAMAX ) 70 MG tablet, Take 1 tablet (70 mg total) by mouth once a week. Take with a full glass of water on an empty stomach., Disp: 12 tablet, Rfl: 1   Ascorbic Acid (VITAMIN C) 1000 MG tablet, Take 1,000 mg by mouth daily., Disp: , Rfl:    calcium-vitamin D  (OSCAL WITH D) 500-5 MG-MCG tablet, Take 1 tablet by mouth daily., Disp: , Rfl:    diphenhydramine -acetaminophen  (TYLENOL  PM) 25-500 MG TABS, Take 0.5 tablets by mouth at bedtime as needed. , Disp: , Rfl:    EPINEPHrine  (EPIPEN  2-PAK) 0.3 mg/0.3 mL IJ SOAJ injection, Inject 0.3 mg into the muscle as needed for anaphylaxis., Disp: 1 each, Rfl: 1   meclizine  (ANTIVERT ) 25 MG tablet, Take 1 tablet (25 mg total) by mouth 3 (three) times daily as needed for dizziness., Disp: 30 tablet, Rfl: 0   Misc Natural Products (OSTEO BI-FLEX ADV TRIPLE ST) TABS, Take 1 tablet by mouth daily., Disp: , Rfl:    Omega-3 Fatty Acids (FISH OIL) 1000 MG CAPS, Take 1 capsule by mouth daily., Disp: , Rfl: :  :   Allergies  Allergen Reactions   Bee Venom  Swelling    Throat swelling   Ivp Dye [Iodinated Contrast Media] Hives and Other (See Comments)    Throat swelling per pt   Iohexol  Hives and Swelling   Meloxicam Hives and Rash  :   Family History  Problem Relation Age of Onset   Cancer Mother 37       colon   Heart disease Mother 74       chf,  valve repair   Colon cancer Mother 34   Heart disease Father 71       chf   CAD Father    Factor V Leiden deficiency Daughter    Hashimoto's thyroiditis Daughter    Hashimoto's thyroiditis Granddaughter    Heart failure Other        CHF   Coronary artery disease Other    Rectal cancer Neg Hx    Stomach cancer Neg Hx   :   Social History   Socioeconomic History   Marital status: Married    Spouse name: Not on file   Number of children: Not on file   Years of education: Not on file    Highest education level: Bachelor's degree (e.g., BA, AB, BS)  Occupational History   Not on file  Tobacco Use   Smoking status: Never   Smokeless tobacco: Never  Vaping Use   Vaping status: Never Used  Substance and Sexual Activity   Alcohol use: Yes    Alcohol/week: 4.0 standard drinks of alcohol    Types: 4 Glasses of wine per week    Comment: Occasional   Drug use: No   Sexual activity: Yes    Partners: Male  Other Topics Concern   Not on file  Social History Narrative   Exercise every day   Social Drivers of Health   Financial Resource Strain: Low Risk  (03/24/2024)   Overall Financial Resource Strain (CARDIA)    Difficulty of Paying Living Expenses: Not very hard  Food Insecurity: No Food Insecurity (03/24/2024)   Hunger Vital Sign    Worried About Running Out of Food in the Last Year: Never true    Ran Out of Food in the Last Year: Never true  Transportation Needs: No Transportation Needs (03/24/2024)   PRAPARE - Administrator, Civil Service (Medical): No    Lack of Transportation (Non-Medical): No  Physical Activity: Sufficiently Active (03/24/2024)   Exercise Vital Sign    Days of Exercise per Week: 7 days    Minutes of Exercise per Session: 80 min  Stress: No Stress Concern Present (03/24/2024)   [employer] of Occupational Health - Occupational Stress Questionnaire    Feeling of Stress: Not at all  Social Connections: Moderately Isolated (03/24/2024)   Social Connection and Isolation Panel    Frequency of Communication with Friends and Family: More than three times a week    Frequency of Social Gatherings with Friends and Family: Twice a week    Attends Religious Services: Never    Database Administrator or Organizations: No    Attends Banker Meetings: Never    Marital Status: Married  Catering Manager Violence: Not At Risk (03/24/2024)   Humiliation, Afraid, Rape, and Kick questionnaire    Fear of Current or Ex-Partner: No     Emotionally Abused: No    Physically Abused: No    Sexually Abused: No  :  Review of Systems  Constitutional: Negative.   HENT: Negative.    Eyes: Negative.  Respiratory: Negative.    Cardiovascular: Negative.   Gastrointestinal: Negative.   Genitourinary: Negative.   Musculoskeletal:  Positive for joint pain.  Skin: Negative.   Neurological: Negative.   Endo/Heme/Allergies: Negative.   Psychiatric/Behavioral: Negative.       Exam:  Vital signs show temperature of 98.2.  Pulse 72.  Blood pressure 117/61.  Weight is 180 pounds.  @IPVITALS @ Physical Exam Vitals reviewed.  HENT:     Head: Normocephalic and atraumatic.  Eyes:     Pupils: Pupils are equal, round, and reactive to light.  Cardiovascular:     Rate and Rhythm: Normal rate and regular rhythm.     Heart sounds: Normal heart sounds.  Pulmonary:     Effort: Pulmonary effort is normal.     Breath sounds: Normal breath sounds.  Abdominal:     General: Bowel sounds are normal.     Palpations: Abdomen is soft.  Musculoskeletal:        General: No tenderness or deformity. Normal range of motion.     Cervical back: Normal range of motion.     Comments: Examination of her left knee shows no swelling.  There is little bit of tenderness to palpation.  She does have little bit of crepitus with range of motion.  She does have good pulses in her distal extremities.  There is no venous cord in the veins.  Lymphadenopathy:     Cervical: No cervical adenopathy.  Skin:    General: Skin is warm and dry.     Findings: No erythema or rash.  Neurological:     Mental Status: She is alert and oriented to person, place, and time.  Psychiatric:        Behavior: Behavior normal.        Thought Content: Thought content normal.        Judgment: Judgment normal.      No results for input(s): WBC, HGB, HCT, PLT in the last 72 hours. No results for input(s): NA, K, CL, CO2, GLUCOSE, BUN, CREATININE, CALCIUM  in the last 72 hours.  Blood smear review: None  Pathology: None    Assessment and Plan: Terri Mendoza is a very nice 78 year old white female.  She is going to have left knee surgery on Monday, November 24.  This will be a knee replacement.  I do think that she will need to have postop anticoagulation.  I also think that it would not be a bad idea to give her some preop anticoagulation.  I would consider Xarelto at 5 mg.  I think would be a good idea to have her on preop Xarelto for 5 days.  I told her to start today and stop after Friday.  She should have Apsley no problems with bleeding gum surgery on Monday.  I then put her on Xarelto 5 mg a day for a month after her surgery.  I think this would be a good amount of time so that she has a decreased risk of thromboembolism.  Even though she is heterozygous for the factor V Leiden mutation, I really think that she needs to be aggressively anticoagulated.  Again, I talked to her about this.  She is certainly okay with having anticoagulation after surgery.  She wants me to check with Dr. Rubie to see if he would mind if she was on anticoagulation before surgery.  At this point I will, I just do not think we have to get her back to be seen unless she does  have a problem with thromboembolism.

## 2024-08-25 DIAGNOSIS — Z96652 Presence of left artificial knee joint: Secondary | ICD-10-CM | POA: Diagnosis not present

## 2024-08-25 DIAGNOSIS — M1712 Unilateral primary osteoarthritis, left knee: Secondary | ICD-10-CM | POA: Diagnosis not present

## 2024-08-25 DIAGNOSIS — G8918 Other acute postprocedural pain: Secondary | ICD-10-CM | POA: Diagnosis not present

## 2024-09-04 DIAGNOSIS — Z471 Aftercare following joint replacement surgery: Secondary | ICD-10-CM | POA: Diagnosis not present

## 2024-09-04 DIAGNOSIS — Z96652 Presence of left artificial knee joint: Secondary | ICD-10-CM | POA: Diagnosis not present

## 2024-09-04 DIAGNOSIS — M25562 Pain in left knee: Secondary | ICD-10-CM | POA: Diagnosis not present

## 2024-09-04 DIAGNOSIS — M25662 Stiffness of left knee, not elsewhere classified: Secondary | ICD-10-CM | POA: Diagnosis not present

## 2024-09-17 ENCOUNTER — Other Ambulatory Visit: Payer: Self-pay | Admitting: Family Medicine
